# Patient Record
Sex: Female | Born: 1960 | Race: White | Hispanic: No | Marital: Married | Smoking: Never smoker
Health system: Southern US, Community
[De-identification: ages and names within clinical notes are randomized; demographics above are authoritative.]

## PROBLEM LIST (undated history)

## (undated) DIAGNOSIS — R002 Palpitations: Secondary | ICD-10-CM

## (undated) DIAGNOSIS — M810 Age-related osteoporosis without current pathological fracture: Secondary | ICD-10-CM

## (undated) DIAGNOSIS — Z1231 Encounter for screening mammogram for malignant neoplasm of breast: Secondary | ICD-10-CM

## (undated) DIAGNOSIS — Z0000 Encounter for general adult medical examination without abnormal findings: Secondary | ICD-10-CM

## (undated) DIAGNOSIS — E785 Hyperlipidemia, unspecified: Secondary | ICD-10-CM

## (undated) DIAGNOSIS — R7303 Prediabetes: Secondary | ICD-10-CM

## (undated) DIAGNOSIS — E039 Hypothyroidism, unspecified: Secondary | ICD-10-CM

## (undated) DIAGNOSIS — K219 Gastro-esophageal reflux disease without esophagitis: Secondary | ICD-10-CM

## (undated) DIAGNOSIS — G2581 Restless legs syndrome: Secondary | ICD-10-CM

## (undated) DIAGNOSIS — G4733 Obstructive sleep apnea (adult) (pediatric): Secondary | ICD-10-CM

## (undated) DIAGNOSIS — F419 Anxiety disorder, unspecified: Secondary | ICD-10-CM

## (undated) DIAGNOSIS — M1990 Unspecified osteoarthritis, unspecified site: Secondary | ICD-10-CM

## (undated) DIAGNOSIS — I10 Essential (primary) hypertension: Secondary | ICD-10-CM

## (undated) DIAGNOSIS — G4730 Sleep apnea, unspecified: Secondary | ICD-10-CM

## (undated) DIAGNOSIS — Z5189 Encounter for other specified aftercare: Secondary | ICD-10-CM

## (undated) HISTORY — DX: Anxiety disorder, unspecified: F41.9

## (undated) HISTORY — PX: BIOPSY RECTAL: PRO29

## (undated) HISTORY — DX: Unspecified osteoarthritis, unspecified site: M19.90

## (undated) HISTORY — DX: Hyperlipidemia, unspecified: E78.5

## (undated) HISTORY — DX: Encounter for other specified aftercare: Z51.89

## (undated) HISTORY — DX: Sleep apnea, unspecified: G47.30

## (undated) HISTORY — DX: Essential (primary) hypertension: I10

## (undated) HISTORY — DX: Gastro-esophageal reflux disease without esophagitis: K21.9

---

## 1991-07-15 HISTORY — PX: OVARIAN CYST SURGERY: SHX726

## 2004-08-06 ENCOUNTER — Ambulatory Visit: Payer: Self-pay | Admitting: Unknown Physician Specialty

## 2005-04-24 ENCOUNTER — Ambulatory Visit: Payer: Self-pay | Admitting: Otolaryngology

## 2005-09-15 ENCOUNTER — Ambulatory Visit: Payer: Self-pay | Admitting: Unknown Physician Specialty

## 2006-11-11 ENCOUNTER — Ambulatory Visit: Payer: Self-pay

## 2007-11-05 DIAGNOSIS — D239 Other benign neoplasm of skin, unspecified: Secondary | ICD-10-CM

## 2007-11-05 HISTORY — DX: Other benign neoplasm of skin, unspecified: D23.9

## 2007-12-02 ENCOUNTER — Ambulatory Visit: Payer: Self-pay

## 2008-12-14 ENCOUNTER — Ambulatory Visit: Payer: Self-pay | Admitting: Family Medicine

## 2009-10-26 DIAGNOSIS — L570 Actinic keratosis: Secondary | ICD-10-CM

## 2009-10-26 HISTORY — DX: Actinic keratosis: L57.0

## 2009-12-25 ENCOUNTER — Ambulatory Visit: Payer: Self-pay | Admitting: Family Medicine

## 2010-02-11 ENCOUNTER — Ambulatory Visit: Payer: Self-pay | Admitting: General Practice

## 2010-09-16 ENCOUNTER — Ambulatory Visit: Payer: Self-pay

## 2010-09-18 ENCOUNTER — Ambulatory Visit: Payer: Self-pay | Admitting: Family Medicine

## 2010-12-30 ENCOUNTER — Ambulatory Visit: Payer: Self-pay | Admitting: Family Medicine

## 2011-03-13 ENCOUNTER — Ambulatory Visit: Payer: Self-pay | Admitting: Otolaryngology

## 2011-04-01 ENCOUNTER — Ambulatory Visit: Payer: Self-pay | Admitting: Otolaryngology

## 2012-01-07 ENCOUNTER — Ambulatory Visit: Payer: Self-pay | Admitting: Family Medicine

## 2012-04-21 LAB — HM PAP SMEAR: HM PAP: NEGATIVE

## 2012-08-10 DIAGNOSIS — N949 Unspecified condition associated with female genital organs and menstrual cycle: Secondary | ICD-10-CM | POA: Insufficient documentation

## 2012-08-10 DIAGNOSIS — R350 Frequency of micturition: Secondary | ICD-10-CM | POA: Insufficient documentation

## 2012-08-10 DIAGNOSIS — N3020 Other chronic cystitis without hematuria: Secondary | ICD-10-CM | POA: Insufficient documentation

## 2012-08-23 ENCOUNTER — Other Ambulatory Visit: Payer: Self-pay | Admitting: Physician Assistant

## 2012-08-23 LAB — TSH: Thyroid Stimulating Horm: 1.13 u[IU]/mL

## 2012-08-25 ENCOUNTER — Ambulatory Visit: Payer: Self-pay | Admitting: General Practice

## 2012-12-31 ENCOUNTER — Ambulatory Visit: Payer: Self-pay | Admitting: Unknown Physician Specialty

## 2013-01-05 LAB — PATHOLOGY REPORT

## 2013-02-01 ENCOUNTER — Ambulatory Visit: Payer: Self-pay | Admitting: Family Medicine

## 2013-02-01 LAB — HM MAMMOGRAPHY

## 2013-02-02 DIAGNOSIS — Z8589 Personal history of malignant neoplasm of other organs and systems: Secondary | ICD-10-CM | POA: Insufficient documentation

## 2013-02-22 DIAGNOSIS — D3A8 Other benign neuroendocrine tumors: Secondary | ICD-10-CM | POA: Insufficient documentation

## 2013-03-17 ENCOUNTER — Ambulatory Visit: Payer: Self-pay | Admitting: Family Medicine

## 2013-05-06 ENCOUNTER — Ambulatory Visit: Payer: Self-pay | Admitting: Family Medicine

## 2013-07-12 DIAGNOSIS — C4491 Basal cell carcinoma of skin, unspecified: Secondary | ICD-10-CM

## 2013-07-12 HISTORY — DX: Basal cell carcinoma of skin, unspecified: C44.91

## 2013-07-14 DIAGNOSIS — B0221 Postherpetic geniculate ganglionitis: Secondary | ICD-10-CM

## 2013-07-14 HISTORY — DX: Postherpetic geniculate ganglionitis: B02.21

## 2013-11-14 LAB — BASIC METABOLIC PANEL
BUN: 14 mg/dL (ref 4–21)
CREATININE: 0.8 mg/dL (ref 0.5–1.1)
Glucose: 84 mg/dL
POTASSIUM: 4.2 mmol/L (ref 3.4–5.3)
SODIUM: 140 mmol/L (ref 137–147)

## 2013-11-14 LAB — LIPID PANEL
CHOLESTEROL: 196 mg/dL (ref 0–200)
HDL: 57 mg/dL (ref 35–70)
LDL Cholesterol: 120 mg/dL
TRIGLYCERIDES: 97 mg/dL (ref 40–160)

## 2013-11-14 LAB — TSH: TSH: 2.57 u[IU]/mL (ref 0.41–5.90)

## 2013-11-14 LAB — HEPATIC FUNCTION PANEL
ALT: 6 U/L — AB (ref 7–35)
AST: 12 U/L — AB (ref 13–35)

## 2014-03-24 ENCOUNTER — Ambulatory Visit: Payer: Self-pay | Admitting: Family Medicine

## 2014-10-11 ENCOUNTER — Encounter: Payer: Self-pay | Admitting: *Deleted

## 2015-01-19 ENCOUNTER — Other Ambulatory Visit: Payer: Self-pay | Admitting: Family Medicine

## 2015-01-19 DIAGNOSIS — E039 Hypothyroidism, unspecified: Secondary | ICD-10-CM

## 2015-01-19 MED ORDER — LEVOTHYROXINE SODIUM 112 MCG PO TABS: 112.0000 ug | ORAL_TABLET | Freq: Every day | ORAL | Status: DC

## 2015-01-22 ENCOUNTER — Other Ambulatory Visit: Payer: Self-pay | Admitting: Family Medicine

## 2015-01-22 DIAGNOSIS — E039 Hypothyroidism, unspecified: Secondary | ICD-10-CM

## 2015-01-22 MED ORDER — LEVOTHYROXINE SODIUM 112 MCG PO TABS: 112.0000 ug | ORAL_TABLET | Freq: Every day | ORAL | Status: DC

## 2015-03-23 ENCOUNTER — Other Ambulatory Visit: Payer: Self-pay | Admitting: Family Medicine

## 2015-03-29 ENCOUNTER — Other Ambulatory Visit: Payer: Self-pay | Admitting: Family Medicine

## 2015-03-29 DIAGNOSIS — Z1231 Encounter for screening mammogram for malignant neoplasm of breast: Secondary | ICD-10-CM

## 2015-04-03 ENCOUNTER — Ambulatory Visit
Admission: RE | Admit: 2015-04-03 | Discharge: 2015-04-03 | Disposition: A | Discharge status: Home / Self Care | Payer: Managed Care, Other (non HMO) | Source: Ambulatory Visit | Attending: Family Medicine | Admitting: Family Medicine

## 2015-04-03 DIAGNOSIS — R922 Inconclusive mammogram: Secondary | ICD-10-CM | POA: Diagnosis not present

## 2015-04-03 DIAGNOSIS — Z1231 Encounter for screening mammogram for malignant neoplasm of breast: Secondary | ICD-10-CM | POA: Diagnosis not present

## 2015-04-04 ENCOUNTER — Other Ambulatory Visit: Payer: Self-pay | Admitting: Family Medicine

## 2015-04-04 DIAGNOSIS — R928 Other abnormal and inconclusive findings on diagnostic imaging of breast: Secondary | ICD-10-CM

## 2015-04-09 ENCOUNTER — Ambulatory Visit
Admission: RE | Admit: 2015-04-09 | Discharge: 2015-04-09 | Disposition: A | Discharge status: Home / Self Care | Payer: Managed Care, Other (non HMO) | Source: Ambulatory Visit | Attending: Family Medicine | Admitting: Family Medicine

## 2015-04-09 ENCOUNTER — Other Ambulatory Visit: Payer: Self-pay

## 2015-04-09 DIAGNOSIS — R928 Other abnormal and inconclusive findings on diagnostic imaging of breast: Secondary | ICD-10-CM

## 2015-04-09 DIAGNOSIS — N6489 Other specified disorders of breast: Secondary | ICD-10-CM | POA: Diagnosis present

## 2015-04-09 DIAGNOSIS — N63 Unspecified lump in breast: Secondary | ICD-10-CM | POA: Diagnosis not present

## 2015-04-10 ENCOUNTER — Other Ambulatory Visit: Payer: Self-pay | Admitting: Family Medicine

## 2015-05-15 DIAGNOSIS — D3A8 Other benign neuroendocrine tumors: Secondary | ICD-10-CM

## 2015-05-15 DIAGNOSIS — E039 Hypothyroidism, unspecified: Secondary | ICD-10-CM | POA: Insufficient documentation

## 2015-05-15 DIAGNOSIS — M1990 Unspecified osteoarthritis, unspecified site: Secondary | ICD-10-CM

## 2015-05-15 DIAGNOSIS — G4730 Sleep apnea, unspecified: Secondary | ICD-10-CM

## 2015-05-15 DIAGNOSIS — G4700 Insomnia, unspecified: Secondary | ICD-10-CM

## 2015-05-15 DIAGNOSIS — K219 Gastro-esophageal reflux disease without esophagitis: Secondary | ICD-10-CM | POA: Insufficient documentation

## 2015-05-15 DIAGNOSIS — F419 Anxiety disorder, unspecified: Secondary | ICD-10-CM | POA: Insufficient documentation

## 2015-05-17 ENCOUNTER — Encounter: Payer: Self-pay | Admitting: *Deleted

## 2015-05-17 ENCOUNTER — Ambulatory Visit
Admission: RE | Admit: 2015-05-17 | Discharge: 2015-05-17 | Disposition: A | Discharge status: Home / Self Care | Payer: Managed Care, Other (non HMO) | Source: Ambulatory Visit | Attending: Internal Medicine | Admitting: Internal Medicine

## 2015-05-17 ENCOUNTER — Encounter: Payer: Self-pay | Admitting: Certified Registered Nurse Anesthetist

## 2015-05-17 ENCOUNTER — Encounter
Admission: RE | Disposition: A | Discharge status: Home / Self Care | Payer: Self-pay | Source: Ambulatory Visit | Attending: Internal Medicine

## 2015-05-17 DIAGNOSIS — Z8719 Personal history of other diseases of the digestive system: Secondary | ICD-10-CM | POA: Insufficient documentation

## 2015-05-17 DIAGNOSIS — Z79899 Other long term (current) drug therapy: Secondary | ICD-10-CM | POA: Diagnosis not present

## 2015-05-17 DIAGNOSIS — K219 Gastro-esophageal reflux disease without esophagitis: Secondary | ICD-10-CM | POA: Insufficient documentation

## 2015-05-17 DIAGNOSIS — Z09 Encounter for follow-up examination after completed treatment for conditions other than malignant neoplasm: Secondary | ICD-10-CM | POA: Diagnosis present

## 2015-05-17 DIAGNOSIS — E785 Hyperlipidemia, unspecified: Secondary | ICD-10-CM | POA: Insufficient documentation

## 2015-05-17 DIAGNOSIS — F419 Anxiety disorder, unspecified: Secondary | ICD-10-CM | POA: Diagnosis not present

## 2015-05-17 HISTORY — PX: EUS: SHX5427

## 2015-05-17 SURGERY — ULTRASOUND, LOWER GI TRACT, ENDOSCOPIC
Anesthesia: General

## 2015-05-17 NOTE — Op Note (Signed)
[facility] Patient Name: Jordan Baker Procedure Date: 05/17/2015 8:07 AM MRN: [id number] Account #: [id number] Date of Birth: 12/13/1960 Admit Type: Outpatient Age: 54 Room: [facility] ENDO ROOM 3 Gender: Female Note Status: Finalized Procedure:         Lower EUS Indications:       Rectal mucosal mass found on flex sig/colonoscopy: history                     of rectal neuroendocrine tumor s/p EMR 2013, For planned                     annual surveillance examination Patient Profile:   Refer to note in patient chart for documentation of                     history and physical. Providers:         Murray Hodgkins. Elvi Leventhal Referring MD:      Jerrell Belfast, MD (Referring MD), Manya Silvas, MD                     (Referring MD) Medicines:         None Complications:     No immediate complications. Procedure:         Pre-Anesthesia Assessment:                    Prior to the procedure, a History and Physical was                     performed, and patient medications and allergies were                     reviewed. The patient is competent. The risks and benefits                     of the procedure and the sedation options and risks were                     discussed with the patient. All questions were answered                     and informed consent was obtained. Patient identification                     and proposed procedure were verified by the physician and                     the nurse in the pre-procedure area. Mental Status                     Examination: alert and oriented. Airway Examination:                     normal oropharyngeal airway and neck mobility. Respiratory                     Examination: clear to auscultation. CV Examination:                     normal. Prophylactic Antibiotics: The patient does not                     require prophylactic antibiotics. Prior Anticoagulants:  The patient has taken no  previous anticoagulant or                     antiplatelet agents. ASA Grade Assessment: II - A patient                     with mild systemic disease. After reviewing the risks and                     benefits, the patient was deemed in satisfactory condition                     to undergo the procedure. The anesthesia plan was to use                     no sedation or anesthesia. Immediately prior to                     administration of medications, the patient was re-assessed                     for adequacy to receive sedatives. The heart rate,                     respiratory rate, oxygen saturations, blood pressure,                     adequacy of pulmonary ventilation, and response to care                     were monitored throughout the procedure. The physical                     status of the patient was re-assessed after the procedure.                    After obtaining informed consent, the endoscope was passed                     under direct vision. Throughout the procedure, the                     patient's blood pressure, pulse, and oxygen saturations                     were monitored continuously. The Endoscope was introduced                     through the anus and advanced to the the sigmoid colon.                     The EUS GI Radial Array [id number] was introduced through the                     anus and advanced to the the sigmoid colon for ultrasound.                     The lower EUS was accomplished without difficulty. The                     patient tolerated the procedure well. The quality of the                     bowel preparation was good. Findings:  The perianal and digital rectal examinations were normal.      Endoscopic Finding :      The rectum, recto-sigmoid colon, sigmoid colon and rectum (on       retroflexion) appeared normal.      Endosonographic Finding :      The anal canal was normal.      The rectum was normal.      The perirectal space was  normal.      No lymph nodes were seen in the perirectal region. Impression:        Flexible Sigmoidoscopy Impressions:                    - The rectum, recto-sigmoid colon and sigmoid colon are                     normal.                    Rectal EUS Impressions:                    - Endosonographic images of the anal canal were                     unremarkable.                    - Endosonographic images of the rectum were unremarkable.                     No evidence of recurrent neuroendocrine tumor.                    - Endosonographic images of the perirectal space were                     unremarkable.                    - No lymph nodes were seen in the perirectal region during                     endosonographic examination.                    - No specimens collected. Recommendation:    - Discharge patient to home (ambulatory).                    - Repeat lower endoscopic ultrasound in 1 year for                     surveillance.                    - The findings and recommendations were discussed with the                     patient.                    - Return to referring physician as previously scheduled. Procedure Code(s): --- Professional ---                    [phone], Sigmoidoscopy, flexible; with endoscopic ultrasound                     examination Diagnosis Code(s): --- Professional ---  K62.89, Other specified diseases of anus and rectum CPT copyright 2014 American Medical Association. All rights reserved. The codes documented in this report are preliminary and upon coder review may  be revised to meet current compliance requirements. Attending Participation:      I personally performed the entire procedure without the assistance of a       fellow, resident or surgical assistant. [place],  05/17/2015 8:27:47 AM This report has been signed electronically. Number of Addenda: 0 Note Initiated On: 05/17/2015 8:07 AM      [facility]

## 2015-05-17 NOTE — OR Nursing (Signed)
Pt. Requested no anesthesia. Tolerated well.

## 2015-05-17 NOTE — H&P (Signed)
Jordan Baker is an 54 y.o. female.    Pre-procedure H&P for:  Surveillance of rectal neuroendocrine tumor   Past Medical History  Diagnosis Date  . Anxiety   . GERD (gastroesophageal reflux disease)   . Hyperlipidemia     Past Surgical History  Procedure Laterality Date  . Ovarian cyst surgery  1993  . Cesarean section  1990/1993    x2  . Biopsy rectal      Family History  Problem Relation Age of Onset  . Breast cancer Paternal Grandmother   . Cancer Paternal Grandmother     Breast Cancer  . Hyperlipidemia Mother   . Hypertension Mother   . Hypertension Sister   . Arthritis Maternal Grandfather    Social History:  reports that she has never smoked. She has never used smokeless tobacco. She reports that she drinks alcohol. She reports that she does not use illicit drugs.  Allergies:  Allergies  Allergen Reactions  . Nitrofurantoin Monohyd Macro Rash    Medications Prior to Admission  Medication Sig Dispense Refill  . ALPRAZolam (XANAX) 0.5 MG tablet Take 0.5 mg by mouth.    Marland Kitchen amitriptyline (ELAVIL) 10 MG tablet Take by mouth.    . cetirizine (ZYRTEC) 10 MG tablet Take by mouth.    . levonorgestrel-ethinyl estradiol (AVIANE) 0.1-20 MG-MCG tablet     . levonorgestrel-ethinyl estradiol (ENPRESSE,TRIVORA) tablet Take by mouth.    . levothyroxine (SYNTHROID, LEVOTHROID) 112 MCG tablet Take 1 tablet (112 mcg total) by mouth daily. 90 tablet 0  . Multiple Vitamin (MULTI-VITAMINS) TABS Take by mouth.    . Omega-3 1000 MG CAPS Take by mouth.    Marland Kitchen omeprazole (PRILOSEC) 20 MG capsule Take by mouth.    . pravastatin (PRAVACHOL) 40 MG tablet TAKE ONE TABLET BY MOUTH AT BEDTIME *TIME FOR OFFICE VISIT* 90 tablet 0  . tiZANidine (ZANAFLEX) 4 MG tablet Take 4 mg by mouth.    . Calcium Carbonate-Vitamin D (CALCIUM 500 + D) 500-125 MG-UNIT TABS Take by mouth.    . celecoxib (CELEBREX) 200 MG capsule Take by mouth.    . doxycycline (MONODOX) 100 MG capsule Take by mouth.    .  doxycycline (VIBRA-TABS) 100 MG tablet Take 100 mg by mouth.    . pregabalin (LYRICA) 50 MG capsule Take 50 mg by mouth.      No results found for this or any previous visit (from the past 48 hour(s)). No results found.  ROS  Blood pressure 150/77, pulse 86, temperature 98.7 F (37.1 C), temperature source Oral, resp. rate 20, height 5\' 2"  (1.575 m), weight 69.4 kg (153 lb), last menstrual period 05/17/2015. Physical Exam  Heart: RRR, no murmurs/rubs/gallops Resp: Clear to auscultation bilaterally Abd: soft, NT, ND, normoactive bowel sounds  Assessment/Plan 1. Plan for surveillance rectal endoscopic ultrasound  Tillie Rung 05/17/2015, 7:55 AM

## 2015-05-31 ENCOUNTER — Ambulatory Visit (INDEPENDENT_AMBULATORY_CARE_PROVIDER_SITE_OTHER): Payer: Managed Care, Other (non HMO) | Admitting: Physician Assistant

## 2015-05-31 ENCOUNTER — Encounter: Payer: Self-pay | Admitting: Physician Assistant

## 2015-05-31 VITALS — BP 150/80 | HR 81 | Temp 98.5°F | Resp 16 | Ht 62.0 in | Wt 152.6 lb

## 2015-05-31 DIAGNOSIS — E7800 Pure hypercholesterolemia, unspecified: Secondary | ICD-10-CM | POA: Diagnosis not present

## 2015-05-31 DIAGNOSIS — Z3041 Encounter for surveillance of contraceptive pills: Secondary | ICD-10-CM

## 2015-05-31 DIAGNOSIS — E039 Hypothyroidism, unspecified: Secondary | ICD-10-CM

## 2015-05-31 DIAGNOSIS — M62830 Muscle spasm of back: Secondary | ICD-10-CM | POA: Diagnosis not present

## 2015-05-31 DIAGNOSIS — F419 Anxiety disorder, unspecified: Secondary | ICD-10-CM

## 2015-05-31 DIAGNOSIS — M792 Neuralgia and neuritis, unspecified: Secondary | ICD-10-CM

## 2015-05-31 DIAGNOSIS — D259 Leiomyoma of uterus, unspecified: Secondary | ICD-10-CM | POA: Diagnosis not present

## 2015-05-31 DIAGNOSIS — K219 Gastro-esophageal reflux disease without esophagitis: Secondary | ICD-10-CM

## 2015-05-31 DIAGNOSIS — J302 Other seasonal allergic rhinitis: Secondary | ICD-10-CM

## 2015-05-31 DIAGNOSIS — D219 Benign neoplasm of connective and other soft tissue, unspecified: Secondary | ICD-10-CM | POA: Insufficient documentation

## 2015-05-31 DIAGNOSIS — M1990 Unspecified osteoarthritis, unspecified site: Secondary | ICD-10-CM | POA: Diagnosis not present

## 2015-05-31 DIAGNOSIS — N951 Menopausal and female climacteric states: Secondary | ICD-10-CM | POA: Insufficient documentation

## 2015-05-31 DIAGNOSIS — Z0000 Encounter for general adult medical examination without abnormal findings: Secondary | ICD-10-CM | POA: Diagnosis not present

## 2015-05-31 MED ORDER — LEVOTHYROXINE SODIUM 112 MCG PO TABS: 112.0000 ug | ORAL_TABLET | Freq: Every day | ORAL | Status: DC

## 2015-05-31 MED ORDER — CELECOXIB 200 MG PO CAPS: 200.0000 mg | ORAL_CAPSULE | Freq: Every day | ORAL | Status: AC

## 2015-05-31 MED ORDER — ALPRAZOLAM 0.5 MG PO TABS: 0.5000 mg | ORAL_TABLET | Freq: Every evening | ORAL | Status: DC | PRN

## 2015-05-31 MED ORDER — OMEPRAZOLE 20 MG PO CPDR: 20.0000 mg | DELAYED_RELEASE_CAPSULE | Freq: Every day | ORAL | Status: DC

## 2015-05-31 MED ORDER — AMITRIPTYLINE HCL 25 MG PO TABS: 25.0000 mg | ORAL_TABLET | Freq: Every day | ORAL | Status: DC

## 2015-05-31 MED ORDER — PRAVASTATIN SODIUM 40 MG PO TABS: 40.0000 mg | ORAL_TABLET | Freq: Every day | ORAL | Status: DC

## 2015-05-31 MED ORDER — LEVONORGESTREL-ETHINYL ESTRAD 0.1-20 MG-MCG PO TABS: 1.0000 | ORAL_TABLET | Freq: Every day | ORAL | Status: DC

## 2015-05-31 MED ORDER — FLUTICASONE PROPIONATE 50 MCG/ACT NA SUSP: 2.0000 | Freq: Every day | NASAL | Status: DC

## 2015-05-31 MED ORDER — CYCLOBENZAPRINE HCL 10 MG PO TABS: 10.0000 mg | ORAL_TABLET | ORAL | Status: DC | PRN

## 2015-05-31 NOTE — Progress Notes (Signed)
Patient: Jordan Baker, Female    DOB: 16-Jul-1960, 54 y.o.   MRN: [id number] Visit Date: 05/31/2015  Today's Provider: Mar Daring, PA-C   Chief Complaint  Patient presents with  . Annual Exam   Subjective:    Annual physical exam Jordan Baker is a 54 y.o. female who presents today for health maintenance and complete physical. She feels well. She reports exercising, a lot of walking 3 times a week. She reports she is sleeping well since patient got CPAP device. She is requesting refills of her medications.   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 She has had annual screening since this. She is followed by Dr. Tillie Rung. She recently underwent a rectal ultrasound November 2016.    Review of Systems  Constitutional: Negative.   HENT: Negative.   Eyes: Negative.   Respiratory: Negative.   Cardiovascular: Negative.   Gastrointestinal: Negative.   Endocrine: Negative.   Genitourinary: Negative.   Musculoskeletal: Positive for joint swelling.  Skin: Negative.   Allergic/Immunologic: Negative.   Neurological: Negative.   Hematological: Negative.   Psychiatric/Behavioral: The patient is nervous/anxious.     Social History She  reports that she has never smoked. She has never used smokeless tobacco. She reports that she drinks alcohol. She reports that she does not use illicit drugs. Social History   Social History  . Marital Status: Married    Spouse Name: N/A  . Number of Children: N/A  . Years of Education: N/A   Social History Main Topics  . Smoking status: Never Smoker   . Smokeless tobacco: Never Used  . Alcohol Use: Yes     Comment: Occasional  . Drug Use: No  . Sexual Activity: Not Asked   Other Topics Concern  . None   Social History Narrative    Patient Active Problem List   Diagnosis Date Noted  . Fibroid 05/31/2015  . Nerve pain 05/31/2015  . Hypercholesterolemia 05/31/2015  . Hot flash, menopausal 05/31/2015  . GERD (gastroesophageal reflux disease) 05/15/2015  . Arthritis 05/15/2015  . Hypothyroidism 05/15/2015  . Anxiety disorder 05/15/2015  . Sleep apnea 05/15/2015  . Insomnia 05/15/2015  . Neuroendocrine tumor 02/22/2013  . 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 Bladder infection, chronic 08/10/2012  . Female genital symptoms 08/10/2012    Past Surgical History  Procedure Laterality Date  . Ovarian cyst surgery  1993  . Cesarean section  1990/1993    x2  . Biopsy rectal    . Eus N/A 05/17/2015    Procedure: LOWER ENDOSCOPIC ULTRASOUND (EUS);  Surgeon: Holly Bodily, MD;  Location: [facility] ENDOSCOPY;  Service: Gastroenterology;  Laterality: N/A;    Family History  Family Status  Relation Status Death Age  . Paternal Grandmother Deceased 57    died from old age  . Mother Alive   . Father Deceased 7    Lung cancer  . Sister Alive   . Maternal Grandmother Deceased 77    died from old age  . Maternal Grandfather Deceased 27's    died from [place] heart failure.  . Paternal Grandfather Deceased 48    died from old age  . Sister Alive    Her family history includes Arthritis in her maternal grandfather; Breast cancer in her paternal grandmother; Cancer in her paternal grandmother; Hyperlipidemia in her mother; Hypertension in her mother  and sister.    Allergies  Allergen Reactions  . Nitrofurantoin Monohyd Macro Rash    Previous Medications   ALPRAZOLAM (XANAX) 0.5 MG TABLET    Take 0.5 mg by mouth.   AMITRIPTYLINE (ELAVIL) 25 MG TABLET       CALCIUM CARBONATE-VITAMIN D (CALCIUM 500 + D) 500-125 MG-UNIT TABS    Take by mouth.   CELECOXIB (CELEBREX) 200 MG CAPSULE    Take by mouth.   CETIRIZINE (ZYRTEC) 10 MG TABLET    Take by mouth.   CYCLOBENZAPRINE (FLEXERIL) 10 MG TABLET    Take by mouth.   DOXYCYCLINE (VIBRA-TABS)  100 MG TABLET    Take 100 mg by mouth.   LEVONORGESTREL-ETHINYL ESTRADIOL (AVIANE) 0.1-20 MG-MCG TABLET       LEVONORGESTREL-ETHINYL ESTRADIOL (ENPRESSE,TRIVORA) TABLET    Take by mouth.   LEVOTHYROXINE (SYNTHROID, LEVOTHROID) 112 MCG TABLET    Take 1 tablet (112 mcg total) by mouth daily.   MULTIPLE VITAMIN (MULTI-VITAMINS) TABS    Take by mouth.   NON FORMULARY    CPAP (Device) nightly   OMEGA-3 1000 MG CAPS    Take by mouth.   OMEPRAZOLE (PRILOSEC) 20 MG CAPSULE    Take by mouth.   PRAVASTATIN (PRAVACHOL) 40 MG TABLET    TAKE ONE TABLET BY MOUTH AT BEDTIME *TIME FOR OFFICE VISIT*   TIZANIDINE (ZANAFLEX) 4 MG TABLET    Take 4 mg by mouth.    Patient Care Team: Margarita Rana, MD as PCP - General (Family Medicine)     Objective:   Vitals: BP 150/80 mmHg  Pulse 81  Temp(Src) 98.5 F (36.9 C) (Oral)  Resp 16  Ht 5\' 2"  (1.575 m)  Wt 152 lb 9.6 oz (69.219 kg)  BMI 27.90 kg/m2  LMP 05/17/2015   Physical Exam  Constitutional: She is oriented to person, place, and time. She appears well-developed and well-nourished. No distress.  HENT:  Head: Normocephalic and atraumatic.  Right Ear: Hearing, tympanic membrane, external ear and ear canal normal.  Left Ear: Hearing, tympanic membrane, external ear and ear canal normal.  Nose: Mucosal edema present.  Mouth/Throat: Uvula is midline, oropharynx is clear and moist and mucous membranes are normal. No oropharyngeal exudate.  Eyes: Conjunctivae and EOM are normal. Pupils are equal, round, and reactive to light. Right eye exhibits no discharge. Left eye exhibits no discharge. No scleral icterus.  Neck: Normal range of motion. Neck supple. No JVD present. No tracheal deviation present. No thyromegaly present.  Cardiovascular: Normal rate, regular rhythm, normal heart sounds and intact distal pulses.  Exam reveals no gallop and no friction rub.   No murmur heard. Pulmonary/Chest: Effort normal and breath sounds normal. No respiratory distress.  She has no wheezes. She has no rales. She exhibits no tenderness.  Abdominal: Soft. Bowel sounds are normal. She exhibits no distension and no mass. There is no tenderness. There is no rebound and no guarding.  Musculoskeletal: Normal range of motion. She exhibits no edema or tenderness.  Lymphadenopathy:    She has no cervical adenopathy.  Neurological: She is alert and oriented to person, place, and time.  Skin: Skin is warm and dry. No rash noted. She is not diaphoretic.  Psychiatric: She has a normal mood and affect. Her behavior is normal. Judgment and thought content normal.  Vitals reviewed.    Depression Screen No flowsheet data found.    Assessment & Plan:     Routine Health Maintenance and Physical Exam  1. Annual physical  exam Physical exam today was normal. We will check labs as below. I will follow-up with her pending lab results. If all labs are within normal limits able I will see her back in one year for her repeat physical exam. - CBC with Differential - Comprehensive metabolic panel - Hemoglobin A1c - Lipid panel - TSH  2. Gastroesophageal reflux disease, esophagitis presence not specified Currently stable on current medical treatment plan. Continue Prilosec as directed. Medication was refilled as below today. - omeprazole (PRILOSEC) 20 MG capsule; Take 1 capsule (20 mg total) by mouth daily.  Dispense: 90 capsule; Refill: 3  3. Hypothyroidism, unspecified hypothyroidism type Currently stable on levothyroxine 112 g. We will check TSH levels again today. I will follow-up pending the results of the labs. Levothyroxine was refilled at current dose as below. If needed we will adjust dosage as necessary. - levothyroxine (SYNTHROID, LEVOTHROID) 112 MCG tablet; Take 1 tablet (112 mcg total) by mouth daily.  Dispense: 90 tablet; Refill: 3  4. Nerve pain This was secondary to nerve damage done during removal of a tooth. She takes amitriptyline for the nerve pain. She  states that she will take half a tab to one tablet at night to help with the nerve pain. This medication was refilled as below. - amitriptyline (ELAVIL) 25 MG tablet; Take 1 tablet (25 mg total) by mouth at bedtime.  Dispense: 90 tablet; Refill: 3  5. Uterine leiomyoma, unspecified location History of uterine fibroids. She currently takes oral contraceptives to help with heavy menses secondary to the fibroid. She has been stable with this current regimen dose. We will continue to monitor her for menopausal symptoms. At that time we will discontinue the oral contraceptives.  6. Hypercholesterolemia Currently stable on pravastatin 40 mg. I will check her cholesterol today. I will follow-up with her pending results. If results are stable I will see her back in one year for her repeat physical exam. - pravastatin (PRAVACHOL) 40 MG tablet; Take 1 tablet (40 mg total) by mouth daily.  Dispense: 90 tablet; Refill: 3 - Comprehensive metabolic panel - Lipid panel  7. Anxiety disorder, unspecified anxiety disorder type Currently stable with Xanax as needed. She mostly uses the Xanax to help her sleep at night. 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 This is refilled as below. I will follow-up with her in one year for repeat physical exam. She may call the office if symptoms worsen in the meantime. - ALPRAZolam (XANAX) 0.5 MG tablet; Take 1 tablet (0.5 mg total) by mouth at bedtime as needed for anxiety.  Dispense: 90 tablet; Refill: 3  8. Arthritis Currently stable with Celebrex. Continue current treatment. Medication was refilled as below. I will see her back in one year for repeat physical exam. She is to call the office if symptoms worsen in the meantime. - celecoxib (CELEBREX) 200 MG capsule; Take 1 capsule (200 mg total) by mouth daily.  Dispense: 90 capsule; Refill: 3  9. 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 This causes occasional back spasms that affect her sleep. She occasionally will use Flexeril as needed to help her sleep when this occurs. Flexeril was refilled as below. She is to call the office if this worsens in the meantime. - cyclobenzaprine (FLEXERIL) 10 MG tablet; Take 1 tablet (10 mg total) by mouth as needed for  muscle spasms.  Dispense: 30 tablet; Refill: 3  10. Encounter for surveillance of contraceptive pills Oral contraceptive uses for uterine fibroids and heavy menstrual cycles. This been stable and well controlled with the current oral contraceptive as below. This medication was refilled as below. I will follow-up with her in one year for her repeat physical exam. She is to call the office if she starts to develop any menopausal symptoms. - levonorgestrel-ethinyl estradiol (AVIANE) 0.1-20 MG-MCG tablet; Take 1 tablet by mouth daily.  Dispense: 3 Package; Refill: 3  11. Seasonal allergies Currently taking Zyrtec. She does have some nasal mucosa edema noted on exam. This is most likely secondary to seasonal allergies as well as use of her CPAP at night. I did advise her to start using Flonase to see if this helps the edema. She is to call the office if symptoms fail to improve or worsen. - fluticasone (FLONASE) 50 MCG/ACT nasal spray; Place 2 sprays into both nostrils daily.  Dispense: 16 g; Refill: 6  Exercise Activities and Dietary recommendations Goals    None       There is no immunization history on file for this patient.  Health Maintenance  Topic Date Due  . Hepatitis C Screening  [birth year]  . HIV Screening  02/08/1977  . TETANUS/TDAP  02/08/1981  . COLONOSCOPY  02/09/2012  . INFLUENZA VACCINE  02/12/2015  . PAP SMEAR  04/22/2015  . MAMMOGRAM  04/02/2017      Discussed health benefits of physical activity, and encouraged her to engage in regular exercise appropriate for her age and condition.     --------------------------------------------------------------------

## 2015-05-31 NOTE — Patient Instructions (Signed)
Health Maintenance, Female Adopting a healthy lifestyle and getting preventive care can go a long way to promote health and wellness. Talk with your health care provider about what schedule of regular examinations is right for you. This is a good chance for you to check in with your provider about disease prevention and staying healthy. In between checkups, there are plenty of things you can do on your own. Experts have done a lot of research about which lifestyle changes and preventive measures are most likely to keep you healthy. Ask your health care provider for more information. WEIGHT AND DIET  Eat a healthy diet  Be sure to include plenty of vegetables, fruits, low-fat dairy products, and lean protein.  Do not eat a lot of foods high in solid fats, added sugars, or salt.  Get regular exercise. This is one of the most important things you can do for your health.  Most adults should exercise for at least 150 minutes each week. The exercise should increase your heart rate and make you sweat (moderate-intensity exercise).  Most adults should also do strengthening exercises at least twice a week. This is in addition to the moderate-intensity exercise.  Maintain a healthy weight  Body mass index (BMI) is a measurement that can be used to identify possible weight problems. It estimates body fat based on height and weight. Your health care provider can help determine your BMI and help you achieve or maintain a healthy weight.  For females 20 years of age and older:   A BMI below 18.5 is considered underweight.  A BMI of 18.5 to 24.9 is normal.  A BMI of 25 to 29.9 is considered overweight.  A BMI of 30 and above is considered obese.  Watch levels of cholesterol and blood lipids  You should start having your blood tested for lipids and cholesterol at 53 years of age, then have this test every 5 years.  You may need to have your cholesterol levels checked more often if:  Your lipid  or cholesterol levels are high.  You are older than 53 years of age.  You are at high risk for heart disease.  CANCER SCREENING   Lung Cancer  Lung cancer screening is recommended for adults 55-80 years old who are at high risk for lung cancer because of a history of smoking.  A yearly low-dose CT scan of the lungs is recommended for people who:  Currently smoke.  Have quit within the past 15 years.  Have at least a 30-pack-year history of smoking. A pack year is smoking an average of one pack of cigarettes a day for 1 year.  Yearly screening should continue until it has been 15 years since you quit.  Yearly screening should stop if you develop a health problem that would prevent you from having lung cancer treatment.  Breast Cancer  Practice breast self-awareness. This means understanding how your breasts normally appear and feel.  It also means doing regular breast self-exams. Let your health care provider know about any changes, no matter how small.  If you are in your 20s or 30s, you should have a clinical breast exam (CBE) by a health care provider every 1-3 years as part of a regular health exam.  If you are 40 or older, have a CBE every year. Also consider having a breast X-ray (mammogram) every year.  If you have a family history of breast cancer, talk to your health care provider about genetic screening.  If you   are at high risk for breast cancer, talk to your health care provider about having an MRI and a mammogram every year.  Breast cancer gene (BRCA) assessment is recommended for women who have family members with BRCA-related cancers. BRCA-related cancers include:  Breast.  Ovarian.  Tubal.  Peritoneal cancers.  Results of the assessment will determine the need for genetic counseling and BRCA1 and BRCA2 testing. Cervical Cancer Your health care provider may recommend that you be screened regularly for cancer of the pelvic organs (ovaries, uterus, and  vagina). This screening involves a pelvic examination, including checking for microscopic changes to the surface of your cervix (Pap test). You may be encouraged to have this screening done every 3 years, beginning at age 21.  For women ages 30-65, health care providers may recommend pelvic exams and Pap testing every 3 years, or they may recommend the Pap and pelvic exam, combined with testing for human papilloma virus (HPV), every 5 years. Some types of HPV increase your risk of cervical cancer. Testing for HPV may also be done on women of any age with unclear Pap test results.  Other health care providers may not recommend any screening for nonpregnant women who are considered low risk for pelvic cancer and who do not have symptoms. Ask your health care provider if a screening pelvic exam is right for you.  If you have had past treatment for cervical cancer or a condition that could lead to cancer, you need Pap tests and screening for cancer for at least 20 years after your treatment. If Pap tests have been discontinued, your risk factors (such as having a new sexual partner) need to be reassessed to determine if screening should resume. Some women have medical problems that increase the chance of getting cervical cancer. In these cases, your health care provider may recommend more frequent screening and Pap tests. Colorectal Cancer  This type of cancer can be detected and often prevented.  Routine colorectal cancer screening usually begins at 53 years of age and continues through 53 years of age.  Your health care provider may recommend screening at an earlier age if you have risk factors for colon cancer.  Your health care provider may also recommend using home test kits to check for hidden blood in the stool.  A small camera at the end of a tube can be used to examine your colon directly (sigmoidoscopy or colonoscopy). This is done to check for the earliest forms of colorectal  cancer.  Routine screening usually begins at age 50.  Direct examination of the colon should be repeated every 5-10 years through 53 years of age. However, you may need to be screened more often if early forms of precancerous polyps or small growths are found. Skin Cancer  Check your skin from head to toe regularly.  Tell your health care provider about any new moles or changes in moles, especially if there is a change in a mole's shape or color.  Also tell your health care provider if you have a mole that is larger than the size of a pencil eraser.  Always use sunscreen. Apply sunscreen liberally and repeatedly throughout the day.  Protect yourself by wearing long sleeves, pants, a wide-brimmed hat, and sunglasses whenever you are outside. HEART DISEASE, DIABETES, AND HIGH BLOOD PRESSURE   High blood pressure causes heart disease and increases the risk of stroke. High blood pressure is more likely to develop in:  People who have blood pressure in the high end   of the normal range (130-139/85-89 mm Hg).  People who are overweight or obese.  People who are African American.  If you are 38-23 years of age, have your blood pressure checked every 3-5 years. If you are 61 years of age or older, have your blood pressure checked every year. You should have your blood pressure measured twice--once when you are at a hospital or clinic, and once when you are not at a hospital or clinic. Record the average of the two measurements. To check your blood pressure when you are not at a hospital or clinic, you can use:  An automated blood pressure machine at a pharmacy.  A home blood pressure monitor.  If you are between 45 years and 39 years old, ask your health care provider if you should take aspirin to prevent strokes.  Have regular diabetes screenings. This involves taking a blood sample to check your fasting blood sugar level.  If you are at a normal weight and have a low risk for diabetes,  have this test once every three years after 53 years of age.  If you are overweight and have a high risk for diabetes, consider being tested at a younger age or more often. PREVENTING INFECTION  Hepatitis B  If you have a higher risk for hepatitis B, you should be screened for this virus. You are considered at high risk for hepatitis B if:  You were born in a country where hepatitis B is common. Ask your health care provider which countries are considered high risk.  Your parents were born in a high-risk country, and you have not been immunized against hepatitis B (hepatitis B vaccine).  You have HIV or AIDS.  You use needles to inject street drugs.  You live with someone who has hepatitis B.  You have had sex with someone who has hepatitis B.  You get hemodialysis treatment.  You take certain medicines for conditions, including cancer, organ transplantation, and autoimmune conditions. Hepatitis C  Blood testing is recommended for:  Everyone born from [birth year] through 1964.  Anyone with known risk factors for hepatitis C. Sexually transmitted infections (STIs)  You should be screened for sexually transmitted infections (STIs) including gonorrhea and chlamydia if:  You are sexually active and are younger than 53 years of age.  You are older than 53 years of age and your health care provider tells you that you are at risk for this type of infection.  Your sexual activity has changed since you were last screened and you are at an increased risk for chlamydia or gonorrhea. Ask your health care provider if you are at risk.  If you do not have HIV, but are at risk, it may be recommended that you take a prescription medicine daily to prevent HIV infection. This is called pre-exposure prophylaxis (PrEP). You are considered at risk if:  You are sexually active and do not regularly use condoms or know the HIV status of your partner(s).  You take drugs by injection.  You are sexually  active with a partner who has HIV. Talk with your health care provider about whether you are at high risk of being infected with HIV. If you choose to begin PrEP, you should first be tested for HIV. You should then be tested every 3 months for as long as you are taking PrEP.  PREGNANCY   If you are premenopausal and you may become pregnant, ask your health care provider about preconception counseling.  If you may  become pregnant, take 400 to 800 micrograms (mcg) of folic acid every day.  If you want to prevent pregnancy, talk to your health care provider about birth control (contraception). OSTEOPOROSIS AND MENOPAUSE   Osteoporosis is a disease in which the bones lose minerals and strength with aging. This can result in serious bone fractures. Your risk for osteoporosis can be identified using a bone density scan.  If you are 61 years of age or older, or if you are at risk for osteoporosis and fractures, ask your health care provider if you should be screened.  Ask your health care provider whether you should take a calcium or vitamin D supplement to lower your risk for osteoporosis.  Menopause may have certain physical symptoms and risks.  Hormone replacement therapy may reduce some of these symptoms and risks. Talk to your health care provider about whether hormone replacement therapy is right for you.  HOME CARE INSTRUCTIONS   Schedule regular health, dental, and eye exams.  Stay current with your immunizations.   Do not use any tobacco products including cigarettes, chewing tobacco, or electronic cigarettes.  If you are pregnant, do not drink alcohol.  If you are breastfeeding, limit how much and how often you drink alcohol.  Limit alcohol intake to no more than 1 drink per day for nonpregnant women. One drink equals 12 ounces of beer, 5 ounces of wine, or 1 ounces of hard liquor.  Do not use street drugs.  Do not share needles.  Ask your health care provider for help if  you need support or information about quitting drugs.  Tell your health care provider if you often feel depressed.  Tell your health care provider if you have ever been abused or do not feel safe at home.   This information is not intended to replace advice given to you by your health care provider. Make sure you discuss any questions you have with your health care provider.   Document Released: 01/13/2011 Document Revised: 07/21/2014 Document Reviewed: 06/01/2013 Elsevier Interactive Patient Education [employer].

## 2015-06-01 MED ORDER — CYCLOBENZAPRINE HCL 10 MG PO TABS
10.0000 mg | ORAL_TABLET | Freq: Three times a day (TID) | ORAL | Status: DC | PRN
Start: 2015-06-01 — End: 2017-07-23

## 2015-06-01 NOTE — Addendum Note (Signed)
Addended by: Mar Daring on: 06/01/2015 04:30 PM   Modules accepted: Orders

## 2015-06-06 LAB — CBC WITH DIFFERENTIAL/PLATELET
BASOS: 1 %
Basophils Absolute: 0 10*3/uL (ref 0.0–0.2)
EOS (ABSOLUTE): 0.1 10*3/uL (ref 0.0–0.4)
Eos: 2 %
Hematocrit: 36.8 % (ref 34.0–46.6)
Hemoglobin: 12.3 g/dL (ref 11.1–15.9)
Immature Grans (Abs): 0 10*3/uL (ref 0.0–0.1)
Immature Granulocytes: 0 %
Lymphocytes Absolute: 1.6 10*3/uL (ref 0.7–3.1)
Lymphs: 29 %
MCH: 29.1 pg (ref 26.6–33.0)
MCHC: 33.4 g/dL (ref 31.5–35.7)
MCV: 87 fL (ref 79–97)
MONOS ABS: 0.3 10*3/uL (ref 0.1–0.9)
Monocytes: 5 %
NEUTROS ABS: 3.4 10*3/uL (ref 1.4–7.0)
NEUTROS PCT: 63 %
PLATELETS: 328 10*3/uL (ref 150–379)
RBC: 4.22 x10E6/uL (ref 3.77–5.28)
RDW: 13.5 % (ref 12.3–15.4)
WBC: 5.4 10*3/uL (ref 3.4–10.8)

## 2015-06-06 LAB — COMPREHENSIVE METABOLIC PANEL
A/G RATIO: 1.8 (ref 1.1–2.5)
ALT: 10 IU/L (ref 0–32)
AST: 14 IU/L (ref 0–40)
Albumin: 4.3 g/dL (ref 3.5–5.5)
Alkaline Phosphatase: 66 IU/L (ref 39–117)
BILIRUBIN TOTAL: 0.3 mg/dL (ref 0.0–1.2)
BUN/Creatinine Ratio: 15 (ref 9–23)
BUN: 12 mg/dL (ref 6–24)
CO2: 23 mmol/L (ref 18–29)
Calcium: 9.1 mg/dL (ref 8.7–10.2)
Chloride: 100 mmol/L (ref 97–106)
Creatinine, Ser: 0.82 mg/dL (ref 0.57–1.00)
GFR calc Af Amer: 94 mL/min/{1.73_m2} (ref >59)
GFR calc non Af Amer: 82 mL/min/{1.73_m2} (ref >59)
Globulin, Total: 2.4 g/dL (ref 1.5–4.5)
Glucose: 84 mg/dL (ref 65–99)
POTASSIUM: 4.1 mmol/L (ref 3.5–5.2)
Sodium: 138 mmol/L (ref 136–144)
Total Protein: 6.7 g/dL (ref 6.0–8.5)

## 2015-06-06 LAB — LIPID PANEL
CHOL/HDL RATIO: 4.2 ratio (ref 0.0–4.4)
Cholesterol, Total: 232 mg/dL — ABNORMAL HIGH (ref 100–199)
HDL: 55 mg/dL (ref >39)
LDL CALC: 160 mg/dL — AB (ref 0–99)
TRIGLYCERIDES: 84 mg/dL (ref 0–149)
VLDL Cholesterol Cal: 17 mg/dL (ref 5–40)

## 2015-06-06 LAB — HEMOGLOBIN A1C
Est. average glucose Bld gHb Est-mCnc: 120 mg/dL
HEMOGLOBIN A1C: 5.8 % — AB (ref 4.8–5.6)

## 2015-06-06 LAB — TSH: TSH: 5.71 u[IU]/mL — AB (ref 0.450–4.500)

## 2015-06-08 ENCOUNTER — Telehealth: Payer: Self-pay

## 2015-06-08 DIAGNOSIS — E039 Hypothyroidism, unspecified: Secondary | ICD-10-CM

## 2015-06-08 MED ORDER — LEVOTHYROXINE SODIUM 125 MCG PO TABS: 125.0000 ug | ORAL_TABLET | Freq: Every day | ORAL | Status: DC

## 2015-06-08 NOTE — Telephone Encounter (Signed)
-----   Message from Mar Daring, [place] sent at 06/06/2015  4:45 PM EST ----- Blood count, kidney and liver function were all WNL and stable.  Cholesterol is borderline elevated at 232, LDL is 160.  Try to limit high fat, high cholesterol foods in diet.  Continue fish oil.  Add daily exercise for 30-40 minutes daily x 3-4 days per week.  Make sure to be taking pravastatin daily as well.  Hemoglobin A1c is also borderline elevated at 5.8.  Limit carbohydrates and sugars in diet as well. We will recheck labs in one year.  Form for work has been filled out as well and placed up front for pick up.

## 2015-06-08 NOTE — Telephone Encounter (Signed)
It was only borderline high but since she is symptomatic we can increase the dose to 118mcg.  I have sent this new Rx to [facility] [address].  We can recheck her tsh in 3 months to see how the level is at that time.  Order for future TSh has been placed as well.

## 2015-06-08 NOTE — Telephone Encounter (Signed)
Patient advised as directed below. Per patient wants to know if she needs to increase her thyroid medicines because her Thyroid level is a little high.Patient went to [facility] and saw her results. Per patient feeling tired, hair is falling out, feels constipated, and feels like is gaining weight. Per patient this usually happens when her thyroid levels goes high.  Please advise.  Thanks,  -Joseline

## 2015-06-11 NOTE — Telephone Encounter (Signed)
Patient advised as directed below. Patient advised to called before going to the lab.  Thanks,  -Kazuo Durnil

## 2015-07-18 ENCOUNTER — Ambulatory Visit: Payer: Self-pay | Admitting: Physician Assistant

## 2015-07-18 ENCOUNTER — Encounter: Payer: Self-pay | Admitting: Physician Assistant

## 2015-07-18 VITALS — BP 140/78 | Temp 98.3°F

## 2015-07-18 DIAGNOSIS — R300 Dysuria: Secondary | ICD-10-CM

## 2015-07-18 DIAGNOSIS — N390 Urinary tract infection, site not specified: Secondary | ICD-10-CM

## 2015-07-18 DIAGNOSIS — Z299 Encounter for prophylactic measures, unspecified: Secondary | ICD-10-CM

## 2015-07-18 LAB — POCT URINALYSIS DIPSTICK
BILIRUBIN UA: NEGATIVE
GLUCOSE UA: NEGATIVE
KETONES UA: NEGATIVE
Nitrite, UA: POSITIVE
Protein, UA: NEGATIVE
SPEC GRAV UA: 1.015
UROBILINOGEN UA: 0.2
pH, UA: 7

## 2015-07-18 MED ORDER — FLUCONAZOLE 150 MG PO TABS: 150.0000 mg | ORAL_TABLET | Freq: Once | ORAL | Status: DC

## 2015-07-18 MED ORDER — LEVOFLOXACIN 500 MG PO TABS: 500.0000 mg | ORAL_TABLET | Freq: Every day | ORAL | Status: DC

## 2015-07-18 MED ORDER — PHENAZOPYRIDINE HCL 200 MG PO TABS: 200.0000 mg | ORAL_TABLET | Freq: Three times a day (TID) | ORAL | Status: DC | PRN

## 2015-07-18 NOTE — Progress Notes (Signed)
S/ acute onset dysuria , left flank pain , myalgias , fatigue   Denies nausea or vomiting , hx of recurrant uti's followed by urology   O/ VSS pleasant and uncomfortable appearing ,not toxic   Abd left CVA  U/a dip with + nitrites and leuks  A/ UTI  P hydration. rx levaquin 500 mg  QD x 7 ,diflucan 150 mg for prn ,pyridium 200 mg for prn Signs and sxs reviewed to seek urgent care.

## 2015-07-23 ENCOUNTER — Ambulatory Visit: Payer: Self-pay | Admitting: Physician Assistant

## 2015-07-23 ENCOUNTER — Encounter: Payer: Self-pay | Admitting: Physician Assistant

## 2015-07-23 VITALS — BP 145/80 | HR 116 | Temp 98.6°F

## 2015-07-23 DIAGNOSIS — B3749 Other urogenital candidiasis: Secondary | ICD-10-CM

## 2015-07-23 DIAGNOSIS — M549 Dorsalgia, unspecified: Secondary | ICD-10-CM

## 2015-07-23 LAB — POCT URINALYSIS DIPSTICK
Bilirubin, UA: NEGATIVE
Blood, UA: NEGATIVE
GLUCOSE UA: NEGATIVE
Ketones, UA: NEGATIVE
Leukocytes, UA: NEGATIVE
Nitrite, UA: NEGATIVE
Protein, UA: NEGATIVE
SPEC GRAV UA: 1.01
UROBILINOGEN UA: 0.2
pH, UA: 6

## 2015-07-23 NOTE — Progress Notes (Signed)
S: here for recheck of urine, states was feeling a lot better but now starting to feel bad again , some low back pain, no fever/chills  O: vitals wnl, nad, lungs c t a, cv rrr, ua wnl  A: resolved uti  P: f/u with gyn

## 2016-02-04 ENCOUNTER — Ambulatory Visit: Payer: Self-pay | Admitting: Registered Nurse

## 2016-02-04 ENCOUNTER — Encounter: Payer: Self-pay | Admitting: Registered Nurse

## 2016-02-04 VITALS — BP 140/90 | HR 84 | Temp 98.5°F

## 2016-02-04 DIAGNOSIS — B373 Candidiasis of vulva and vagina: Secondary | ICD-10-CM

## 2016-02-04 DIAGNOSIS — B3731 Acute candidiasis of vulva and vagina: Secondary | ICD-10-CM

## 2016-02-04 DIAGNOSIS — R300 Dysuria: Secondary | ICD-10-CM

## 2016-02-04 LAB — POCT URINALYSIS DIPSTICK
Bilirubin, UA: NEGATIVE
Blood, UA: NEGATIVE
Glucose, UA: NEGATIVE
KETONES UA: NEGATIVE
LEUKOCYTES UA: NEGATIVE
NITRITE UA: NEGATIVE
PH UA: 5.5
PROTEIN UA: NEGATIVE
UROBILINOGEN UA: 0.2

## 2016-02-04 MED ORDER — METRONIDAZOLE 0.75 % VA GEL: 1.0000 | Freq: Every day | VAGINAL | Status: AC

## 2016-02-04 MED ORDER — SULFAMETHOXAZOLE-TRIMETHOPRIM 800-160 MG PO TABS: 1.0000 | ORAL_TABLET | Freq: Two times a day (BID) | ORAL | 0 refills | Status: DC

## 2016-02-04 NOTE — Progress Notes (Signed)
Subjective:    Patient ID: Jordan Baker, female    DOB: 04/14/1961, 55 y.o.   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  Thinks another yeast infection has been brewing sex this weekend took doxycycline as prescribed for prn sexual intercourse but frequency, abdomen discomfort and burning with urination worsening today.  AC out in her workcenter has been sweating a lot today and hydrating as temperatures in 80s in workcenter.  PMHx uterine fibroids, UTIs, hypothyroidism, anxiety   Urinary Tract Infection   This is a recurrent problem. The current episode started in the past 7 days. The problem occurs every urination. The problem has been gradually worsening. The quality of the pain is described as burning. The pain is mild. There has been no fever. She is sexually active. There is no history of pyelonephritis. Associated symptoms include frequency, sweats and urgency. Pertinent negatives include no chills, discharge, flank pain, hematuria, hesitancy, nausea, possible pregnancy or vomiting. She has tried increased fluids for the symptoms. The treatment provided no relief. Her past medical history is significant for recurrent UTIs. There is no history of catheterization, kidney stones, a single kidney, urinary stasis or a urological procedure.      Review of Systems  Constitutional: Positive for diaphoresis. Negative for activity change, appetite change, chills and fever.  HENT: Negative for congestion, mouth sores, nosebleeds and rhinorrhea.   Eyes: Negative for pain, discharge, redness and itching.  Respiratory: Negative for cough and wheezing.   Cardiovascular: Negative for chest pain.  Gastrointestinal: Positive for abdominal pain. Negative for constipation, diarrhea, nausea and vomiting.  Endocrine: Negative for polydipsia, polyphagia and polyuria.   Genitourinary: Positive for dysuria, frequency, urgency and vaginal discharge. Negative for decreased urine volume, difficulty urinating, flank pain, hematuria, hesitancy, menstrual problem, vaginal bleeding and vaginal pain.  Musculoskeletal: Negative for gait problem.  Allergic/Immunologic: Positive for environmental allergies. Negative for food allergies.  Neurological: Negative for dizziness, tremors, weakness, light-headedness and headaches.  Hematological: Negative for adenopathy. Does not bruise/bleed easily.  Psychiatric/Behavioral: Negative for sleep disturbance.       Objective:   Physical Exam  Constitutional: She is oriented to person, place, and time. She appears well-developed and well-nourished. She is active and cooperative.  Non-toxic appearance. She does not have a sickly appearance. She does not appear ill. No distress.  HENT:  Head: Normocephalic and atraumatic.  Right Ear: Hearing, external ear and ear canal normal.  Left Ear: Hearing, external ear and ear canal normal.  Nose: Nose normal. No mucosal edema, rhinorrhea, nose lacerations, sinus tenderness, nasal deformity, septal deviation or nasal septal hematoma. No epistaxis.  No foreign bodies.  Mouth/Throat: Uvula is midline and mucous membranes are normal. Mucous membranes are not pale, not dry and not cyanotic. She does not have dentures. No oral lesions. No trismus in the jaw. Normal dentition. No dental abscesses, uvula swelling, lacerations or dental caries. Posterior oropharyngeal edema and posterior oropharyngeal erythema present. No oropharyngeal exudate or tonsillar abscesses.  Eyes: Conjunctivae, EOM and lids are normal. Pupils are equal, round, and reactive to light. Right eye exhibits no chemosis, no discharge, no exudate and no hordeolum. No foreign body present in the right eye. Left eye exhibits no chemosis, no discharge, no exudate and no hordeolum. No foreign body present in the left eye. Right conjunctiva  is not injected. Right conjunctiva has no hemorrhage. Left  conjunctiva is not injected. Left conjunctiva has no hemorrhage. No scleral icterus. Right eye exhibits normal extraocular motion and no nystagmus. Left eye exhibits normal extraocular motion and no nystagmus. Right pupil is round and reactive. Left pupil is round and reactive. Pupils are equal.  Neck: Trachea normal and normal range of motion. Neck supple. No tracheal tenderness, no spinous process tenderness and no muscular tenderness present. No neck rigidity. No tracheal deviation, no edema, no erythema and normal range of motion present. No thyroid mass and no thyromegaly present.  Cardiovascular: Normal rate, regular rhythm, S1 normal, S2 normal, normal heart sounds and intact distal pulses.  PMI is not displaced.  Exam reveals no gallop and no friction rub.   No murmur heard. Pulmonary/Chest: Effort normal and breath sounds normal. No accessory muscle usage or stridor. No respiratory distress. She has no decreased breath sounds. She has no wheezes. She has no rhonchi. She has no rales. She exhibits no tenderness.  Abdominal: Soft. Normal appearance and bowel sounds are normal. She exhibits no shifting dullness, no distension, no pulsatile liver, no fluid wave, no abdominal bruit, no ascites, no pulsatile midline mass and no mass. There is no hepatosplenomegaly. There is tenderness in the left lower quadrant. There is no rigidity, no rebound, no guarding, no CVA tenderness, no tenderness at McBurney's point and negative Murphy's sign. Hernia confirmed negative in the ventral area.  Musculoskeletal: Normal range of motion. She exhibits no edema or tenderness.       Right shoulder: Normal.       Left shoulder: Normal.       Right hip: Normal.       Left hip: Normal.       Right knee: Normal.       Left knee: Normal.       Cervical back: Normal.       Right hand: Normal.       Left hand: Normal.  Lymphadenopathy:    She has no cervical  adenopathy.       Right cervical: No superficial cervical, no deep cervical and no posterior cervical adenopathy present.      Left cervical: No superficial cervical, no deep cervical and no posterior cervical adenopathy present.  Neurological: She is alert and oriented to person, place, and time. She has normal strength. She is not disoriented. She displays no atrophy and no tremor. No cranial nerve deficit or sensory deficit. She exhibits normal muscle tone. She displays no seizure activity. Coordination and gait normal. GCS eye subscore is 4. GCS verbal subscore is 5. GCS motor subscore is 6.  Skin: Skin is warm, dry and intact. No abrasion, no bruising, no burn, no ecchymosis, no laceration, no lesion, no petechiae and no rash noted. She is not diaphoretic. No cyanosis or erythema. No pallor. Nails show no clubbing.  Psychiatric: She has a normal mood and affect. Her speech is normal and behavior is normal. Judgment and thought content normal. Cognition and memory are normal.  Nursing note and vitals reviewed.         Assessment & Plan:  A-cystitis and vaginal yeast  P-Medications as directed. Start metronidazole vaginal 1 applicatorful per vagina bedtime x 5 days, cotton underwear, yogurt/active cultures intake daily, avoid douching, change sweat soaked clothes as soon as possible.  Continue doxycycline po prn sexual intercourse, shower after sex.  May use OTC Azo po prn manufacturer's instructions.  Rx bactrim DS po BID x 7 days given to patient if worsening dysuria/hematuria/cloudy/smelly/back pain  to start.   Patient is also to push fluids.  Hydrate, avoid dehydration.  Avoid holding urine void on frequent basis every 4 to 6 hours.  If unable to void every 8 hours follow up for re-evaluation with PCM, urgent care or ER.   Call or return to clinic as needed if these symptoms worsen or fail to improve as anticipated. Consider follow up with urology as frequent recurrent UTIs.  Discussed black  box warning ciprofloxacin and levaquin with patient and possible interaction celebrex and bactrim could raise celebrex levels patient taking 200mg  celebrex max per day.  Patient verbalized agreement and understanding of treatment plan and had no further questions at this time. P2:  Hydrate and cranberry juice

## 2016-02-27 ENCOUNTER — Other Ambulatory Visit: Payer: Self-pay | Admitting: Physician Assistant

## 2016-02-27 DIAGNOSIS — Z1231 Encounter for screening mammogram for malignant neoplasm of breast: Secondary | ICD-10-CM

## 2016-04-07 ENCOUNTER — Ambulatory Visit
Admission: RE | Admit: 2016-04-07 | Discharge: 2016-04-07 | Disposition: A | Discharge status: Home / Self Care | Payer: Managed Care, Other (non HMO) | Source: Ambulatory Visit | Attending: Physician Assistant | Admitting: Physician Assistant

## 2016-04-07 ENCOUNTER — Other Ambulatory Visit: Payer: Self-pay | Admitting: Physician Assistant

## 2016-04-07 DIAGNOSIS — Z1231 Encounter for screening mammogram for malignant neoplasm of breast: Secondary | ICD-10-CM | POA: Diagnosis present

## 2016-05-17 ENCOUNTER — Other Ambulatory Visit: Payer: Self-pay | Admitting: Physician Assistant

## 2016-05-17 DIAGNOSIS — K219 Gastro-esophageal reflux disease without esophagitis: Secondary | ICD-10-CM

## 2016-06-02 ENCOUNTER — Ambulatory Visit (INDEPENDENT_AMBULATORY_CARE_PROVIDER_SITE_OTHER): Payer: Managed Care, Other (non HMO) | Admitting: Physician Assistant

## 2016-06-02 ENCOUNTER — Other Ambulatory Visit: Payer: Self-pay | Admitting: Physician Assistant

## 2016-06-02 ENCOUNTER — Encounter: Payer: Self-pay | Admitting: Physician Assistant

## 2016-06-02 VITALS — BP 130/76 | HR 84 | Temp 98.6°F | Resp 16 | Ht 62.0 in | Wt 156.0 lb

## 2016-06-02 DIAGNOSIS — E7800 Pure hypercholesterolemia, unspecified: Secondary | ICD-10-CM

## 2016-06-02 DIAGNOSIS — R7309 Other abnormal glucose: Secondary | ICD-10-CM

## 2016-06-02 DIAGNOSIS — F411 Generalized anxiety disorder: Secondary | ICD-10-CM

## 2016-06-02 DIAGNOSIS — E039 Hypothyroidism, unspecified: Secondary | ICD-10-CM

## 2016-06-02 DIAGNOSIS — Z0000 Encounter for general adult medical examination without abnormal findings: Secondary | ICD-10-CM

## 2016-06-02 DIAGNOSIS — Z1211 Encounter for screening for malignant neoplasm of colon: Secondary | ICD-10-CM | POA: Diagnosis not present

## 2016-06-02 DIAGNOSIS — R011 Cardiac murmur, unspecified: Secondary | ICD-10-CM

## 2016-06-02 DIAGNOSIS — Z124 Encounter for screening for malignant neoplasm of cervix: Secondary | ICD-10-CM

## 2016-06-02 DIAGNOSIS — Z23 Encounter for immunization: Secondary | ICD-10-CM

## 2016-06-02 DIAGNOSIS — J301 Allergic rhinitis due to pollen: Secondary | ICD-10-CM

## 2016-06-02 MED ORDER — FLUTICASONE PROPIONATE 50 MCG/ACT NA SUSP: 2.0000 | Freq: Every day | NASAL | 6 refills | Status: DC

## 2016-06-02 MED ORDER — ALPRAZOLAM 0.5 MG PO TABS: 0.5000 mg | ORAL_TABLET | Freq: Every evening | ORAL | 3 refills | Status: DC | PRN

## 2016-06-02 NOTE — Patient Instructions (Signed)

## 2016-06-02 NOTE — Progress Notes (Signed)
Patient: Jordan Baker, Female    DOB: 1960/08/21, 55 y.o.   MRN: [id number] Visit Date: 06/02/2016  Today's Provider: Mar Daring, PA-C   Chief Complaint  Patient presents with  . Annual Exam   Subjective:    Annual physical exam Jordan Baker is a 55 y.o. female who presents today for health maintenance and complete physical. She feels well. She reports exercising none. She reports she is sleeping fairly well.  05/31/15 CPE 04/29/13 Pap-neg 04/07/16 Mammogram-BI-RADS 1 12/31/12 Colonoscopy-polyps -----------------------------------------------------------------   Review of Systems  Constitutional: Negative.   HENT: Positive for congestion and sinus pressure.   Eyes: Negative.   Respiratory: Positive for chest tightness (occasional; seems anxiety, stress related).   Cardiovascular: Negative.   Gastrointestinal: Positive for constipation.  Endocrine: Negative.   Genitourinary: Negative.   Musculoskeletal: Positive for arthralgias.  Skin: Negative.   Allergic/Immunologic: Negative.   Neurological: Negative.   Hematological: Negative.   Psychiatric/Behavioral: Negative.     Social History      She  reports that she has never smoked. She has never used smokeless tobacco. She reports that she drinks alcohol. She reports that she does not use drugs.       Social History   Social History  . Marital status: Married    Spouse name: N/A  . Number of children: 2  . Years of education: N/A   Social History Main Topics  . Smoking status: Never Smoker  . Smokeless tobacco: Never Used  . Alcohol use 0.0 oz/week     Comment: Occasional  . Drug use: No  . Sexual activity: Not Asked   Other Topics Concern  . None   Social History Narrative  . None    Past Medical History:  Diagnosis Date  . Anxiety   . GERD (gastroesophageal reflux disease)   . Hyperlipidemia      Patient Active Problem List   Diagnosis Date Noted  . Abnormal blood  sugar 06/02/2016  . Fibroid 05/31/2015  . Nerve pain 05/31/2015  . Hypercholesterolemia 05/31/2015  . Hot flash, menopausal 05/31/2015  . GERD (gastroesophageal reflux disease) 05/15/2015  . Arthritis 05/15/2015  . Hypothyroidism 05/15/2015  . Anxiety disorder 05/15/2015  . Sleep apnea 05/15/2015  . Insomnia 05/15/2015  . Neuroendocrine tumor 02/22/2013  . 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 Bladder infection, chronic 08/10/2012  . Female genital symptoms 08/10/2012    Past Surgical History:  Procedure Laterality Date  . BIOPSY RECTAL    . CESAREAN SECTION  1990/1993   x2  . EUS N/A 05/17/2015   Procedure: LOWER ENDOSCOPIC ULTRASOUND (EUS);  Surgeon: Holly Bodily, MD;  Location: [facility] ENDOSCOPY;  Service: Gastroenterology;  Laterality: N/A;  . OVARIAN CYST SURGERY  1993    Family History        Family Status  Relation Status  . Paternal Grandmother Deceased at age 63   died from old age  . Mother Alive  . Father Deceased at age 87   Lung cancer  . Sister Alive  . Maternal Grandmother Deceased at age 84   died from old age  . Maternal Grandfather Deceased at age 84's   died from [place] heart failure.  . Paternal Grandfather Deceased at age 25   died from old age  . Sister Alive  . Son Alive  . Son Alive        Her family history includes Arthritis in her maternal grandfather; Breast cancer in her  paternal grandmother; Cancer in her paternal grandmother; Hyperlipidemia in her mother; Hypertension in her mother and sister.     Allergies  Allergen Reactions  . Nitrofurantoin Monohyd Macro Rash     Current Outpatient Prescriptions:  .  ALPRAZolam (XANAX) 0.5 MG tablet, Take 1 tablet (0.5 mg total) by mouth at bedtime as needed for anxiety., Disp: 90 tablet, Rfl: 3 .  amitriptyline (ELAVIL) 25 MG tablet, Take 1 tablet (25 mg total) by mouth at bedtime., Disp: 90 tablet, Rfl: 3 .  Calcium Carbonate-Vitamin D (CALCIUM 500 + D) 500-125 MG-UNIT TABS,  Take by mouth., Disp: , Rfl:  .  cetirizine (ZYRTEC) 10 MG tablet, Take by mouth., Disp: , Rfl:  .  cyclobenzaprine (FLEXERIL) 10 MG tablet, Take 1 tablet (10 mg total) by mouth 3 (three) times daily as needed for muscle spasms., Disp: 30 tablet, Rfl: 3 .  doxycycline (VIBRA-TABS) 100 MG tablet, Take 100 mg by mouth., Disp: , Rfl:  .  fluticasone (FLONASE) 50 MCG/ACT nasal spray, Place 2 sprays into both nostrils daily., Disp: 16 g, Rfl: 6 .  levonorgestrel-ethinyl estradiol (AVIANE) 0.1-20 MG-MCG tablet, Take 1 tablet by mouth daily., Disp: 3 Package, Rfl: 3 .  levothyroxine (SYNTHROID, LEVOTHROID) 112 MCG tablet, 112 mcg daily before breakfast. , Disp: , Rfl: 3 .  Multiple Vitamin (MULTI-VITAMINS) TABS, Take by mouth., Disp: , Rfl:  .  NON FORMULARY, CPAP (Device) nightly, Disp: , Rfl:  .  omeprazole (PRILOSEC) 20 MG capsule, TAKE ONE CAPSULE BY MOUTH ONCE DAILY, Disp: 90 capsule, Rfl: 3 .  pravastatin (PRAVACHOL) 40 MG tablet, Take 1 tablet (40 mg total) by mouth daily., Disp: 90 tablet, Rfl: 3   Patient Care Team: Mar Daring, PA-C as PCP - General (Family Medicine)      Objective:   Vitals: BP 130/76 (BP Location: Right Arm, Patient Position: Sitting, Cuff Size: Large)   Pulse 84   Temp 98.6 F (37 C) (Oral)   Resp 16   Ht 5\' 2"  (1.575 m)   Wt 156 lb (70.8 kg)   LMP 05/14/2016 (Exact Date)   BMI 28.53 kg/m    Physical Exam  Constitutional: She is oriented to person, place, and time. She appears well-developed and well-nourished. No distress.  HENT:  Head: Normocephalic and atraumatic.  Right Ear: Hearing, tympanic membrane, external ear and ear canal normal.  Left Ear: Hearing, tympanic membrane, external ear and ear canal normal.  Nose: Nose normal.  Mouth/Throat: Uvula is midline, oropharynx is clear and moist and mucous membranes are normal. No oropharyngeal exudate.  Eyes: Conjunctivae and EOM are normal. Pupils are equal, round, and reactive to light. Right eye  exhibits no discharge. Left eye exhibits no discharge. No scleral icterus.  Neck: Normal range of motion. Neck supple. No JVD present. Carotid bruit is not present. No tracheal deviation present. No thyromegaly present.  Cardiovascular: Normal rate, regular rhythm and intact distal pulses.  Exam reveals no gallop and no friction rub.   Murmur heard.  Systolic murmur is present with a grade of 2/6  Heard best over aortic area over right second intercostal space  Pulmonary/Chest: Effort normal and breath sounds normal. No respiratory distress. She has no wheezes. She has no rales. She exhibits no tenderness. Right breast exhibits no inverted nipple, no mass, no nipple discharge, no skin change and no tenderness. Left breast exhibits no inverted nipple, no mass, no nipple discharge, no skin change and no tenderness. Breasts are symmetrical.  Abdominal: Soft. Bowel sounds  are normal. She exhibits no distension and no mass. There is no tenderness. There is no rebound and no guarding. Hernia confirmed negative in the right inguinal area and confirmed negative in the left inguinal area.  Genitourinary: Rectum normal, vagina normal and uterus normal. Rectal exam shows no external hemorrhoid, no internal hemorrhoid, no mass, no tenderness and guaiac negative stool. No breast swelling, tenderness, discharge or bleeding. Pelvic exam was performed with patient supine. There is no rash, tenderness, lesion or injury on the right labia. There is no rash, tenderness, lesion or injury on the left labia. Cervix exhibits no motion tenderness, no discharge and no friability. Right adnexum displays no mass, no tenderness and no fullness. Left adnexum displays no mass, no tenderness and no fullness. No erythema, tenderness or bleeding in the vagina. No signs of injury around the vagina. No vaginal discharge found.  Musculoskeletal: Normal range of motion. She exhibits no edema or tenderness.  Lymphadenopathy:    She has no  cervical adenopathy.       Right: No inguinal adenopathy present.       Left: No inguinal adenopathy present.  Neurological: She is alert and oriented to person, place, and time. She has normal reflexes. No cranial nerve deficit. Coordination normal.  Skin: Skin is warm and dry. No rash noted. She is not diaphoretic.  Psychiatric: She has a normal mood and affect. Her behavior is normal. Judgment and thought content normal.  Vitals reviewed.    Depression Screen PHQ 2/9 Scores 06/02/2016  PHQ - 2 Score 0      Assessment & Plan:     Routine Health Maintenance and Physical Exam  Exercise Activities and Dietary recommendations Goals    None       There is no immunization history on file for this patient.  Health Maintenance  Topic Date Due  . TETANUS/TDAP  02/08/1981  . PAP SMEAR  04/22/2015  . INFLUENZA VACCINE  04/09/2017 (Originally 02/12/2016)  . MAMMOGRAM  04/07/2018  . COLONOSCOPY  12/16/2022  . Hepatitis C Screening  Completed  . HIV Screening  Completed     Discussed health benefits of physical activity, and encouraged her to engage in regular exercise appropriate for her age and condition.    1. Annual physical exam Normal physical exam with exception of cardiac murmur that was heard on exam. No known previous murmur noted or documented. Will check labs as below and f/u pending lab results. If labs are stable and WNL she will not need to have these rechecked for one year at her next annual physical exam. She is to call the office in the meantime if she has any acute issue, questions or concerns.   2. Cervical cancer screening Pap collected today. Will send as below and f/u pending results. - Pap IG and HPV (high risk) DNA detection  3. Colon cancer screening Fecal occult testing in office was negative today. -iFOBT  4. Cardiac murmur New undiagnosed cardiac murmur heard best over right second intercostal space grade 1-2/6. Referral made for cardiology 2  Dr. Serafina Royals per patient request for further evaluation of this murmur.  5. Hypercholesterolemia Will check labs as below and f/u pending results. - CBC with Differential/Platelet - Lipid Panel With LDL/HDL Ratio  6. Hypothyroidism, unspecified type Will check labs as below and f/u pending results. - CBC with Differential/Platelet - Comprehensive metabolic panel - TSH  7. Abnormal blood sugar Will check labs as below and f/u pending results. - Comprehensive metabolic  panel - Hemoglobin A1c  8. Chronic seasonal allergic rhinitis due to pollen Stable. Diagnosis pulled for medication refill. Continue current medical treatment plan. - fluticasone (FLONASE) 50 MCG/ACT nasal spray; Place 2 sprays into both nostrils daily.  Dispense: 16 g; Refill: 6  9. Generalized anxiety disorder Stable. Diagnosis pulled for medication refill. Continue current medical treatment plan. - ALPRAZolam (XANAX) 0.5 MG tablet; Take 1 tablet (0.5 mg total) by mouth at bedtime as needed for anxiety.  Dispense: 90 tablet; Refill: 3  10. Need for TD vaccine Tetanus booster vaccine given to patient without complications. Patient sat for 15 minutes after administration and was tolerated well without adverse effects. - Td vaccine greater than or equal to 7yo preservative free IM  --------------------------------------------------------------------    Mar Daring, PA-C  [facility]

## 2016-06-03 ENCOUNTER — Other Ambulatory Visit: Payer: Self-pay | Admitting: Physician Assistant

## 2016-06-03 DIAGNOSIS — M792 Neuralgia and neuritis, unspecified: Secondary | ICD-10-CM

## 2016-06-03 DIAGNOSIS — E7800 Pure hypercholesterolemia, unspecified: Secondary | ICD-10-CM

## 2016-06-04 ENCOUNTER — Telehealth: Payer: Self-pay

## 2016-06-04 LAB — PAP IG AND HPV HIGH-RISK
HPV, high-risk: NEGATIVE
PAP Smear Comment: 0

## 2016-06-04 LAB — IFOBT (OCCULT BLOOD): IMMUNOLOGICAL FECAL OCCULT BLOOD TEST: NEGATIVE

## 2016-06-04 NOTE — Telephone Encounter (Signed)
Patient advised as directed below.  Thanks,  -Malani Lees 

## 2016-06-04 NOTE — Telephone Encounter (Signed)
-----   Message from Mar Daring, [place] sent at 06/04/2016  1:45 PM EST ----- Pap is normal, HPV negative.  Will repeat in 3-5 years.

## 2016-06-10 DIAGNOSIS — I10 Essential (primary) hypertension: Secondary | ICD-10-CM | POA: Insufficient documentation

## 2016-06-10 DIAGNOSIS — I208 Other forms of angina pectoris: Secondary | ICD-10-CM | POA: Insufficient documentation

## 2016-06-10 DIAGNOSIS — E782 Mixed hyperlipidemia: Secondary | ICD-10-CM | POA: Insufficient documentation

## 2016-06-11 ENCOUNTER — Telehealth: Payer: Self-pay

## 2016-06-11 DIAGNOSIS — E039 Hypothyroidism, unspecified: Secondary | ICD-10-CM

## 2016-06-11 LAB — COMPREHENSIVE METABOLIC PANEL
A/G RATIO: 1.7 (ref 1.2–2.2)
ALBUMIN: 4 g/dL (ref 3.5–5.5)
ALT: 11 IU/L (ref 0–32)
AST: 16 IU/L (ref 0–40)
Alkaline Phosphatase: 67 IU/L (ref 39–117)
BUN / CREAT RATIO: 13 (ref 9–23)
BUN: 11 mg/dL (ref 6–24)
Bilirubin Total: 0.2 mg/dL (ref 0.0–1.2)
CALCIUM: 9.1 mg/dL (ref 8.7–10.2)
CO2: 22 mmol/L (ref 18–29)
Chloride: 102 mmol/L (ref 96–106)
Creatinine, Ser: 0.83 mg/dL (ref 0.57–1.00)
GFR, EST AFRICAN AMERICAN: 92 mL/min/{1.73_m2} (ref >59)
GFR, EST NON AFRICAN AMERICAN: 80 mL/min/{1.73_m2} (ref >59)
GLOBULIN, TOTAL: 2.4 g/dL (ref 1.5–4.5)
Glucose: 85 mg/dL (ref 65–99)
POTASSIUM: 4.3 mmol/L (ref 3.5–5.2)
SODIUM: 142 mmol/L (ref 134–144)
Total Protein: 6.4 g/dL (ref 6.0–8.5)

## 2016-06-11 LAB — CBC WITH DIFFERENTIAL/PLATELET
BASOS: 1 %
Basophils Absolute: 0 10*3/uL (ref 0.0–0.2)
EOS (ABSOLUTE): 0.2 10*3/uL (ref 0.0–0.4)
EOS: 4 %
HEMATOCRIT: 38.2 % (ref 34.0–46.6)
Hemoglobin: 12.8 g/dL (ref 11.1–15.9)
IMMATURE GRANULOCYTES: 0 %
Immature Grans (Abs): 0 10*3/uL (ref 0.0–0.1)
LYMPHS ABS: 2.1 10*3/uL (ref 0.7–3.1)
Lymphs: 32 %
MCH: 29 pg (ref 26.6–33.0)
MCHC: 33.5 g/dL (ref 31.5–35.7)
MCV: 87 fL (ref 79–97)
MONOS ABS: 0.4 10*3/uL (ref 0.1–0.9)
Monocytes: 6 %
Neutrophils Absolute: 3.8 10*3/uL (ref 1.4–7.0)
Neutrophils: 57 %
Platelets: 351 10*3/uL (ref 150–379)
RBC: 4.41 x10E6/uL (ref 3.77–5.28)
RDW: 13.9 % (ref 12.3–15.4)
WBC: 6.6 10*3/uL (ref 3.4–10.8)

## 2016-06-11 LAB — LIPID PANEL WITH LDL/HDL RATIO
Cholesterol, Total: 198 mg/dL (ref 100–199)
HDL: 54 mg/dL (ref >39)
LDL Calculated: 129 mg/dL — ABNORMAL HIGH (ref 0–99)
LDL/HDL RATIO: 2.4 ratio (ref 0.0–3.2)
Triglycerides: 73 mg/dL (ref 0–149)
VLDL CHOLESTEROL CAL: 15 mg/dL (ref 5–40)

## 2016-06-11 LAB — TSH: TSH: 7.69 u[IU]/mL — ABNORMAL HIGH (ref 0.450–4.500)

## 2016-06-11 LAB — HEMOGLOBIN A1C
ESTIMATED AVERAGE GLUCOSE: 105 mg/dL
Hgb A1c MFr Bld: 5.3 % (ref 4.8–5.6)

## 2016-06-11 MED ORDER — LEVOTHYROXINE SODIUM 125 MCG PO TABS: 125.0000 ug | ORAL_TABLET | Freq: Every day | ORAL | 0 refills | Status: DC

## 2016-06-11 NOTE — Telephone Encounter (Signed)
Levothyroxine 166mcg sent in

## 2016-06-11 NOTE — Telephone Encounter (Signed)
-----   Message from Mar Daring, [place] sent at 06/11/2016  8:29 AM EST ----- Labs are stable and WNL with exception of TSH which has increased from previous. Make sure taking levothyroxine in morning 30 minutes prior to food or other meds. If taking appropriately let me know and I will increase dose to 111mcg from 183mcg. I do think we should recheck in 6-8 weeks whether dose is changed or not.

## 2016-06-11 NOTE — Telephone Encounter (Signed)
Patient was advised of lab report she states that she was taking Levothyroxine 117mcg 56min prior to foods or meds in the AM. Patient is in agreeance  with you about increasing Levothyroxine to 112mcg. Patient request that new prescription be sent to [facility] on [address]

## 2016-06-22 DIAGNOSIS — R339 Retention of urine, unspecified: Secondary | ICD-10-CM | POA: Insufficient documentation

## 2016-07-18 ENCOUNTER — Telehealth: Payer: Self-pay | Admitting: Physician Assistant

## 2016-07-18 DIAGNOSIS — E039 Hypothyroidism, unspecified: Secondary | ICD-10-CM

## 2016-07-18 NOTE — Telephone Encounter (Signed)
Pt is requesting a lab slip to have thyroid rechecked.  [id number]

## 2016-07-18 NOTE — Telephone Encounter (Signed)
Lab slip printed. She does not need to pick up slip she can just go straight to [facility] if she wishes.

## 2016-07-18 NOTE — Telephone Encounter (Signed)
Patient advised as directed below.  Thanks,  -Joseline 

## 2016-07-19 LAB — T4: T4, Total: 10.7 ug/dL (ref 4.5–12.0)

## 2016-07-19 LAB — TSH: TSH: 3.3 u[IU]/mL (ref 0.450–4.500)

## 2016-07-21 ENCOUNTER — Telehealth: Payer: Self-pay

## 2016-07-21 ENCOUNTER — Other Ambulatory Visit: Payer: Self-pay

## 2016-07-21 DIAGNOSIS — E039 Hypothyroidism, unspecified: Secondary | ICD-10-CM

## 2016-07-21 MED ORDER — LEVOTHYROXINE SODIUM 125 MCG PO TABS: 125.0000 ug | ORAL_TABLET | Freq: Every day | ORAL | 1 refills | Status: DC

## 2016-07-21 NOTE — Telephone Encounter (Signed)
Yes

## 2016-07-21 NOTE — Telephone Encounter (Signed)
Patient advised as directed below. Patient is asking if she is staying on the 125 MCG of Levothyroxine since her levels are WNL. Please advise.  Thanks,  -Jordan Baker

## 2016-07-21 NOTE — Telephone Encounter (Signed)
Patient reports she doesn't have any more refill on the medication.  Levothyroxine 125 MCG  Qty:90 R:0  Does she need additional refills.  Thanks,  -Joseline

## 2016-07-21 NOTE — Telephone Encounter (Signed)
-----   Message from Mar Daring, PA-C sent at 07/21/2016  9:22 AM EST ----- TSH and T4 are now WNL

## 2016-08-12 ENCOUNTER — Other Ambulatory Visit: Payer: Self-pay | Admitting: Physician Assistant

## 2016-08-12 DIAGNOSIS — Z3041 Encounter for surveillance of contraceptive pills: Secondary | ICD-10-CM

## 2016-08-20 ENCOUNTER — Encounter: Payer: Self-pay | Admitting: Physician Assistant

## 2016-08-20 ENCOUNTER — Ambulatory Visit: Payer: Self-pay | Admitting: Physician Assistant

## 2016-08-20 VITALS — BP 130/92 | HR 114 | Temp 98.8°F

## 2016-08-20 DIAGNOSIS — R52 Pain, unspecified: Secondary | ICD-10-CM

## 2016-08-20 DIAGNOSIS — H9203 Otalgia, bilateral: Secondary | ICD-10-CM

## 2016-08-20 MED ORDER — AMOXICILLIN 875 MG PO TABS: 875.0000 mg | ORAL_TABLET | Freq: Two times a day (BID) | ORAL | 0 refills | Status: DC

## 2016-08-20 MED ORDER — METHYLPREDNISOLONE 4 MG PO TBPK: ORAL_TABLET | ORAL | 0 refills | Status: DC

## 2016-08-20 MED ORDER — FLUCONAZOLE 150 MG PO TABS: ORAL_TABLET | ORAL | 0 refills | Status: DC

## 2016-08-20 NOTE — Progress Notes (Signed)
S: C/o runny nose and congestion for 7 days, no fever, chills, cp/sob, v/d; ears are hurting, nasal congestion passes from left to right, some blood in mucus, also somewhat achy, no known fever but just doesn't feel well, had a pt who tested + flu B  Using otc meds:   O: PE: vitals wnl, nad, perrl eomi, normocephalic, tms dull, nasal mucosa red and swollen, throat injected, neck supple no lymph, lungs c t a, cv rrr, neuro intact, flu swab neg  A:  Acute sinusitis   P: drink fluids, continue regular meds , use otc meds of choice, return if not improving in 5 days, return earlier if worsening  Amoxil, medrol dose pack, diflucan if needed

## 2016-12-31 ENCOUNTER — Ambulatory Visit
Admission: RE | Admit: 2016-12-31 | Discharge: 2016-12-31 | Disposition: A | Discharge status: Home / Self Care | Payer: Managed Care, Other (non HMO) | Source: Ambulatory Visit | Attending: Pain Medicine | Admitting: Pain Medicine

## 2016-12-31 ENCOUNTER — Other Ambulatory Visit: Payer: Self-pay | Admitting: Pain Medicine

## 2016-12-31 DIAGNOSIS — M25561 Pain in right knee: Principal | ICD-10-CM

## 2016-12-31 DIAGNOSIS — G8929 Other chronic pain: Secondary | ICD-10-CM | POA: Insufficient documentation

## 2016-12-31 DIAGNOSIS — M170 Bilateral primary osteoarthritis of knee: Secondary | ICD-10-CM | POA: Diagnosis not present

## 2016-12-31 DIAGNOSIS — M25562 Pain in left knee: Principal | ICD-10-CM

## 2017-01-05 ENCOUNTER — Ambulatory Visit (HOSPITAL_BASED_OUTPATIENT_CLINIC_OR_DEPARTMENT_OTHER): Payer: Managed Care, Other (non HMO) | Admitting: Pain Medicine

## 2017-01-05 ENCOUNTER — Ambulatory Visit
Admission: RE | Admit: 2017-01-05 | Discharge: 2017-01-05 | Disposition: A | Discharge status: Home / Self Care | Payer: Managed Care, Other (non HMO) | Source: Ambulatory Visit | Attending: Pain Medicine | Admitting: Pain Medicine

## 2017-01-05 ENCOUNTER — Encounter: Payer: Self-pay | Admitting: Pain Medicine

## 2017-01-05 VITALS — BP 154/75 | HR 100 | Temp 98.6°F | Resp 26 | Ht 62.0 in | Wt 152.0 lb

## 2017-01-05 DIAGNOSIS — M25561 Pain in right knee: Secondary | ICD-10-CM

## 2017-01-05 DIAGNOSIS — Z79899 Other long term (current) drug therapy: Secondary | ICD-10-CM | POA: Diagnosis not present

## 2017-01-05 DIAGNOSIS — M25562 Pain in left knee: Secondary | ICD-10-CM

## 2017-01-05 DIAGNOSIS — E039 Hypothyroidism, unspecified: Secondary | ICD-10-CM | POA: Diagnosis not present

## 2017-01-05 DIAGNOSIS — F419 Anxiety disorder, unspecified: Secondary | ICD-10-CM | POA: Insufficient documentation

## 2017-01-05 DIAGNOSIS — E782 Mixed hyperlipidemia: Secondary | ICD-10-CM | POA: Diagnosis not present

## 2017-01-05 DIAGNOSIS — I10 Essential (primary) hypertension: Secondary | ICD-10-CM | POA: Diagnosis not present

## 2017-01-05 DIAGNOSIS — K219 Gastro-esophageal reflux disease without esophagitis: Secondary | ICD-10-CM | POA: Insufficient documentation

## 2017-01-05 DIAGNOSIS — G8929 Other chronic pain: Secondary | ICD-10-CM | POA: Insufficient documentation

## 2017-01-05 DIAGNOSIS — M170 Bilateral primary osteoarthritis of knee: Secondary | ICD-10-CM | POA: Diagnosis not present

## 2017-01-05 MED ORDER — ROPIVACAINE HCL 2 MG/ML IJ SOLN
INTRAMUSCULAR | Status: AC
  Filled 2017-01-05: qty 10

## 2017-01-05 MED ORDER — ROPIVACAINE HCL 2 MG/ML IJ SOLN
5.0000 mL | Freq: Once | INTRAMUSCULAR | Status: AC
  Administered 2017-01-05: 10 mL via INTRA_ARTICULAR

## 2017-01-05 MED ORDER — METHYLPREDNISOLONE ACETATE 80 MG/ML IJ SUSP
80.0000 mg | Freq: Once | INTRAMUSCULAR | Status: AC
  Administered 2017-01-05: 80 mg via INTRA_ARTICULAR

## 2017-01-05 MED ORDER — LIDOCAINE HCL (PF) 1 % IJ SOLN
INTRAMUSCULAR | Status: AC
  Filled 2017-01-05: qty 5

## 2017-01-05 MED ORDER — LIDOCAINE HCL (PF) 1 % IJ SOLN
5.0000 mL | Freq: Once | INTRAMUSCULAR | Status: AC
  Administered 2017-01-05: 5 mL

## 2017-01-05 MED ORDER — METHYLPREDNISOLONE ACETATE 80 MG/ML IJ SUSP
INTRAMUSCULAR | Status: AC
  Filled 2017-01-05: qty 2

## 2017-01-05 NOTE — Progress Notes (Signed)
Patient's Name: Jordan Baker  MRN: [id number]  Referring Provider: Mar Daring, Mamie Nick*  DOB: 1961/03/05  PCP: Mar Daring, PA-C  DOS: 01/05/2017  Note by: Kathlen Brunswick. Dossie Arbour, MD  Service setting: Ambulatory outpatient  Specialty: Interventional Pain Management  Location: [facility] (AMB) Pain Management Facility    Patient type: New patient ("FAST-TRACK" Evaluation)   Warning: This referral option does not include the extensive pharmacological evaluation required for [place] to take over the patient's medication management. The "Fast-Track" system is designed to bypass the new patient referral waiting list, as well as the normal patient evaluation process, in order to provide a patient in distress with a timely pain management intervention. Because the system was not designed to unfairly get a patient into our pain practice ahead of those already waiting, certain restrictions apply. By requesting a "Fast-Track" consult, the referring physician has opted to continue managing the patient's medications in order to get interventional urgent care.  Primary Reason for Visit: Interventional Pain Management Treatment. CC: Knee Pain (bilateral, right is worse currently)  Procedure:  Anesthesia, Analgesia, Anxiolysis:  Type: Diagnostic Intra-Articular Local anesthetic and steroid Knee Injection Region: Lateral infrapatellar Knee Region Level: Knee Joint Laterality: Bilateral  Type: Local Anesthesia Local Anesthetic: Lidocaine 1% Route: Infiltration ([place]/IM) IV Access: Declined Sedation: Declined  Indication(s): Analgesia          Indications: 1. Chronic knee pain (Bilateral) (R>L)   2. Osteoarthritis of knee (Bilateral) (L>R)    Pain Score: Pre-procedure: 3 /10 Post-procedure:  (0 in the left and 2 on the right)/10  HPI  Jordan Baker is a 56 y.o. year old, female patient, who comes today for a  "Fast-Track" new patient evaluation, as requested by Mar Daring, P*. The patient has  been made aware that this type of referral option is reserved for the Interventional Pain Management portion of our practice and completely excludes the option of medication management. Her primarily concern today is the Knee Pain (bilateral, right is worse currently)  Pain Assessment: Location: Left, Right Knee Radiating: na Onset: More than a month ago Duration: Chronic pain Quality: Aching, Burning, Sharp Severity: 3 /10 (self-reported pain score)  Note:             Reported level is compatible with observation.       Effect on ADL: sitting for long periods make it worse Timing: Constant (activity or sitting (same position) can make it worse) Modifying factors: hot bath,   Onset and Duration: Gradual Cause of pain: Unknown Severity: Getting worse and NAS-11 at its worse: 5/10 Timing: Night, During activity or exercise and After activity or exercise Aggravating Factors: Walking, Walking uphill, Walking downhill and Working Alleviating Factors: Sitting and Warm showers or baths Associated Problems: Pain that wakes patient up and Pain that does not allow patient to sleep Quality of Pain: Aching, Nagging, Sharp and Uncomfortable Previous Examinations or Tests: X-rays and Orthoperdic evaluation Previous Treatments: Trigger point injections  Meds   Current Outpatient Prescriptions (Endocrine & Metabolic):  Marland Kitchen  AVIANE 0.1-20 MG-MCG tablet, TAKE ONE TABLET BY MOUTH ONCE DAILY .  levothyroxine (SYNTHROID, LEVOTHROID) 125 MCG tablet, Take 1 tablet (125 mcg total) by mouth daily.  Current Outpatient Prescriptions (Cardiovascular):  .  pravastatin (PRAVACHOL) 40 MG tablet, TAKE ONE TABLET BY MOUTH ONCE DAILY  Current Outpatient Prescriptions (Respiratory):  .  cetirizine (ZYRTEC) 10 MG tablet, Take by mouth. .  fluticasone (FLONASE) 50 MCG/ACT nasal spray, Place 2 sprays into both nostrils daily.  Current Outpatient Prescriptions (Analgesics):  .  celecoxib (CELEBREX) 200 MG capsule,  Take 200 mg by mouth at bedtime.   Current Outpatient Prescriptions (Other):  Marland Kitchen  ALPRAZolam (XANAX) 0.5 MG tablet, Take 1 tablet (0.5 mg total) by mouth at bedtime as needed for anxiety. Marland Kitchen  amitriptyline (ELAVIL) 25 MG tablet, TAKE ONE TABLET BY MOUTH AT BEDTIME .  cyclobenzaprine (FLEXERIL) 10 MG tablet, Take 1 tablet (10 mg total) by mouth 3 (three) times daily as needed for muscle spasms. Marland Kitchen  doxycycline (VIBRA-TABS) 100 MG tablet, Take 100 mg by mouth as needed.  .  NON FORMULARY, CPAP (Device) nightly .  omeprazole (PRILOSEC) 20 MG capsule, TAKE ONE CAPSULE BY MOUTH ONCE DAILY  Imaging Review  Knee Imaging: Knee-R DG 1-2 views:  Results for orders placed during the hospital encounter of 12/31/16  DG Knee 1-2 Views Right   Narrative CLINICAL DATA:  Chronic bilateral knee pain  EXAM: LEFT KNEE - 1-2 VIEW; RIGHT KNEE - 1-2 VIEW  COMPARISON:  None in PACs  FINDINGS: Right knee: The bones are subjectively adequately mineralized. There is moderate narrowing of the medial joint compartment. There is beaking of the tibial spines. A small spur arises from the periphery of the articular margin of the lateral tibial plateau. Spurs arise from the articular margins of the patella. There is a small suprapatellar effusion.  Left knee: The bones are subjectively adequately mineralized. There is moderate severe joint space loss of the medial compartment. There is beaking of the tibial spines. Small spurs arise from the periphery of the articular margins of the lateral femoral condyle and lateral tibial plateau. Spurs arise from the articular margins of the patella.  IMPRESSION: Degenerative change centered on the medial compartments of both knees greatest on the left. Milder changes elsewhere in both knees.   Electronically Signed   By: David  Martinique M.D.   On: 12/31/2016 16:07    Knee-L DG 1-2 views:  Results for orders placed during the hospital encounter of 12/31/16  DG Knee  1-2 Views Left   Narrative CLINICAL DATA:  Chronic bilateral knee pain  EXAM: LEFT KNEE - 1-2 VIEW; RIGHT KNEE - 1-2 VIEW  COMPARISON:  None in PACs  FINDINGS: Right knee: The bones are subjectively adequately mineralized. There is moderate narrowing of the medial joint compartment. There is beaking of the tibial spines. A small spur arises from the periphery of the articular margin of the lateral tibial plateau. Spurs arise from the articular margins of the patella. There is a small suprapatellar effusion.  Left knee: The bones are subjectively adequately mineralized. There is moderate severe joint space loss of the medial compartment. There is beaking of the tibial spines. Small spurs arise from the periphery of the articular margins of the lateral femoral condyle and lateral tibial plateau. Spurs arise from the articular margins of the patella.  IMPRESSION: Degenerative change centered on the medial compartments of both knees greatest on the left. Milder changes elsewhere in both knees.   Electronically Signed   By: David  Martinique M.D.   On: 12/31/2016 16:07    Note: Available results from prior imaging studies were reviewed.        ROS  Cardiovascular History: No reported cardiovascular signs or symptoms such as High blood pressure, coronary artery disease, abnormal heart rate or rhythm, heart attack, blood thinner therapy or heart weakness and/or failure Pulmonary or Respiratory History: Temporary stoppage of breathing during sleep Neurological History: No reported neurological signs or symptoms  such as seizures, abnormal skin sensations, urinary and/or fecal incontinence, being born with an abnormal open spine and/or a tethered spinal cord Review of Past Neurological Studies: No results found for this or any previous visit. Psychological-Psychiatric History: Anxiousness Gastrointestinal History: No reported gastrointestinal signs or symptoms such as vomiting or  evacuating blood, reflux, heartburn, alternating episodes of diarrhea and constipation, inflamed or scarred liver, or pancreas or irrregular and/or infrequent bowel movements Genitourinary History: Recurrent Urinary Tract infections Hematological History: No reported hematological signs or symptoms such as prolonged bleeding, low or poor functioning platelets, bruising or bleeding easily, hereditary bleeding problems, low energy levels due to low hemoglobin or being anemic Endocrine History: Slow thyroid Rheumatologic History: Joint aches and or swelling due to excess weight (Osteoarthritis) Musculoskeletal History: Negative for myasthenia gravis, muscular dystrophy, multiple sclerosis or malignant hyperthermia Work History: Working part time  Allergies  Ms. Poncedeleon is allergic to nitrofurantoin monohyd macro.  Laboratory Chemistry  Inflammation Markers No results found for: CRP, ESRSEDRATE (CRP: Acute Phase) (ESR: Chronic Phase) Renal Function Markers Lab Results  Component Value Date   BUN 11 06/10/2016   CREATININE 0.83 06/10/2016   GFRAA 92 06/10/2016   GFRNONAA 80 06/10/2016   Hepatic Function Markers Lab Results  Component Value Date   AST 16 06/10/2016   ALT 11 06/10/2016   ALBUMIN 4.0 06/10/2016   ALKPHOS 67 06/10/2016   Electrolytes Lab Results  Component Value Date   NA 142 06/10/2016   K 4.3 06/10/2016   CL 102 06/10/2016   CALCIUM 9.1 06/10/2016   Neuropathy Markers No results found for: [id number] Bone Pathology Markers Lab Results  Component Value Date   ALKPHOS 67 06/10/2016   CALCIUM 9.1 06/10/2016   Coagulation Parameters Lab Results  Component Value Date   PLT 351 06/10/2016   Cardiovascular Markers Lab Results  Component Value Date   HGB 12.8 06/10/2016   HCT 38.2 06/10/2016   Note: Lab results reviewed.  [place]  Drug: Ms. Laprise  reports that she does not use drugs. Alcohol:  reports that she drinks alcohol. Tobacco:  reports that she  has never smoked. She has never used smokeless tobacco. Medical:  has a past medical history of Anxiety; GERD (gastroesophageal reflux disease); and Hyperlipidemia. Family: family history includes Arthritis in her maternal grandfather; Breast cancer in her paternal grandmother; Cancer in her paternal grandmother; Hyperlipidemia in her mother; Hypertension in her mother and sister.  Past Surgical History:  Procedure Laterality Date  . BIOPSY RECTAL    . CESAREAN SECTION  1990/1993   x2  . EUS N/A 05/17/2015   Procedure: LOWER ENDOSCOPIC ULTRASOUND (EUS);  Surgeon: Holly Bodily, MD;  Location: [facility] ENDOSCOPY;  Service: Gastroenterology;  Laterality: N/A;  . OVARIAN CYST SURGERY  1993   Active Ambulatory Problems    Diagnosis Date Noted  . Bladder infection, chronic 08/10/2012  . 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 Female genital symptoms 08/10/2012  . GERD (gastroesophageal reflux disease) 05/15/2015  . Arthritis 05/15/2015  . Hypothyroidism 05/15/2015  . Anxiety disorder 05/15/2015  . Sleep apnea 05/15/2015  . Insomnia 05/15/2015  . Neuroendocrine tumor 02/22/2013  . Fibroid 05/31/2015  . Nerve pain 05/31/2015  . Hypercholesterolemia 05/31/2015  . Hot flash, menopausal 05/31/2015  . Abnormal blood sugar 06/02/2016  . Benign essential HTN 06/10/2016  . Hyperlipidemia, mixed 06/10/2016  . Incomplete emptying of bladder 06/22/2016  . Stable angina pectoris ([place]) 06/10/2016  . Increased frequency of urination 08/10/2012  . Chronic knee pain (Bilateral) (  R>L) 12/31/2016  . Osteoarthritis of knee (Bilateral) (L>R) 01/05/2017   Resolved Ambulatory Problems    Diagnosis Date Noted  . No Resolved Ambulatory Problems   Past Medical History:  Diagnosis Date  . Anxiety   . GERD (gastroesophageal reflux disease)   . Hyperlipidemia    Constitutional Exam  General appearance: Well nourished, well developed, and well hydrated. In no apparent acute distress Vitals:    01/05/17 1356 01/05/17 1400 01/05/17 1451  BP: (!) 159/74 (!) 148/75 (!) 154/75  Pulse: 100    Resp: 16 (!) 24 (!) 26  Temp: 98.6 F (37 C)    TempSrc: Oral    SpO2: 100% 97% 96%  Weight: 152 lb (68.9 kg)    Height: _0  (1.575 m)     BMI Assessment: Estimated body mass index is 27.8 kg/m as calculated from the following:   Height as of this encounter: _1  (1.575 m).   Weight as of this encounter: 152 lb (68.9 kg).  BMI interpretation table: BMI level Category Range association with higher incidence of chronic pain  <18 kg/m2 Underweight   18.5-24.9 kg/m2 Ideal body weight   25-29.9 kg/m2 Overweight Increased incidence by 20%  30-34.9 kg/m2 Obese (Class I) Increased incidence by 68%  35-39.9 kg/m2 Severe obesity (Class II) Increased incidence by 136%  >40 kg/m2 Extreme obesity (Class III) Increased incidence by 254%   BMI Readings from Last 4 Encounters:  01/05/17 27.80 kg/m  06/02/16 28.53 kg/m  05/31/15 27.91 kg/m  05/17/15 27.98 kg/m   Wt Readings from Last 4 Encounters:  01/05/17 152 lb (68.9 kg)  06/02/16 156 lb (70.8 kg)  05/31/15 152 lb 9.6 oz (69.2 kg)  05/17/15 153 lb (69.4 kg)  Psych/Mental status: Alert, oriented x 3 (person, place, & time)       Eyes: PERLA Respiratory: No evidence of acute respiratory distress  Lumbar Spine Exam  Inspection: No masses, redness, or swelling Alignment: Symmetrical Functional ROM: Unrestricted ROM      Stability: No instability detected Muscle strength & Tone: Functionally intact Sensory: Unimpaired Palpation: No palpable anomalies       Provocative Tests: Lumbar Hyperextension and rotation test: evaluation deferred today       Patrick's Maneuver: evaluation deferred today                    Gait & Posture Assessment  Ambulation: Unassisted Gait: Antalgic Posture: WNL   Lower Extremity Exam    Side: Right lower extremity  Side: Left lower extremity  Inspection: No masses, redness, swelling, or asymmetry. No  contractures  Inspection: No masses, redness, swelling, or asymmetry. No contractures  Functional ROM: Unrestricted ROM          Functional ROM: Unrestricted ROM          Muscle strength & Tone: Functionally intact  Muscle strength & Tone: Functionally intact  Sensory: Movement-associated discomfort  Sensory: Movement-associated discomfort  Palpation: No palpable anomalies  Palpation: No palpable anomalies   Pre-op Assessment:  Ms. Rathel is a 56 y.o. (year old), female patient, seen today for interventional treatment. She  has a past surgical history that includes Ovarian cyst surgery (1993); Cesarean section (1990/1993); Biopsy rectal; and EUS (N/A, 05/17/2015). Her primarily concern today is the Knee Pain (bilateral, right is worse currently)  Initial Vital Signs: Blood pressure (!) 159/74, pulse 100, temperature 98.6 F (37 C), temperature source Oral, resp. rate 16, height _2  (1.575 m), weight 152 lb (68.9 kg), SpO2  100 %. BMI: 27.80 kg/m  Risk Assessment: Allergies: Reviewed. She is allergic to nitrofurantoin monohyd macro.  Allergy Precautions: None required Coagulopathies: Reviewed. None identified.  Blood-thinner therapy: None at this time Active Infection(s): Reviewed. None identified. Ms. Mula is afebrile  Site Confirmation: Ms. Pant was asked to confirm the procedure and laterality before marking the site Procedure checklist: Completed Consent: Before the procedure and under the influence of no sedative(s), amnesic(s), or anxiolytics, the patient was informed of the treatment options, risks and possible complications. To fulfill our ethical and legal obligations, as recommended by the American Medical Association's Code of Ethics, I have informed the patient of my clinical impression; the nature and purpose of the treatment or procedure; the risks, benefits, and possible complications of the intervention; the alternatives, including doing nothing; the risk(s) and benefit(s)  of the alternative treatment(s) or procedure(s); and the risk(s) and benefit(s) of doing nothing. The patient was provided information about the general risks and possible complications associated with the procedure. These may include, but are not limited to: failure to achieve desired goals, infection, bleeding, organ or nerve damage, allergic reactions, paralysis, and death. In addition, the patient was informed of those risks and complications associated to Spine-related procedures, such as failure to decrease pain; infection (i.e.: Meningitis, epidural or intraspinal abscess); bleeding (i.e.: epidural hematoma, subarachnoid hemorrhage, or any other type of intraspinal or peri-dural bleeding); organ or nerve damage (i.e.: Any type of peripheral nerve, nerve root, or spinal cord injury) with subsequent damage to sensory, motor, and/or autonomic systems, resulting in permanent pain, numbness, and/or weakness of one or several areas of the body; allergic reactions; (i.e.: anaphylactic reaction); and/or death. Furthermore, the patient was informed of those risks and complications associated with the medications. These include, but are not limited to: allergic reactions (i.e.: anaphylactic or anaphylactoid reaction(s)); adrenal axis suppression; blood sugar elevation that in diabetics may result in ketoacidosis or comma; water retention that in patients with history of congestive heart failure may result in shortness of breath, pulmonary edema, and decompensation with resultant heart failure; weight gain; swelling or edema; medication-induced neural toxicity; particulate matter embolism and blood vessel occlusion with resultant organ, and/or nervous system infarction; and/or aseptic necrosis of one or more joints. Finally, the patient was informed that Medicine is not an exact science; therefore, there is also the possibility of unforeseen or unpredictable risks and/or possible complications that may result in a  catastrophic outcome. The patient indicated having understood very clearly. We have given the patient no guarantees and we have made no promises. Enough time was given to the patient to ask questions, all of which were answered to the patient's satisfaction. Ms. Obando has indicated that she wanted to continue with the procedure. Attestation: I, the ordering provider, attest that I have discussed with the patient the benefits, risks, side-effects, alternatives, likelihood of achieving goals, and potential problems during recovery for the procedure that I have provided informed consent. Date: 01/05/2017; Time: 2:26 PM  Pre-Procedure Preparation:  Monitoring: As per clinic protocol. Respiration, ETCO2, SpO2, BP, heart rate and rhythm monitor placed and checked for adequate function Safety Precautions: Patient was assessed for positional comfort and pressure points before starting the procedure. Time-out: I initiated and conducted the "Time-out" before starting the procedure, as per protocol. The patient was asked to participate by confirming the accuracy of the "Time Out" information. Verification of the correct person, site, and procedure were performed and confirmed by me, the nursing staff, and the patient. "Time-out" conducted  as per Joint Commission's Universal Protocol (UP.01.01.01). "Time-out" Date & Time: 01/05/2017; 1441 hrs.  Description of Procedure Process:   Position: Supine Target Area: Knee Joint Approach: Just above the Medial tibial plateau, lateral to the infrapatellar tendon. Area Prepped: Entire knee area, from the mid-thigh to the mid-shin. Prepping solution: ChloraPrep (2% chlorhexidine gluconate and 70% isopropyl alcohol) Safety Precautions: Aspiration looking for blood return was conducted prior to all injections. At no point did we inject any substances, as a needle was being advanced. No attempts were made at seeking any paresthesias. Safe injection practices and needle disposal  techniques used. Medications properly checked for expiration dates. SDV (single dose vial) medications used. Description of the Procedure: Protocol guidelines were followed. The patient was placed in position over the fluoroscopy table. The target area was identified and the area prepped in the usual manner. Skin desensitized using vapocoolant spray. Skin & deeper tissues infiltrated with local anesthetic. Appropriate amount of time allowed to pass for local anesthetics to take effect. The procedure needles were then advanced to the target area. Proper needle placement secured. Negative aspiration confirmed. Solution injected in intermittent fashion, asking for systemic symptoms every 0.5cc of injectate. The needles were then removed and the area cleansed, making sure to leave some of the prepping solution back to take advantage of its long term bactericidal properties. Vitals:   01/05/17 1356 01/05/17 1400 01/05/17 1451  BP: (!) 159/74 (!) 148/75 (!) 154/75  Pulse: 100    Resp: 16 (!) 24 (!) 26  Temp: 98.6 F (37 C)    TempSrc: Oral    SpO2: 100% 97% 96%  Weight: 152 lb (68.9 kg)    Height: _0  (1.575 m)      Start Time: 1443 hrs. End Time: 1452 hrs. Materials:  Needle(s) Type: Regular needle Gauge: 25G Length: 1.5-in Medication(s): We administered methylPREDNISolone acetate, lidocaine (PF), and ropivacaine (PF) 2 mg/mL (0.2%). Please see chart orders for dosing details.  Imaging Guidance (Non-Spinal):  Type of Imaging Technique: Fluoroscopy Guidance (Non-Spinal) Indication(s): Assistance in needle guidance and placement for procedures requiring needle placement in or near specific anatomical locations not easily accessible without such assistance. Exposure Time: Please see nurses notes. Contrast: None used. Fluoroscopic Guidance: I was personally present during the use of fluoroscopy. "Tunnel Vision Technique" used to obtain the best possible view of the target area. Parallax error  corrected before commencing the procedure. "Direction-depth-direction" technique used to introduce the needle under continuous pulsed fluoroscopy. Once target was reached, antero-posterior, oblique, and lateral fluoroscopic projection used confirm needle placement in all planes. Images permanently stored in EMR. Interpretation: No contrast injected. I personally interpreted the imaging intraoperatively. Adequate needle placement confirmed in multiple planes. Permanent images saved into the patient's record.  Antibiotic Prophylaxis:  Indication(s): None identified Antibiotic given: None  Post-operative Assessment:  EBL: None Complications: No immediate post-treatment complications observed by team, or reported by patient. Note: The patient tolerated the entire procedure well. A repeat set of vitals were taken after the procedure and the patient was kept under observation following institutional policy, for this type of procedure. Post-procedural neurological assessment was performed, showing return to baseline, prior to discharge. The patient was provided with post-procedure discharge instructions, including a section on how to identify potential problems. Should any problems arise concerning this procedure, the patient was given instructions to immediately contact us, at any time, without hesitation. In any case, we plan to contact the patient by telephone for a follow-up status report regarding this interventional  procedure. Comments:  No additional relevant information.  Plan of Care  Disposition: Discharge home  Discharge Date & Time: 01/05/2017; 1457 hrs.  Physician-requested Follow-up:  Return for post-procedure eval (in 2 wks).  Future Appointments Date Time Provider [place]  02/12/2017 2:30 PM Milinda Pointer, MD [facility] None   Medications ordered for procedure: Meds ordered this encounter  Medications  . methylPREDNISolone acetate (DEPO-MEDROL) injection 80 mg  .  lidocaine (PF) (XYLOCAINE) 1 % injection 5 mL  . ropivacaine (PF) 2 mg/mL (0.2%) (NAROPIN) injection 5 mL   Medications administered: We administered methylPREDNISolone acetate, lidocaine (PF), and ropivacaine (PF) 2 mg/mL (0.2%).  See the medical record for exact dosing, route, and time of administration.  Lab-work, Procedure(s), & Referral(s) Ordered: Orders Placed This Encounter  Procedures  . KNEE INJECTION  . DG C-Arm 1-60 Min-No Report  . MR KNEE LEFT WO CONTRAST  . MR KNEE RIGHT WO CONTRAST   Imaging Ordered: New Prescriptions   No medications on file   Primary Care Physician: Mar Daring, PA-C Location: [facility] Outpatient [facility] Management Facility Note by: Kathlen Brunswick. Dossie Arbour, M.D, DABA, DABAPM, DABPM, DABIPP, FIPP Date: 01/05/2017; Time: 5:34 PM  Disclaimer:  Medicine is not an exact science. The only guarantee in medicine is that nothing is guaranteed. It is important to note that the decision to proceed with this intervention was based on the information collected from the patient. The Data and conclusions were drawn from the patient's questionnaire, the interview, and the physical examination. Because the information was provided in large part by the patient, it cannot be guaranteed that it has not been purposely or unconsciously manipulated. Every effort has been made to obtain as much relevant data as possible for this evaluation. It is important to note that the conclusions that lead to this procedure are derived in large part from the available data. Always take into account that the treatment will also be dependent on availability of resources and existing treatment guidelines, considered by other Pain Management Practitioners as being common knowledge and practice, at the time of the intervention. For Medico-Legal purposes, it is also important to point out that variation in procedural techniques and pharmacological choices are the acceptable norm. The indications,  contraindications, technique, and results of the above procedure should only be interpreted and judged by a Board-Certified Interventional Pain Specialist with extensive familiarity and expertise in the same exact procedure and technique.  Instructions provided at this appointment: Patient Instructions  ____________________________________________________________________________________________  Post-Procedure instructions Instructions:  Apply ice: Fill a plastic sandwich bag with crushed ice. Cover it with a small towel and apply to injection site. Apply for 15 minutes then remove x 15 minutes. Repeat sequence on day of procedure, until you go to bed. The purpose is to minimize swelling and discomfort after procedure.  Apply heat: Apply heat to procedure site starting the day following the procedure. The purpose is to treat any soreness and discomfort from the procedure.  Food intake: Start with clear liquids (like water) and advance to regular food, as tolerated.   Physical activities: Keep activities to a minimum for the first 8 hours after the procedure.   Driving: If you have received any sedation, you are not allowed to drive for 24 hours after your procedure.  Blood thinner: Restart your blood thinner 6 hours after your procedure. (Only for those taking blood thinners)  Insulin: As soon as you can eat, you may resume your normal dosing schedule. (Only for those taking insulin)  Infection prevention: Keep procedure site clean and dry.  Post-procedure Pain Diary: Extremely important that this be done correctly and accurately. Recorded information will be used to determine the next step in treatment.  Pain evaluated is that of treated area only. Do not include pain from an untreated area.  Complete every hour, on the hour, for the initial 8 hours. Set an alarm to help you do this part accurately.  Do not go to sleep and have it completed later. It will not be accurate.  Follow-up  appointment: Keep your follow-up appointment after the procedure. Usually 2 weeks for most procedures. (6 weeks in the case of radiofrequency.) Bring you pain diary.  Expect:  From numbing medicine (AKA: Local Anesthetics): Numbness or decrease in pain.  Onset: Full effect within 15 minutes of injected.  Duration: It will depend on the type of local anesthetic used. On the average, 1 to 8 hours.   From steroids: Decrease in swelling or inflammation. Once inflammation is improved, relief of the pain will follow.  Onset of benefits: Depends on the amount of swelling present. The more swelling, the longer it will take for the benefits to be seen. In some cases, up to 10 days.  Duration: Steroids will stay in the system x 2 weeks. Duration of benefits will depend on multiple posibilities including persistent irritating factors.  From procedure: Some discomfort is to be expected once the numbing medicine wears off. This should be minimal if ice and heat are applied as instructed. Call if:  You experience numbness and weakness that gets worse with time, as opposed to wearing off.  New onset bowel or bladder incontinence. (Spinal procedures only)  Emergency Numbers:  Durning business hours (Monday - Thursday, 8:00 AM - 4:00 PM) (Friday, 9:00 AM - 12:00 Noon): [phone]  After hours: [phone] ____________________________________________________________________________________________  Pain Management Discharge Instructions  General Discharge Instructions :  If you need to reach your doctor call: Monday-Friday 8:00 am - 4:00 pm at [phone] or toll free [phone].  After clinic hours [phone] to have operator reach doctor.  Bring all of your medication bottles to all your appointments in the pain clinic.  To cancel or reschedule your appointment with Pain Management please remember to call 24 hours in advance to avoid a fee.  Refer to the educational materials  which you have been given on: General Risks, I had my Procedure. Discharge Instructions, Post Sedation.  Post Procedure Instructions:  The drugs you were given will stay in your system until tomorrow, so for the next 24 hours you should not drive, make any legal decisions or drink any alcoholic beverages.  You may eat anything you prefer, but it is better to start with liquids then soups and crackers, and gradually work up to solid foods.  Please notify your doctor immediately if you have any unusual bleeding, trouble breathing or pain that is not related to your normal pain.  Depending on the type of procedure that was done, some parts of your body may feel week and/or numb.  This usually clears up by tonight or the next day.  Walk with the use of an assistive device or accompanied by an adult for the 24 hours.  You may use ice on the affected area for the first 24 hours.  Put ice in a Ziploc bag and cover with a towel and place against area 15 minutes on 15 minutes off.  You may switch to heat after 24 hours.

## 2017-01-05 NOTE — Progress Notes (Signed)
Safety precautions to be maintained throughout the outpatient stay will include: orient to surroundings, keep bed in low position, maintain call bell within reach at all times, provide assistance with transfer out of bed and ambulation.  

## 2017-01-05 NOTE — Patient Instructions (Addendum)
____________________________________________________________________________________________  Post-Procedure instructions Instructions:  Apply ice: Fill a plastic sandwich bag with crushed ice. Cover it with a small towel and apply to injection site. Apply for 15 minutes then remove x 15 minutes. Repeat sequence on day of procedure, until you go to bed. The purpose is to minimize swelling and discomfort after procedure.  Apply heat: Apply heat to procedure site starting the day following the procedure. The purpose is to treat any soreness and discomfort from the procedure.  Food intake: Start with clear liquids (like water) and advance to regular food, as tolerated.   Physical activities: Keep activities to a minimum for the first 8 hours after the procedure.   Driving: If you have received any sedation, you are not allowed to drive for 24 hours after your procedure.  Blood thinner: Restart your blood thinner 6 hours after your procedure. (Only for those taking blood thinners)  Insulin: As soon as you can eat, you may resume your normal dosing schedule. (Only for those taking insulin)  Infection prevention: Keep procedure site clean and dry.  Post-procedure Pain Diary: Extremely important that this be done correctly and accurately. Recorded information will be used to determine the next step in treatment.  Pain evaluated is that of treated area only. Do not include pain from an untreated area.  Complete every hour, on the hour, for the initial 8 hours. Set an alarm to help you do this part accurately.  Do not go to sleep and have it completed later. It will not be accurate.  Follow-up appointment: Keep your follow-up appointment after the procedure. Usually 2 weeks for most procedures. (6 weeks in the case of radiofrequency.) Bring you pain diary.  Expect:  From numbing medicine (AKA: Local Anesthetics): Numbness or decrease in pain.  Onset: Full effect within 15 minutes of  injected.  Duration: It will depend on the type of local anesthetic used. On the average, 1 to 8 hours.   From steroids: Decrease in swelling or inflammation. Once inflammation is improved, relief of the pain will follow.  Onset of benefits: Depends on the amount of swelling present. The more swelling, the longer it will take for the benefits to be seen. In some cases, up to 10 days.  Duration: Steroids will stay in the system x 2 weeks. Duration of benefits will depend on multiple posibilities including persistent irritating factors.  From procedure: Some discomfort is to be expected once the numbing medicine wears off. This should be minimal if ice and heat are applied as instructed. Call if:  You experience numbness and weakness that gets worse with time, as opposed to wearing off.  New onset bowel or bladder incontinence. (Spinal procedures only)  Emergency Numbers:  Durning business hours (Monday - Thursday, 8:00 AM - 4:00 PM) (Friday, 9:00 AM - 12:00 Noon): [phone]  After hours: [phone] ____________________________________________________________________________________________  Pain Management Discharge Instructions  General Discharge Instructions :  If you need to reach your doctor call: Monday-Friday 8:00 am - 4:00 pm at [phone] or toll free [phone].  After clinic hours [phone] to have operator reach doctor.  Bring all of your medication bottles to all your appointments in the pain clinic.  To cancel or reschedule your appointment with Pain Management please remember to call 24 hours in advance to avoid a fee.  Refer to the educational materials which you have been given on: General Risks, I had my Procedure. Discharge Instructions, Post Sedation.  Post Procedure Instructions:  The drugs you   were given will stay in your system until tomorrow, so for the next 24 hours you should not drive, make any legal decisions or drink any alcoholic  beverages.  You may eat anything you prefer, but it is better to start with liquids then soups and crackers, and gradually work up to solid foods.  Please notify your doctor immediately if you have any unusual bleeding, trouble breathing or pain that is not related to your normal pain.  Depending on the type of procedure that was done, some parts of your body may feel week and/or numb.  This usually clears up by tonight or the next day.  Walk with the use of an assistive device or accompanied by an adult for the 24 hours.  You may use ice on the affected area for the first 24 hours.  Put ice in a Ziploc bag and cover with a towel and place against area 15 minutes on 15 minutes off.  You may switch to heat after 24 hours. 

## 2017-01-06 ENCOUNTER — Telehealth: Payer: Self-pay

## 2017-01-06 NOTE — Telephone Encounter (Signed)
Post procedure phone call.  Patient is doing ok.  Having some pain, but took Tylenol and it has subsided.

## 2017-01-26 NOTE — Progress Notes (Signed)
Results were reviewed and found to be: mildly abnormal  No acute injury or pathology identified  Review would suggest interventional pain management techniques may be of benefit 

## 2017-02-12 ENCOUNTER — Encounter: Payer: Self-pay | Admitting: Pain Medicine

## 2017-02-12 ENCOUNTER — Ambulatory Visit: Payer: Managed Care, Other (non HMO) | Attending: Pain Medicine | Admitting: Pain Medicine

## 2017-02-12 VITALS — BP 159/92 | HR 104 | Temp 98.7°F | Resp 16 | Ht 62.0 in | Wt 152.0 lb

## 2017-02-12 DIAGNOSIS — R350 Frequency of micturition: Secondary | ICD-10-CM | POA: Insufficient documentation

## 2017-02-12 DIAGNOSIS — Z85020 Personal history of malignant carcinoid tumor of stomach: Secondary | ICD-10-CM | POA: Insufficient documentation

## 2017-02-12 DIAGNOSIS — M170 Bilateral primary osteoarthritis of knee: Secondary | ICD-10-CM | POA: Diagnosis not present

## 2017-02-12 DIAGNOSIS — K219 Gastro-esophageal reflux disease without esophagitis: Secondary | ICD-10-CM | POA: Diagnosis not present

## 2017-02-12 DIAGNOSIS — R339 Retention of urine, unspecified: Secondary | ICD-10-CM | POA: Diagnosis not present

## 2017-02-12 DIAGNOSIS — Z803 Family history of malignant neoplasm of breast: Secondary | ICD-10-CM | POA: Diagnosis not present

## 2017-02-12 DIAGNOSIS — G4700 Insomnia, unspecified: Secondary | ICD-10-CM | POA: Insufficient documentation

## 2017-02-12 DIAGNOSIS — E782 Mixed hyperlipidemia: Secondary | ICD-10-CM | POA: Insufficient documentation

## 2017-02-12 DIAGNOSIS — Z881 Allergy status to other antibiotic agents status: Secondary | ICD-10-CM | POA: Diagnosis not present

## 2017-02-12 DIAGNOSIS — E039 Hypothyroidism, unspecified: Secondary | ICD-10-CM | POA: Diagnosis not present

## 2017-02-12 DIAGNOSIS — Z8249 Family history of ischemic heart disease and other diseases of the circulatory system: Secondary | ICD-10-CM | POA: Diagnosis not present

## 2017-02-12 DIAGNOSIS — M25561 Pain in right knee: Secondary | ICD-10-CM | POA: Insufficient documentation

## 2017-02-12 DIAGNOSIS — G8929 Other chronic pain: Secondary | ICD-10-CM

## 2017-02-12 DIAGNOSIS — M25562 Pain in left knee: Secondary | ICD-10-CM | POA: Insufficient documentation

## 2017-02-12 DIAGNOSIS — Z809 Family history of malignant neoplasm, unspecified: Secondary | ICD-10-CM | POA: Insufficient documentation

## 2017-02-12 DIAGNOSIS — R7309 Other abnormal glucose: Secondary | ICD-10-CM | POA: Insufficient documentation

## 2017-02-12 DIAGNOSIS — G4730 Sleep apnea, unspecified: Secondary | ICD-10-CM | POA: Diagnosis not present

## 2017-02-12 DIAGNOSIS — F419 Anxiety disorder, unspecified: Secondary | ICD-10-CM | POA: Insufficient documentation

## 2017-02-12 DIAGNOSIS — I10 Essential (primary) hypertension: Secondary | ICD-10-CM | POA: Diagnosis not present

## 2017-02-12 DIAGNOSIS — Z8261 Family history of arthritis: Secondary | ICD-10-CM | POA: Diagnosis not present

## 2017-02-12 NOTE — Progress Notes (Signed)
Safety precautions to be maintained throughout the outpatient stay will include: orient to surroundings, keep bed in low position, maintain call bell within reach at all times, provide assistance with transfer out of bed and ambulation.  

## 2017-02-12 NOTE — Progress Notes (Signed)
Patient's Name: Jordan Baker  MRN: [id number]  Referring Provider: Mar Daring, [place]*  DOB: 1960-09-09  PCP: Mar Daring, PA-C  DOS: 02/12/2017  Note by: Gaspar Cola, MD  Service setting: Ambulatory outpatient  Specialty: Interventional Pain Management  Location: [facility] (AMB) Pain Management Facility    Patient type: Established   Primary Reason(s) for Visit: Encounter for post-procedure evaluation of chronic illness with mild to moderate exacerbation CC: Knee Pain (bilateral)  HPI  Jordan Baker is a 56 y.o. year old, female patient, who comes today for a post-procedure evaluation. 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 Her primarily concern today is the Knee Pain (bilateral)  Pain Assessment: Location: Right, Left Knee Radiating: n/a Onset: More than a month ago Duration: Chronic pain Quality: Aching, Burning, Sharp Severity: 4 /10 (self-reported pain score)  Note: Reported level is compatible with observation.                   Effect on ADL:   Timing: Intermittent Modifying factors: rest, Tylenol Arthritis  Jordan Baker comes in today for post-procedure evaluation after the treatment done on 01/05/2017. Niko returns today indicating 80% improvement on the right knee but 0 on the left. Because of the problems on the left knee and the fact that she has already had 3 series of Synvisc injections on  the knees, I believe it is medically necessary for her to undergo an MRI. However insurance will not approve an order from anybody else other than an orthopedic surgeon and therefore we will be sending her for a consult with orthopedic surgeon so that he can make the determination as to whether or not it is appropriate for her to have this MRI. In terms of that left knee, she indicates having had those 3 Synvisc injections. The first one provided her with 12-18 months of pain relief. The second and third one provided her with 4-6 months of pain relief. She has had problems with both of her knees for years, but the left side seems to be a lot worse than the right. On 01/05/2017 I did a diagnostic bilateral intra-articular knee injection using lidocaine for the skin, ropivacaine intra-articularly, and 80 mg of Depo-Medrol. Although she attained close to 100% relief of the pain for the duration of the lidocaine on the left knee, she did not get any long-term benefit from the steroids. Today she will be referred to see Dr. Ronnie Derby in [place].   Further details on both, my assessment(s), as well as the proposed treatment plan, please see below.  Post-Procedure Assessment  01/05/2017 Procedure: Diagnostic bilateral intra-articular knee injection with local anesthetic and steroid Pre-procedure pain score:  3/10 Post-procedure pain score: 0/10 (100% relief) Influential Factors: BMI: 27.80 kg/m Intra-procedural challenges: None observed.         Assessment challenges: None detected.              Reported side-effects: None.        Post-procedural adverse reactions or complications: None reported         Sedation: No sedation used.  When no sedatives are used, the analgesic levels obtained are directly associated to the effectiveness of the local anesthetics. However, when sedation is provided, the level of analgesia obtained during the initial 1 hour following the intervention, is believed to be the result of  a combination of factors. These factors may include, but are not limited to: 1. The effectiveness of the local anesthetics used. 2. The effects of the analgesic(s) and/or anxiolytic(s) used. 3. The degree of discomfort experienced by the patient at the time of the procedure. 4. The patients ability and reliability in recalling and recording the events. 5. The presence and influence of possible secondary gains and/or psychosocial factors. Reported result: Relief experienced during the 1st hour after the procedure: 90 % (Ultra-Short Term Relief) Jordan Baker has indicated area to have been numb during this time. Interpretative annotation: Clinically appropriate result. Analgesia during this period is likely to be Local Anesthetic and/or IV Sedative (Analgesic/Anxiolytic) related.          Effects of local anesthetic: The analgesic effects attained during this period are directly associated to the localized infiltration of local anesthetics and therefore cary significant diagnostic value as to the etiological location, or anatomical origin, of the pain. Expected duration of relief is directly dependent on the pharmacodynamics of the local anesthetic used. Long-acting (4-6 hours) anesthetics used.  Reported result: Relief during the next 4 to 6 hour after the procedure: 0 % (0% on left, 80% on right) (Short-Term Relief)            Interpretative annotation: Clinically appropriate result. Analgesia during this period is likely to be Local Anesthetic-related.          Long-term benefit: Defined as the period of time past the expected duration of local anesthetics (1 hour for short-acting and 4-6 hours for long-acting). With the possible exception of prolonged sympathetic blockade from the local anesthetics, benefits during this period are typically attributed to, or associated with, other factors such as analgesic sensory neuropraxia, antiinflammatory effects, or beneficial biochemical changes provided by  agents other than the local anesthetics.  Reported result: Extended relief following procedure: 0 % (0% on left, 80% on right) (Long-Term Relief)            Interpretative annotation: Clinically appropriate result. Good relief. No permanent benefit expected. Inflammation plays a part in the etiology to the pain.          Current benefits: Defined as persistent relief that continues at this point in time.   Reported results: Treated area: 80 % on the right side and 0% on the left.       Interpretative annotation: Recurrence of symptoms. No permanent benefit expected. Effective diagnostic intervention.          Interpretation: Results would suggest a successful diagnostic intervention.                  Plan:  She has a long-standing history of knee problems. Because the left side did not improve, I believe it is medically necessary for her to undergo an MRI of that knee. Unfortunately, her insurance company will not approve an order from anybody else other than an Doctor, general practice. Therefore, we will be sending the patient to see an orthopedic surgeon in order to evaluate the knees and make his determination as to whether or not she needs an MRI.  Laboratory Chemistry  Inflammation Markers (CRP: Acute Phase) (ESR: Chronic Phase) No results found for: CRP, ESRSEDRATE  Renal Function Markers Lab Results  Component Value Date   BUN 11 06/10/2016   CREATININE 0.83 06/10/2016   GFRAA 92 06/10/2016   GFRNONAA 80 06/10/2016                 Hepatic Function Markers Lab Results  Component Value Date   AST 16 06/10/2016   ALT 11 06/10/2016   ALBUMIN 4.0 06/10/2016   ALKPHOS 67 06/10/2016                 Electrolytes Lab Results  Component Value Date   NA 142 06/10/2016   K 4.3 06/10/2016   CL 102 06/10/2016   CALCIUM 9.1 06/10/2016                 Neuropathy Markers No results found for: [id number]               Bone Pathology Markers Lab Results  Component Value  Date   ALKPHOS 67 06/10/2016   CALCIUM 9.1 06/10/2016                 Coagulation Parameters Lab Results  Component Value Date   PLT 351 06/10/2016                 Cardiovascular Markers Lab Results  Component Value Date   HGB 12.8 06/10/2016   HCT 38.2 06/10/2016                 Note: Lab results reviewed.  Recent Diagnostic Imaging Review  Dg C-arm 1-60 Min-no Report  Result Date: 01/05/2017 Fluoroscopy was utilized by the requesting physician.  No radiographic interpretation.   Note: Imaging results reviewed.          Meds   Current Meds  Medication Sig  . ALPRAZolam (XANAX) 0.5 MG tablet Take 1 tablet (0.5 mg total) by mouth at bedtime as needed for anxiety.  Marland Kitchen amitriptyline (ELAVIL) 25 MG tablet TAKE ONE TABLET BY MOUTH AT BEDTIME  . AVIANE 0.1-20 MG-MCG tablet TAKE ONE TABLET BY MOUTH ONCE DAILY  . celecoxib (CELEBREX) 200 MG capsule Take 200 mg by mouth at bedtime.  . cetirizine (ZYRTEC) 10 MG tablet Take by mouth.  . cyclobenzaprine (FLEXERIL) 10 MG tablet Take 1 tablet (10 mg total) by mouth 3 (three) times daily as needed for muscle spasms.  Marland Kitchen doxycycline (VIBRA-TABS) 100 MG tablet Take 100 mg by mouth as needed.   . fluticasone (FLONASE) 50 MCG/ACT nasal spray Place 2 sprays into both nostrils daily.  Marland Kitchen levothyroxine (SYNTHROID, LEVOTHROID) 125 MCG tablet Take 1 tablet (125 mcg total) by mouth daily.  . NON FORMULARY CPAP (Device) nightly  . omeprazole (PRILOSEC) 20 MG capsule TAKE ONE CAPSULE BY MOUTH ONCE DAILY  . pravastatin (PRAVACHOL) 40 MG tablet TAKE ONE TABLET BY MOUTH ONCE DAILY    ROS  Constitutional: Denies any fever or chills Gastrointestinal: No reported hemesis, hematochezia, vomiting, or acute GI distress Musculoskeletal: Denies any acute onset joint swelling, redness, loss of ROM, or weakness Neurological: No reported episodes of acute onset apraxia, aphasia, dysarthria, agnosia, amnesia, paralysis, loss of coordination, or loss of  consciousness  Allergies  Jordan Baker is allergic to nitrofurantoin monohyd macro.  [place]  Drug: Jordan Baker  reports that she does not use drugs. Alcohol:  reports that she drinks alcohol. Tobacco:  reports that she has never smoked. She has never used smokeless tobacco. Medical:  has a past medical history of Anxiety; GERD (gastroesophageal reflux disease); and Hyperlipidemia.  Surgical: Jordan Baker  has a past surgical history that includes Ovarian cyst surgery (1993); Cesarean section (1990/1993); Biopsy rectal; and EUS (N/A, 05/17/2015). Family: family history includes Arthritis in her maternal grandfather; Breast cancer in her paternal grandmother; Cancer in her paternal grandmother; Hyperlipidemia in her mother; Hypertension in her mother and sister.  Constitutional Exam  General appearance: Well nourished, well developed, and well hydrated. In no apparent acute distress Vitals:   02/12/17 1432  BP: (!) 159/92  Pulse: (!) 104  Resp: 16  Temp: 98.7 F (37.1 C)  TempSrc: Oral  SpO2: 98%  Weight: 152 lb (68.9 kg)  Height: '5\' 2"'  (1.575 m)   BMI Assessment: Estimated body mass index is 27.8 kg/m as calculated from the following:   Height as of this encounter: '5\' 2"'  (1.575 m).   Weight as of this encounter: 152 lb (68.9 kg).  BMI interpretation table: BMI level Category Range association with higher incidence of chronic pain  <18 kg/m2 Underweight   18.5-24.9 kg/m2 Ideal body weight   25-29.9 kg/m2 Overweight Increased incidence by 20%  30-34.9 kg/m2 Obese (Class I) Increased incidence by 68%  35-39.9 kg/m2 Severe obesity (Class II) Increased incidence by 136%  >40 kg/m2 Extreme obesity (Class III) Increased incidence by 254%   BMI Readings from Last 4 Encounters:  02/12/17 27.80 kg/m  01/05/17 27.80 kg/m  06/02/16 28.53 kg/m  05/31/15 27.91 kg/m   Wt Readings from Last 4 Encounters:  02/12/17 152 lb (68.9 kg)  01/05/17 152 lb (68.9 kg)  06/02/16 156 lb (70.8 kg)   05/31/15 152 lb 9.6 oz (69.2 kg)  Psych/Mental status: Alert, oriented x 3 (person, place, & time)       Eyes: PERLA Respiratory: No evidence of acute respiratory distress  Cervical Spine Exam  Inspection: No masses, redness, or swelling Alignment: Symmetrical Functional ROM: Unrestricted ROM      Stability: No instability detected Muscle strength & Tone: Functionally intact Sensory: Unimpaired Palpation: No palpable anomalies              Upper Extremity (UE) Exam    Side: Right upper extremity  Side: Left upper extremity  Inspection: No masses, redness, swelling, or asymmetry. No contractures  Inspection: No masses, redness, swelling, or asymmetry. No contractures  Functional ROM: Unrestricted ROM          Functional ROM: Unrestricted ROM          Muscle strength & Tone: Functionally intact  Muscle strength & Tone: Functionally intact  Sensory: Unimpaired  Sensory: Unimpaired  Palpation: No palpable anomalies              Palpation: No palpable anomalies              Specialized Test(s): Deferred         Specialized Test(s): Deferred          Thoracic Spine Exam  Inspection: No masses, redness, or swelling Alignment: Symmetrical Functional ROM: Unrestricted ROM Stability: No instability detected Sensory: Unimpaired Muscle strength & Tone: No palpable anomalies  Lumbar Spine Exam  Inspection: No masses, redness, or swelling Alignment: Symmetrical Functional ROM: Unrestricted ROM      Stability: No instability detected Muscle strength & Tone: Functionally intact Sensory: Unimpaired Palpation: No palpable anomalies       Provocative Tests: Lumbar Hyperextension and rotation test: evaluation deferred today       Lumbar Lateral bending test: evaluation deferred today       Patrick's Maneuver: evaluation deferred today  Gait & Posture Assessment  Ambulation: Limited Gait: Antalgic Posture: WNL   Lower Extremity Exam    Side: Right lower extremity   Side: Left lower extremity  Inspection: No masses, redness, swelling, or asymmetry. No contractures  Inspection: No masses, redness, swelling, or asymmetry. No contractures  Functional ROM: Unrestricted ROM          Functional ROM: Decreased ROM          Muscle strength & Tone: Functionally intact  Muscle strength & Tone: Guarding  Sensory: Unimpaired  Sensory: Arthropathic arthralgia  Palpation: No palpable anomalies  Palpation: Tender   Assessment  Primary Diagnosis & Pertinent Problem List: The primary encounter diagnosis was Chronic knee pain (Bilateral) (R>L). Diagnoses of Osteoarthritis of knee (Bilateral) (L>R) and Chronic Arthralgia of knees (Primary Area of Pain) (Bilateral) (L>R) were also pertinent to this visit.  Status Diagnosis  Controlled Controlled Controlled 1. Chronic knee pain (Bilateral) (R>L)   2. Osteoarthritis of knee (Bilateral) (L>R)   3. Chronic Arthralgia of knees (Primary Area of Pain) (Bilateral) (L>R)     Problems updated and reviewed during this visit: Problem  Chronic Arthralgia of knees (Primary Area of Pain) (Bilateral) (L>R)  Chronic knee pain (Primary Area of Pain) (Bilateral) (L>R)   Plan of Care  Pharmacotherapy (Medications Ordered): No orders of the defined types were placed in this encounter.  New Prescriptions   No medications on file   Medications administered today: Jordan Baker had no medications administered during this visit.  Orders:  Procedure Orders Placed     KNEE INJECTION     KNEE INJECTION      Referrals Placed     Ambulatory referral to Orthopedic Surgery  Interventional management options: Planned, scheduled, and/or pending:   Referral to orthopedic surgery to evaluate the left knee and determine if an MRI and/or surgery is warranted.    Considering:   Therapeutic bilateral intra-articular Hyalgan knee injection  Diagnostic bilateral Genicular nerve block  Possible bilateral Genicular nerve RFA  Palliative  bilateral intra-articular knee joint injection with local anesthetic and steroid    Palliative PRN treatment(s):   Palliative bilateral intra-articular knee joint injection with local anesthetic and steroid  Therapeutic bilateral intra-articular Hyalgan knee injections    Provider-requested follow-up: Return if symptoms worsen or fail to improve.  No future appointments. Primary Care Physician: Mar Daring, PA-C Location: [facility] Note by: Gaspar Cola, MD Date: 02/12/2017; Time: 4:40 PM  There are no Patient Instructions on file for this visit.

## 2017-02-19 DIAGNOSIS — G8929 Other chronic pain: Secondary | ICD-10-CM | POA: Insufficient documentation

## 2017-02-19 DIAGNOSIS — M25562 Pain in left knee: Secondary | ICD-10-CM

## 2017-02-19 DIAGNOSIS — M25561 Pain in right knee: Secondary | ICD-10-CM

## 2017-02-25 ENCOUNTER — Other Ambulatory Visit: Payer: Self-pay | Admitting: Orthopedic Surgery

## 2017-02-25 ENCOUNTER — Other Ambulatory Visit: Payer: Self-pay | Admitting: Physician Assistant

## 2017-02-25 DIAGNOSIS — M2391 Unspecified internal derangement of right knee: Secondary | ICD-10-CM

## 2017-02-25 DIAGNOSIS — M25561 Pain in right knee: Secondary | ICD-10-CM

## 2017-02-25 DIAGNOSIS — E039 Hypothyroidism, unspecified: Secondary | ICD-10-CM

## 2017-02-27 ENCOUNTER — Other Ambulatory Visit: Payer: Self-pay | Admitting: Orthopedic Surgery

## 2017-02-27 DIAGNOSIS — M25561 Pain in right knee: Secondary | ICD-10-CM

## 2017-03-04 ENCOUNTER — Ambulatory Visit: Payer: Managed Care, Other (non HMO)

## 2017-03-06 ENCOUNTER — Other Ambulatory Visit: Payer: Self-pay | Admitting: Physician Assistant

## 2017-03-06 DIAGNOSIS — Z1231 Encounter for screening mammogram for malignant neoplasm of breast: Secondary | ICD-10-CM

## 2017-03-10 ENCOUNTER — Other Ambulatory Visit: Payer: Self-pay

## 2017-03-19 ENCOUNTER — Other Ambulatory Visit: Payer: Self-pay

## 2017-04-10 ENCOUNTER — Ambulatory Visit
Admission: RE | Admit: 2017-04-10 | Discharge: 2017-04-10 | Disposition: A | Discharge status: Home / Self Care | Payer: Managed Care, Other (non HMO) | Source: Ambulatory Visit | Attending: Physician Assistant | Admitting: Physician Assistant

## 2017-04-10 DIAGNOSIS — Z1231 Encounter for screening mammogram for malignant neoplasm of breast: Secondary | ICD-10-CM

## 2017-04-17 ENCOUNTER — Other Ambulatory Visit: Payer: Self-pay | Admitting: Orthopedic Surgery

## 2017-04-17 DIAGNOSIS — M1712 Unilateral primary osteoarthritis, left knee: Secondary | ICD-10-CM

## 2017-04-21 ENCOUNTER — Ambulatory Visit
Admission: RE | Admit: 2017-04-21 | Discharge: 2017-04-21 | Disposition: A | Discharge status: Home / Self Care | Payer: Managed Care, Other (non HMO) | Source: Ambulatory Visit | Attending: Orthopedic Surgery | Admitting: Orthopedic Surgery

## 2017-04-21 DIAGNOSIS — M1712 Unilateral primary osteoarthritis, left knee: Secondary | ICD-10-CM

## 2017-05-07 ENCOUNTER — Telehealth: Payer: Self-pay | Admitting: Physician Assistant

## 2017-05-08 ENCOUNTER — Other Ambulatory Visit: Payer: Self-pay | Admitting: Physician Assistant

## 2017-05-08 DIAGNOSIS — K219 Gastro-esophageal reflux disease without esophagitis: Secondary | ICD-10-CM

## 2017-05-08 DIAGNOSIS — J301 Allergic rhinitis due to pollen: Secondary | ICD-10-CM

## 2017-05-14 ENCOUNTER — Ambulatory Visit
Admission: RE | Admit: 2017-05-14 | Discharge: 2017-05-14 | Disposition: A | Discharge status: Home / Self Care | Payer: Managed Care, Other (non HMO) | Source: Ambulatory Visit | Attending: Orthopedic Surgery | Admitting: Orthopedic Surgery

## 2017-05-14 DIAGNOSIS — M25461 Effusion, right knee: Secondary | ICD-10-CM | POA: Insufficient documentation

## 2017-05-14 DIAGNOSIS — M2391 Unspecified internal derangement of right knee: Secondary | ICD-10-CM

## 2017-05-14 DIAGNOSIS — M25561 Pain in right knee: Secondary | ICD-10-CM

## 2017-05-25 ENCOUNTER — Ambulatory Visit: Payer: Self-pay | Admitting: Physician Assistant

## 2017-05-25 ENCOUNTER — Encounter: Payer: Self-pay | Admitting: Physician Assistant

## 2017-05-25 VITALS — BP 170/80 | HR 118 | Temp 98.0°F

## 2017-05-25 DIAGNOSIS — R220 Localized swelling, mass and lump, head: Secondary | ICD-10-CM

## 2017-05-25 MED ORDER — PREDNISONE 10 MG PO TABS: 30.0000 mg | ORAL_TABLET | Freq: Every day | ORAL | 0 refills | Status: DC

## 2017-05-25 NOTE — Progress Notes (Signed)
S:  c/o sinus pain and pressure along the ethmoid area. No sinus drainage, cough, congestion, ear pain, fever, or chills. States she just feels really full through the nose. Makes it hard to wear her CPAP at night  O: vitals wnl, nad, tms clear, nasal mucosa pink and swollen, throat wnl, neck supple no lymph, lungs c t a, cv rrr  A: nasal mucosa swelling  P: pred 30mg  qd x 3d, f/u prn

## 2017-06-09 ENCOUNTER — Ambulatory Visit (INDEPENDENT_AMBULATORY_CARE_PROVIDER_SITE_OTHER): Payer: Managed Care, Other (non HMO) | Admitting: Physician Assistant

## 2017-06-09 ENCOUNTER — Other Ambulatory Visit: Payer: Self-pay

## 2017-06-09 ENCOUNTER — Encounter: Payer: Self-pay | Admitting: Physician Assistant

## 2017-06-09 VITALS — BP 150/90 | HR 84 | Temp 98.2°F | Resp 16 | Ht 62.0 in | Wt 156.0 lb

## 2017-06-09 DIAGNOSIS — F411 Generalized anxiety disorder: Secondary | ICD-10-CM

## 2017-06-09 DIAGNOSIS — I10 Essential (primary) hypertension: Secondary | ICD-10-CM | POA: Diagnosis not present

## 2017-06-09 DIAGNOSIS — Z0000 Encounter for general adult medical examination without abnormal findings: Secondary | ICD-10-CM | POA: Diagnosis not present

## 2017-06-09 DIAGNOSIS — R7309 Other abnormal glucose: Secondary | ICD-10-CM

## 2017-06-09 DIAGNOSIS — M792 Neuralgia and neuritis, unspecified: Secondary | ICD-10-CM | POA: Diagnosis not present

## 2017-06-09 DIAGNOSIS — Z6828 Body mass index (BMI) 28.0-28.9, adult: Secondary | ICD-10-CM | POA: Diagnosis not present

## 2017-06-09 DIAGNOSIS — E039 Hypothyroidism, unspecified: Secondary | ICD-10-CM | POA: Diagnosis not present

## 2017-06-09 DIAGNOSIS — E7800 Pure hypercholesterolemia, unspecified: Secondary | ICD-10-CM | POA: Diagnosis not present

## 2017-06-09 MED ORDER — AMITRIPTYLINE HCL 25 MG PO TABS: 25.0000 mg | ORAL_TABLET | Freq: Every day | ORAL | 3 refills | Status: DC

## 2017-06-09 MED ORDER — ALPRAZOLAM 0.5 MG PO TABS: 0.5000 mg | ORAL_TABLET | Freq: Every evening | ORAL | 3 refills | Status: DC | PRN

## 2017-06-09 MED ORDER — PRAVASTATIN SODIUM 80 MG PO TABS: 40.0000 mg | ORAL_TABLET | Freq: Every day | ORAL | 3 refills | Status: DC

## 2017-06-09 NOTE — Progress Notes (Signed)
Patient: Jordan Baker, Female    DOB: [birth year], 56 y.o.   MRN: [id number] Visit Date: 06/09/2017  Today's Provider: Mar Daring, PA-C   Chief Complaint  Patient presents with  . Annual Exam   Subjective:    Annual physical exam Jordan Baker is a 56 y.o. female who presents today for health maintenance and complete physical. She feels well. She reports exercising active with daily activites. She reports she is sleeping fairly well.  06/02/16 CPE 06/02/16 Pap-neg HPV-neg 04/10/17 Mammogram-BI-RADS 1 -----------------------------------------------------------------  Patient reports having irregular periods. Patient reports she is having menstrual cramps and headaches. Patient reports that last night she had a headache and checked her BP and was elevated. Pt reports that after a while she did recheck BP and it had decreased.   Patient reports she will have partial knee surgery in December.   Review of Systems  Constitutional: Negative.   HENT: Positive for sinus pressure.   Eyes: Negative.   Respiratory: Negative.   Cardiovascular: Negative.   Gastrointestinal: Positive for constipation.  Endocrine: Negative.   Genitourinary: Negative.   Musculoskeletal: Negative.   Skin: Negative.   Allergic/Immunologic: Negative.   Neurological: Positive for headaches.  Hematological: Negative.   Psychiatric/Behavioral: The patient is nervous/anxious.     Social History      She  reports that  has never smoked. she has never used smokeless tobacco. She reports that she drinks alcohol. She reports that she does not use drugs.       Social History   Socioeconomic History  . Marital status: Married    Spouse name: None  . Number of children: 2  . Years of education: None  . Highest education level: None  Social Needs  . Financial resource strain: None  . Food insecurity - worry: None  . Food insecurity - inability: None  . Transportation needs -  medical: None  . Transportation needs - non-medical: None  Occupational History    Employer: [employer]  Tobacco Use  . Smoking status: Never Smoker  . Smokeless tobacco: Never Used  Substance and Sexual Activity  . Alcohol use: Yes    Alcohol/week: 0.0 oz    Comment: Occasional  . Drug use: No  . Sexual activity: None  Other Topics Concern  . None  Social History Narrative  . None    Past Medical History:  Diagnosis Date  . Anxiety   . GERD (gastroesophageal reflux disease)   . Hyperlipidemia      Patient Active Problem List   Diagnosis Date Noted  . Chronic pain of both knees 02/19/2017  . Chronic Arthralgia of knees (Primary Area of Pain) (Bilateral) (L>R) 02/12/2017  . Osteoarthritis of knee (Bilateral) (L>R) 01/05/2017  . Chronic knee pain (Primary Area of Pain) (Bilateral) (L>R) 12/31/2016  . Incomplete emptying of bladder 06/22/2016  . Benign essential HTN 06/10/2016  . Hyperlipidemia, mixed 06/10/2016  . Stable angina pectoris ([place]) 06/10/2016  . Abnormal blood sugar 06/02/2016  . Fibroid 05/31/2015  . Hypercholesterolemia 05/31/2015  . Hot flash, menopausal 05/31/2015  . GERD (gastroesophageal reflux disease) 05/15/2015  . Hypothyroidism 05/15/2015  . Anxiety disorder 05/15/2015  . Sleep apnea 05/15/2015  . Insomnia 05/15/2015  . Neuroendocrine tumor 02/22/2013  . 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 Bladder infection, chronic 08/10/2012  . Female genital symptoms 08/10/2012  . Increased frequency of urination 08/10/2012    Past Surgical History:  Procedure Laterality Date  .  BIOPSY RECTAL    . CESAREAN SECTION  1990/1993   x2  . EUS N/A 05/17/2015   Procedure: LOWER ENDOSCOPIC ULTRASOUND (EUS);  Surgeon: Holly Bodily, MD;  Location: [facility] ENDOSCOPY;  Service: Gastroenterology;  Laterality: N/A;  . OVARIAN CYST SURGERY  1993    Family History        Family Status  Relation Name Status  . PGM  Deceased at age  74       died from old age  . Mother  Alive  . Father  Deceased at age 17       Lung cancer  . Sister 1 Alive  . MGM  Deceased at age 14       died from old age  . MGF  Deceased at age 37's       died from congestiv heart failure.  Marland Kitchen PGF  Deceased at age 66       died from old age  . Sister 2 Alive  . Son  Alive  . Son  Alive        Her family history includes Arthritis in her maternal grandfather; Breast cancer in her paternal grandmother; Cancer in her paternal grandmother; Hyperlipidemia in her mother; Hypertension in her mother and sister.     Allergies  Allergen Reactions  . Nitrofurantoin Monohyd Macro Rash     Current Outpatient Medications:  .  ALPRAZolam (XANAX) 0.5 MG tablet, Take 1 tablet (0.5 mg total) by mouth at bedtime as needed for anxiety., Disp: 90 tablet, Rfl: 3 .  amitriptyline (ELAVIL) 25 MG tablet, TAKE ONE TABLET BY MOUTH AT BEDTIME, Disp: 90 tablet, Rfl: 3 .  AVIANE 0.1-20 MG-MCG tablet, TAKE ONE TABLET BY MOUTH ONCE DAILY, Disp: 84 tablet, Rfl: 3 .  cetirizine (ZYRTEC) 10 MG tablet, Take by mouth., Disp: , Rfl:  .  cyclobenzaprine (FLEXERIL) 10 MG tablet, Take 1 tablet (10 mg total) by mouth 3 (three) times daily as needed for muscle spasms., Disp: 30 tablet, Rfl: 3 .  doxycycline (VIBRA-TABS) 100 MG tablet, Take 100 mg by mouth as needed. , Disp: , Rfl:  .  fluticasone (FLONASE) 50 MCG/ACT nasal spray, USE TWO SPRAY(S) IN EACH NOSTRIL ONCE DAILY, Disp: 48 g, Rfl: 1 .  levothyroxine (SYNTHROID, LEVOTHROID) 125 MCG tablet, TAKE ONE TABLET BY MOUTH ONCE DAILY, Disp: 90 tablet, Rfl: 1 .  NON FORMULARY, CPAP (Device) nightly, Disp: , Rfl:  .  omeprazole (PRILOSEC) 20 MG capsule, TAKE ONE CAPSULE BY MOUTH ONCE DAILY, Disp: 90 capsule, Rfl: 3 .  pravastatin (PRAVACHOL) 40 MG tablet, TAKE ONE TABLET BY MOUTH ONCE DAILY, Disp: 90 tablet, Rfl: 3   Patient Care Team: Mar Daring, PA-C as PCP - General (Family Medicine)      Objective:   Vitals: BP  (!) 160/100 (BP Location: Left Arm, Patient Position: Sitting, Cuff Size: Large)   Pulse 84   Temp 98.2 F (36.8 C)   Resp 16   Ht 5\' 2"  (1.575 m)   Wt 156 lb (70.8 kg)   LMP 05/10/2017 (Approximate)   SpO2 98%   BMI 28.53 kg/m    Vitals:   06/09/17 1013  BP: (!) 160/100  Pulse: 84  Resp: 16  Temp: 98.2 F (36.8 C)  SpO2: 98%  Weight: 156 lb (70.8 kg)  Height: 5\' 2"  (1.575 m)     Physical Exam  Constitutional: She is oriented to person, place, and time. She appears well-developed and well-nourished. No distress.  HENT:  Head: Normocephalic and atraumatic.  Right Ear: Hearing, tympanic membrane, external ear and ear canal normal.  Left Ear: Hearing, tympanic membrane, external ear and ear canal normal.  Nose: Nose normal.  Mouth/Throat: Uvula is midline, oropharynx is clear and moist and mucous membranes are normal. No oropharyngeal exudate.  Eyes: Conjunctivae and EOM are normal. Pupils are equal, round, and reactive to light. Right eye exhibits no discharge. Left eye exhibits no discharge. No scleral icterus.  Neck: Normal range of motion. Neck supple. No JVD present. Carotid bruit is not present. No tracheal deviation present. No thyromegaly present.  Cardiovascular: Normal rate, regular rhythm, normal heart sounds and intact distal pulses. Exam reveals no gallop and no friction rub.  No murmur heard. Pulmonary/Chest: Effort normal and breath sounds normal. No respiratory distress. She has no wheezes. She has no rales. She exhibits no tenderness.  Abdominal: Soft. Bowel sounds are normal. She exhibits no distension and no mass. There is no tenderness. There is no rebound and no guarding.  Musculoskeletal: Normal range of motion. She exhibits no edema or tenderness.  Lymphadenopathy:    She has no cervical adenopathy.  Neurological: She is alert and oriented to person, place, and time.  Skin: Skin is warm and dry. No rash noted. She is not diaphoretic.  Psychiatric: She  has a normal mood and affect. Her behavior is normal. Judgment and thought content normal.  Vitals reviewed.    Depression Screen PHQ 2/9 Scores 06/09/2017 02/12/2017 01/05/2017 06/02/2016  PHQ - 2 Score 0 0 0 0  PHQ- 9 Score 1 - - -      Assessment & Plan:     Routine Health Maintenance and Physical Exam  Exercise Activities and Dietary recommendations Goals    None      Immunization History  Administered Date(s) Administered  . Td 06/02/2016    Health Maintenance  Topic Date Due  . MAMMOGRAM  04/11/2019  . PAP SMEAR  06/03/2019  . COLONOSCOPY  12/16/2022  . TETANUS/TDAP  06/02/2026  . INFLUENZA VACCINE  Completed  . Hepatitis C Screening  Completed  . HIV Screening  Completed     Discussed health benefits of physical activity, and encouraged her to engage in regular exercise appropriate for her age and condition.    1. Annual physical exam Normal physical exam today. Will check labs as below and f/u pending lab results. If labs are stable and WNL she will not need to have these rechecked for one year at her next annual physical exam. She is to call the office in the meantime if she has any acute issue, questions or concerns.  2. Generalized anxiety disorder Stable. Diagnosis pulled for medication refill. Continue current medical treatment plan. - ALPRAZolam (XANAX) 0.5 MG tablet; Take 1 tablet (0.5 mg total) by mouth at bedtime as needed for anxiety.  Dispense: 90 tablet; Refill: 3  3. Nerve pain Stable. Diagnosis pulled for medication refill. Continue current medical treatment plan. - amitriptyline (ELAVIL) 25 MG tablet; Take 1 tablet (25 mg total) by mouth at bedtime.  Dispense: 90 tablet; Refill: 3  4. Hypercholesterolemia Dr. Nehemiah Massed increased pravastatin to 80mg . Updated as below. Will check labs as below and f/u pending results. - pravastatin (PRAVACHOL) 80 MG tablet; Take 0.5 tablets (40 mg total) by mouth daily.  Dispense: 90 tablet; Refill: 3 - CBC  w/Diff/Platelet - COMPLETE METABOLIC PANEL WITH GFR - Lipid Profile - HgB A1c  5. Benign essential HTN Elevated today. Patient is having knee pain  and menstrual headaches that may be contributing to elevation. Patient has been able to control with IBU currently. Reports last night after she got headache under control BP was 130s/70s. She is to keep a log of her BP x 1 week and send through mychart. If readings are up will start amlodipine 5mg . She is in agreement.  - CBC w/Diff/Platelet - COMPLETE METABOLIC PANEL WITH GFR - Lipid Profile - HgB A1c  6. Hypothyroidism, unspecified type Stable on levothyroxine 137mcg. Will check labs as below and f/u pending results. - CBC w/Diff/Platelet - COMPLETE METABOLIC PANEL WITH GFR - TSH  7. Abnormal blood sugar H/O this. Will check labs as below and f/u pending results. - CBC w/Diff/Platelet - COMPLETE METABOLIC PANEL WITH GFR - Lipid Profile - HgB A1c  8. BMI 28.0-28.9,adult Counseled patient on healthy lifestyle modifications including dieting and exercise.  Patient has been unable to exercise due to her bilteral knee pain. Hopeful to start exercise following her knee surgery scheduled in the next 2-3 weeks with Dr. Ronnie Derby.   --------------------------------------------------------------------    Mar Daring, PA-C  [place]

## 2017-06-09 NOTE — Patient Instructions (Signed)
Perimenopause Perimenopause is the time when your body begins to move into the menopause (no menstrual period for 12 straight months). It is a natural process. Perimenopause can begin 2-8 years before the menopause and usually lasts for 1 year after the menopause. During this time, your ovaries may or may not produce an egg. The ovaries vary in their production of estrogen and progesterone hormones each month. This can cause irregular menstrual periods, difficulty getting pregnant, vaginal bleeding between periods, and uncomfortable symptoms. What are the causes?  Irregular production of the ovarian hormones, estrogen and progesterone, and not ovulating every month. Other causes include:  Tumor of the pituitary gland in the brain.  Medical disease that affects the ovaries.  Radiation treatment.  Chemotherapy.  Unknown causes.  Heavy smoking and excessive alcohol intake can bring on perimenopause sooner. What are the signs or symptoms?  Hot flashes.  Night sweats.  Irregular menstrual periods.  Decreased sex drive.  Vaginal dryness.  Headaches.  Mood swings.  Depression.  Memory problems.  Irritability.  Tiredness.  Weight gain.  Trouble getting pregnant.  The beginning of losing bone cells (osteoporosis).  The beginning of hardening of the arteries (atherosclerosis). How is this diagnosed? Your health care provider will make a diagnosis by analyzing your age, menstrual history, and symptoms. He or she will do a physical exam and note any changes in your body, especially your female organs. Female hormone tests may or may not be helpful depending on the amount of female hormones you produce and when you produce them. However, other hormone tests may be helpful to rule out other problems. How is this treated? In some cases, no treatment is needed. The decision on whether treatment is necessary during the perimenopause should be made by you and your health care  provider based on how the symptoms are affecting you and your lifestyle. Various treatments are available, such as:  Treating individual symptoms with a specific medicine for that symptom.  Herbal medicines that can help specific symptoms.  Counseling.  Group therapy. Follow these instructions at home:  Keep track of your menstrual periods (when they occur, how heavy they are, how long between periods, and how long they last) as well as your symptoms and when they started.  Only take over-the-counter or prescription medicines as directed by your health care provider.  Sleep and rest.  Exercise.  Eat a diet that contains calcium (good for your bones) and soy (acts like the estrogen hormone).  Do not smoke.  Avoid alcoholic beverages.  Take vitamin supplements as recommended by your health care provider. Taking vitamin E may help in certain cases.  Take calcium and vitamin D supplements to help prevent bone loss.  Group therapy is sometimes helpful.  Acupuncture may help in some cases. Contact a health care provider if:  You have questions about any symptoms you are having.  You need a referral to a specialist (gynecologist, psychiatrist, or psychologist). Get help right away if:  You have vaginal bleeding.  Your period lasts longer than 8 days.  Your periods are recurring sooner than 21 days.  You have bleeding after intercourse.  You have severe depression.  You have pain when you urinate.  You have severe headaches.  You have vision problems. This information is not intended to replace advice given to you by your health care provider. Make sure you discuss any questions you have with your health care provider. Document Released: 08/07/2004 Document Revised: 12/06/2015 Document Reviewed: 01/27/2013 Elsevier Interactive   Patient Education  2017 Elsevier Inc.  

## 2017-06-10 ENCOUNTER — Telehealth: Payer: Self-pay

## 2017-06-10 LAB — COMPLETE METABOLIC PANEL WITH GFR
AG RATIO: 1.7 (calc) (ref 1.0–2.5)
ALT: 9 U/L (ref 6–29)
AST: 11 U/L (ref 10–35)
Albumin: 4 g/dL (ref 3.6–5.1)
Alkaline phosphatase (APISO): 56 U/L (ref 33–130)
BUN: 10 mg/dL (ref 7–25)
CALCIUM: 9.1 mg/dL (ref 8.6–10.4)
CO2: 26 mmol/L (ref 20–32)
CREATININE: 0.71 mg/dL (ref 0.50–1.05)
Chloride: 103 mmol/L (ref 98–110)
GFR, EST AFRICAN AMERICAN: 111 mL/min/{1.73_m2} (ref >=60)
GFR, EST NON AFRICAN AMERICAN: 96 mL/min/{1.73_m2} (ref >=60)
Globulin: 2.4 g/dL (calc) (ref 1.9–3.7)
Glucose, Bld: 80 mg/dL (ref 65–99)
POTASSIUM: 4.1 mmol/L (ref 3.5–5.3)
Sodium: 137 mmol/L (ref 135–146)
TOTAL PROTEIN: 6.4 g/dL (ref 6.1–8.1)
Total Bilirubin: 0.3 mg/dL (ref 0.2–1.2)

## 2017-06-10 LAB — CBC WITH DIFFERENTIAL/PLATELET
BASOS ABS: 28 {cells}/uL (ref 0–200)
Basophils Relative: 0.6 %
EOS ABS: 99 {cells}/uL (ref 15–500)
EOS PCT: 2.1 %
HEMATOCRIT: 38.5 % (ref 35.0–45.0)
HEMOGLOBIN: 12.7 g/dL (ref 11.7–15.5)
Lymphs Abs: 1297 cells/uL (ref 850–3900)
MCH: 29.1 pg (ref 27.0–33.0)
MCHC: 33 g/dL (ref 32.0–36.0)
MCV: 88.1 fL (ref 80.0–100.0)
MONOS PCT: 6.4 %
MPV: 10 fL (ref 7.5–12.5)
NEUTROS ABS: 2975 {cells}/uL (ref 1500–7800)
NEUTROS PCT: 63.3 %
PLATELETS: 341 10*3/uL (ref 140–400)
RBC: 4.37 10*6/uL (ref 3.80–5.10)
RDW: 12.1 % (ref 11.0–15.0)
Total Lymphocyte: 27.6 %
WBC mixed population: 301 cells/uL (ref 200–950)
WBC: 4.7 10*3/uL (ref 3.8–10.8)

## 2017-06-10 LAB — HEMOGLOBIN A1C
Hgb A1c MFr Bld: 5.5 % of total Hgb (ref <5.7)
Mean Plasma Glucose: 111 (calc)
eAG (mmol/L): 6.2 (calc)

## 2017-06-10 LAB — LIPID PANEL
CHOL/HDL RATIO: 3.7 (calc) (ref <5.0)
Cholesterol: 198 mg/dL (ref <200)
HDL: 53 mg/dL (ref >50)
LDL CHOLESTEROL (CALC): 124 mg/dL — AB
NON-HDL CHOLESTEROL (CALC): 145 mg/dL — AB (ref <130)
TRIGLYCERIDES: 106 mg/dL (ref <150)

## 2017-06-10 LAB — TSH: TSH: 1.12 m[IU]/L

## 2017-06-10 NOTE — Telephone Encounter (Signed)
-----   Message from Mar Daring, PA-C sent at 06/10/2017  8:19 AM EST ----- All labs are within normal limits and stable.  Thanks! -JB

## 2017-06-10 NOTE — Telephone Encounter (Signed)
Patient advised as below.  

## 2017-06-11 ENCOUNTER — Telehealth: Payer: Self-pay | Admitting: Physician Assistant

## 2017-06-11 DIAGNOSIS — I10 Essential (primary) hypertension: Secondary | ICD-10-CM

## 2017-06-11 MED ORDER — AMLODIPINE BESYLATE 5 MG PO TABS: 5.0000 mg | ORAL_TABLET | Freq: Every day | ORAL | 3 refills | Status: DC

## 2017-06-11 NOTE — Telephone Encounter (Signed)
Amlodipine sent to [place]. Needs appt in 4-6 weeks for BP recheck

## 2017-06-11 NOTE — Telephone Encounter (Signed)
Pt stated that she is still having the off and headaches & her BP hasn't improved. Pt stated she is ok with trying the BP medication that was discussed at [place]. Pt requested it to be sent to [facility]. Please advise. Thanks TNP

## 2017-06-11 NOTE — Telephone Encounter (Signed)
Pt advised as below. Pt will call later on to schedule appt.

## 2017-06-13 HISTORY — PX: JOINT REPLACEMENT: SHX530

## 2017-06-24 HISTORY — PX: OTHER SURGICAL HISTORY: SHX169

## 2017-07-02 ENCOUNTER — Telehealth: Payer: Self-pay | Admitting: Physician Assistant

## 2017-07-02 DIAGNOSIS — Z96652 Presence of left artificial knee joint: Secondary | ICD-10-CM | POA: Insufficient documentation

## 2017-07-02 NOTE — Telephone Encounter (Signed)
Pt said she was suppose to come back in for a recheck with you from 11/26 visit about this time but she just seen Dr. Lalla Brothers and everything was good.  Does she have to come in.?  She did ask if she could get a new order for a c-pap machine.  Her call back is [phone]  Thanks Con Memos

## 2017-07-02 NOTE — Telephone Encounter (Signed)
Pt advised. She will call back to make another appt because she is trying to get her Cpap in before the end of the year and if the [facility] does not think she will be able to then she will schedule for around the first of the year.

## 2017-07-02 NOTE — Telephone Encounter (Signed)
Please advise. Thanks.  

## 2017-07-02 NOTE — Telephone Encounter (Signed)
She does not need a f/u for that but would need one for face to face for the new CPAP and I can get that order for her then

## 2017-07-23 ENCOUNTER — Other Ambulatory Visit: Payer: Self-pay | Admitting: Physician Assistant

## 2017-07-23 DIAGNOSIS — M62830 Muscle spasm of back: Secondary | ICD-10-CM

## 2017-08-07 ENCOUNTER — Other Ambulatory Visit: Payer: Self-pay | Admitting: Physician Assistant

## 2017-08-07 DIAGNOSIS — E039 Hypothyroidism, unspecified: Secondary | ICD-10-CM

## 2017-08-13 ENCOUNTER — Other Ambulatory Visit: Payer: Self-pay | Admitting: Physician Assistant

## 2017-08-13 DIAGNOSIS — Z3041 Encounter for surveillance of contraceptive pills: Secondary | ICD-10-CM

## 2017-11-17 ENCOUNTER — Encounter: Payer: Self-pay | Admitting: Physician Assistant

## 2017-11-17 ENCOUNTER — Ambulatory Visit (INDEPENDENT_AMBULATORY_CARE_PROVIDER_SITE_OTHER): Payer: Managed Care, Other (non HMO) | Admitting: Physician Assistant

## 2017-11-17 VITALS — BP 130/70 | HR 105 | Temp 98.3°F | Resp 16 | Ht 62.0 in | Wt 158.0 lb

## 2017-11-17 DIAGNOSIS — G4733 Obstructive sleep apnea (adult) (pediatric): Secondary | ICD-10-CM | POA: Diagnosis not present

## 2017-11-17 NOTE — Progress Notes (Signed)
Patient: Jordan Baker Female    DOB: 06-16-1961   57 y.o.   MRN: [id number] Visit Date: 11/17/2017  Today's Provider: Mar Daring, PA-C   Chief Complaint  Patient presents with  . Follow-up   Subjective:    HPI Patient here today for a face to face to replace CPAP machine. Patient reports good tolerance and compliance with machine. Patient has been using CPAP since 2012.  She uses nightly. She brought a picture of her compliance over the last 30 days. She used for 29/30 days. The one night she did not use was due to nasal congestion. She used for average of 7.3 hours nightly and a total of 219.8 hours through the month.     Allergies  Allergen Reactions  . Nitrofurantoin Monohyd Macro Rash     Current Outpatient Medications:  .  ALPRAZolam (XANAX) 0.5 MG tablet, Take 1 tablet (0.5 mg total) by mouth at bedtime as needed for anxiety., Disp: 90 tablet, Rfl: 3 .  amitriptyline (ELAVIL) 25 MG tablet, Take 1 tablet (25 mg total) by mouth at bedtime., Disp: 90 tablet, Rfl: 3 .  amLODipine (NORVASC) 5 MG tablet, Take 1 tablet (5 mg total) by mouth daily., Disp: 90 tablet, Rfl: 3 .  AVIANE 0.1-20 MG-MCG tablet, TAKE ONE TABLET BY MOUTH ONCE DAILY, Disp: 84 tablet, Rfl: 3 .  cetirizine (ZYRTEC) 10 MG tablet, Take by mouth., Disp: , Rfl:  .  cyclobenzaprine (FLEXERIL) 10 MG tablet, TAKE ONE TABLET BY MOUTH THREE TIMES DAILY AS NEEDED FOR MUSCLE SPASM, Disp: 30 tablet, Rfl: 3 .  doxycycline (VIBRA-TABS) 100 MG tablet, Take 100 mg by mouth as needed. , Disp: , Rfl:  .  fluticasone (FLONASE) 50 MCG/ACT nasal spray, USE TWO SPRAY(S) IN EACH NOSTRIL ONCE DAILY, Disp: 48 g, Rfl: 1 .  levothyroxine (SYNTHROID, LEVOTHROID) 125 MCG tablet, TAKE 1 TABLET BY MOUTH ONCE DAILY, Disp: 90 tablet, Rfl: 1 .  NON FORMULARY, CPAP (Device) nightly, Disp: , Rfl:  .  omeprazole (PRILOSEC) 20 MG capsule, TAKE ONE CAPSULE BY MOUTH ONCE DAILY, Disp: 90 capsule, Rfl: 3 .  pravastatin (PRAVACHOL) 80  MG tablet, Take 0.5 tablets (40 mg total) by mouth daily., Disp: 90 tablet, Rfl: 3  Review of Systems  Constitutional: Negative.   Respiratory: Negative.   Cardiovascular: Negative.   Neurological: Negative.   Psychiatric/Behavioral: Negative.     Social History   Tobacco Use  . Smoking status: Never Smoker  . Smokeless tobacco: Never Used  Substance Use Topics  . Alcohol use: Yes    Alcohol/week: 0.0 oz    Comment: Occasional   Objective:   BP 130/70 (BP Location: Left Arm, Patient Position: Sitting, Cuff Size: Normal)   Pulse (!) 105   Temp 98.3 F (36.8 C) (Oral)   Resp 16   Ht 5\' 2"  (1.575 m)   Wt 158 lb (71.7 kg)   LMP 11/01/2017 (Exact Date)   SpO2 96%   BMI 28.90 kg/m  Vitals:   11/17/17 1150  BP: 130/70  Pulse: (!) 105  Resp: 16  Temp: 98.3 F (36.8 C)  TempSrc: Oral  SpO2: 96%  Weight: 158 lb (71.7 kg)  Height: 5\' 2"  (1.575 m)     Physical Exam  Constitutional: She appears well-developed and well-nourished. No distress.  Neck: Normal range of motion. Neck supple.  Cardiovascular: Normal rate, regular rhythm and normal heart sounds. Exam reveals no gallop and no friction rub.  No murmur  heard. Pulmonary/Chest: Effort normal and breath sounds normal. No respiratory distress. She has no wheezes. She has no rales.  Skin: She is not diaphoretic.  Vitals reviewed.      Assessment & Plan:     1. Obstructive sleep apnea syndrome Stable and well controlled with CPAP. New Rx for CPAP will be written and faxed to [facility] for a new CPAP machine. Current machine she has used since 2012.        Mar Daring, PA-C  [facility]

## 2017-11-19 ENCOUNTER — Encounter: Payer: Self-pay | Admitting: Physician Assistant

## 2017-12-01 ENCOUNTER — Other Ambulatory Visit: Payer: Self-pay | Admitting: Physician Assistant

## 2017-12-01 DIAGNOSIS — J301 Allergic rhinitis due to pollen: Secondary | ICD-10-CM

## 2017-12-11 ENCOUNTER — Telehealth: Payer: Self-pay | Admitting: Physician Assistant

## 2017-12-11 NOTE — Telephone Encounter (Signed)
pt called saying [facility] recd an order supplies for her CPAP but pt said she needs a new CPAP.  The order has to specify it needs to be a new CPAP machine.  She ask that you send a new Rx to [facility] for her.  Pt's call back Is  [phone]  Thanks Con Memos

## 2017-12-14 NOTE — Telephone Encounter (Signed)
Rx hand written and will be given to [facility] or [place] for faxing to [facility].

## 2017-12-15 ENCOUNTER — Telehealth: Payer: Self-pay | Admitting: Physician Assistant

## 2017-12-15 NOTE — Telephone Encounter (Signed)
New prescription for CPAP machine faxed.  Thanks,  -Joseline

## 2017-12-15 NOTE — Telephone Encounter (Signed)
Pt called back with a fax number to [facility]  It is [phone].  Con Memos

## 2018-01-15 ENCOUNTER — Ambulatory Visit (INDEPENDENT_AMBULATORY_CARE_PROVIDER_SITE_OTHER): Payer: Managed Care, Other (non HMO) | Admitting: Physician Assistant

## 2018-01-15 ENCOUNTER — Encounter: Payer: Self-pay | Admitting: Physician Assistant

## 2018-01-15 VITALS — BP 124/80 | HR 84 | Temp 98.5°F | Resp 16 | Ht 62.0 in | Wt 159.0 lb

## 2018-01-15 DIAGNOSIS — N390 Urinary tract infection, site not specified: Secondary | ICD-10-CM | POA: Diagnosis not present

## 2018-01-15 DIAGNOSIS — Z6829 Body mass index (BMI) 29.0-29.9, adult: Secondary | ICD-10-CM

## 2018-01-15 DIAGNOSIS — F411 Generalized anxiety disorder: Secondary | ICD-10-CM

## 2018-01-15 DIAGNOSIS — K219 Gastro-esophageal reflux disease without esophagitis: Secondary | ICD-10-CM

## 2018-01-15 MED ORDER — DOXYCYCLINE HYCLATE 100 MG PO TABS: 100.0000 mg | ORAL_TABLET | ORAL | 1 refills | Status: DC | PRN

## 2018-01-15 MED ORDER — ALPRAZOLAM 0.5 MG PO TABS: 0.5000 mg | ORAL_TABLET | Freq: Every evening | ORAL | 3 refills | Status: DC | PRN

## 2018-01-15 MED ORDER — PANTOPRAZOLE SODIUM 40 MG PO TBEC: 40.0000 mg | DELAYED_RELEASE_TABLET | Freq: Every day | ORAL | 3 refills | Status: DC

## 2018-01-15 NOTE — Progress Notes (Signed)
Patient: Jordan Baker Female    DOB: [birth year]   57 y.o.   MRN: [id number] Visit Date: 01/15/2018  Today's Provider: Mar Daring, PA-C   Chief Complaint  Patient presents with  . Gastroesophageal Reflux   Subjective:    HPI Patient here today C/O worsening heart burn in the last 2-3 weeks. Patient reports symptoms are worse at night. Patient reports she has been taking Prilosec 20 mg for several years. Patient reports that in the last 2-3 night she has taken Zantac 150 mg and Tums PRN and last night was able to sleep well. Patient denies any vomiting, nausea, diarrhea. Patient reports she also started a probiotic reports no help with symptoms.    Allergies  Allergen Reactions  . Nitrofurantoin Monohyd Macro Rash     Current Outpatient Medications:  .  ALPRAZolam (XANAX) 0.5 MG tablet, Take 1 tablet (0.5 mg total) by mouth at bedtime as needed for anxiety., Disp: 90 tablet, Rfl: 3 .  amitriptyline (ELAVIL) 25 MG tablet, Take 1 tablet (25 mg total) by mouth at bedtime., Disp: 90 tablet, Rfl: 3 .  amLODipine (NORVASC) 5 MG tablet, Take 1 tablet (5 mg total) by mouth daily., Disp: 90 tablet, Rfl: 3 .  AVIANE 0.1-20 MG-MCG tablet, TAKE ONE TABLET BY MOUTH ONCE DAILY, Disp: 84 tablet, Rfl: 3 .  cetirizine (ZYRTEC) 10 MG tablet, Take by mouth., Disp: , Rfl:  .  cyclobenzaprine (FLEXERIL) 10 MG tablet, TAKE ONE TABLET BY MOUTH THREE TIMES DAILY AS NEEDED FOR MUSCLE SPASM, Disp: 30 tablet, Rfl: 3 .  doxycycline (VIBRA-TABS) 100 MG tablet, Take 100 mg by mouth as needed. , Disp: , Rfl:  .  fluticasone (FLONASE) 50 MCG/ACT nasal spray, USE 2 SPRAY(S) IN EACH NOSTRIL ONCE DAILY, Disp: 48 g, Rfl: 1 .  levothyroxine (SYNTHROID, LEVOTHROID) 125 MCG tablet, TAKE 1 TABLET BY MOUTH ONCE DAILY, Disp: 90 tablet, Rfl: 1 .  NON FORMULARY, CPAP (Device) nightly, Disp: , Rfl:  .  omeprazole (PRILOSEC) 20 MG capsule, TAKE ONE CAPSULE BY MOUTH ONCE DAILY, Disp: 90 capsule, Rfl: 3 .   pravastatin (PRAVACHOL) 80 MG tablet, Take 0.5 tablets (40 mg total) by mouth daily., Disp: 90 tablet, Rfl: 3  Review of Systems  Constitutional: Positive for appetite change.  Respiratory: Negative.   Cardiovascular: Negative.   Gastrointestinal: Positive for abdominal pain and constipation.  Genitourinary: Negative.   Neurological: Negative.   Psychiatric/Behavioral: Negative.     Social History   Tobacco Use  . Smoking status: Never Smoker  . Smokeless tobacco: Never Used  Substance Use Topics  . Alcohol use: Yes    Alcohol/week: 0.0 oz    Comment: Occasional   Objective:   There were no vitals taken for this visit. There were no vitals filed for this visit.   Physical Exam  Constitutional: She is oriented to person, place, and time. She appears well-developed and well-nourished. No distress.  Cardiovascular: Normal rate, regular rhythm and normal heart sounds. Exam reveals no gallop and no friction rub.  No murmur heard. Pulmonary/Chest: Effort normal and breath sounds normal. No respiratory distress. She has no wheezes. She has no rales.  Abdominal: Soft. Normal appearance and bowel sounds are normal. She exhibits no distension and no mass. There is no hepatosplenomegaly. There is tenderness in the epigastric area. There is no rebound, no guarding and no CVA tenderness.  Neurological: She is alert and oriented to person, place, and time.  Skin: Skin is  warm and dry. She is not diaphoretic.  Vitals reviewed.      Assessment & Plan:     1. Gastroesophageal reflux disease without esophagitis Omeprazole ineffective over last 2-3 weeks. Will change therapy to pantoprazole as below. May still use zantac and tums prn as well.  - pantoprazole (PROTONIX) 40 MG tablet; Take 1 tablet (40 mg total) by mouth daily.  Dispense: 30 tablet; Refill: 3  2. Recurrent UTI Previously followed by Dr. Jacqlyn Larsen. He would allow her to have a Rx for doxycycline that she would use just after  intercourse. Since Dr. Jacqlyn Larsen has left we will take over this Rx since she is stable on current treatment.  - doxycycline (VIBRA-TABS) 100 MG tablet; Take 1 tablet (100 mg total) by mouth as needed.  Dispense: 30 tablet; Refill: 1  3. Generalized anxiety disorder Stable. Diagnosis pulled for medication refill. Continue current medical treatment plan. - ALPRAZolam (XANAX) 0.5 MG tablet; Take 1 tablet (0.5 mg total) by mouth at bedtime as needed for anxiety.  Dispense: 90 tablet; Refill: 3  4. BMI 29.0-29.9,adult Counseled patient on healthy lifestyle modifications including dieting and exercise.        Mar Daring, PA-C  [facility]

## 2018-01-18 ENCOUNTER — Other Ambulatory Visit: Payer: Self-pay | Admitting: Physician Assistant

## 2018-01-18 DIAGNOSIS — N390 Urinary tract infection, site not specified: Secondary | ICD-10-CM

## 2018-01-18 MED ORDER — DOXYCYCLINE HYCLATE 100 MG PO TABS: 100.0000 mg | ORAL_TABLET | Freq: Every day | ORAL | 1 refills | Status: DC

## 2018-01-18 NOTE — Progress Notes (Signed)
Updated doxycycline

## 2018-01-28 ENCOUNTER — Other Ambulatory Visit: Payer: Self-pay | Admitting: Physician Assistant

## 2018-01-28 DIAGNOSIS — E039 Hypothyroidism, unspecified: Secondary | ICD-10-CM

## 2018-02-02 DIAGNOSIS — M7052 Other bursitis of knee, left knee: Secondary | ICD-10-CM | POA: Insufficient documentation

## 2018-03-08 ENCOUNTER — Other Ambulatory Visit: Payer: Self-pay | Admitting: Physician Assistant

## 2018-03-08 DIAGNOSIS — Z1231 Encounter for screening mammogram for malignant neoplasm of breast: Secondary | ICD-10-CM

## 2018-03-09 ENCOUNTER — Other Ambulatory Visit: Payer: Self-pay | Admitting: Physician Assistant

## 2018-03-09 DIAGNOSIS — M62830 Muscle spasm of back: Secondary | ICD-10-CM

## 2018-03-12 ENCOUNTER — Encounter: Payer: Self-pay | Admitting: Family Medicine

## 2018-03-12 ENCOUNTER — Ambulatory Visit (INDEPENDENT_AMBULATORY_CARE_PROVIDER_SITE_OTHER): Payer: Managed Care, Other (non HMO) | Admitting: Family Medicine

## 2018-03-12 VITALS — BP 128/62 | HR 88 | Temp 99.1°F | Resp 16 | Wt 159.0 lb

## 2018-03-12 DIAGNOSIS — R1013 Epigastric pain: Secondary | ICD-10-CM | POA: Diagnosis not present

## 2018-03-12 DIAGNOSIS — K219 Gastro-esophageal reflux disease without esophagitis: Secondary | ICD-10-CM

## 2018-03-12 DIAGNOSIS — R1011 Right upper quadrant pain: Secondary | ICD-10-CM

## 2018-03-12 NOTE — Patient Instructions (Signed)
Minimize fatty food intake pending lab work and ultrasound. We willl call you with the ultrasound results.

## 2018-03-12 NOTE — Progress Notes (Signed)
  Subjective:     Patient ID: Jordan Baker, female   DOB: 1960-12-23, 57 y.o.   MRN: [id number] Chief Complaint  Patient presents with  . Abdominal Pain    Patient comes in today c/o "belly pain". She reports that she was seen by Grace Bushy, and she changed her PPI from omeprazole to Protonix. Patient reports that her stomach does not feel any better.   HPI States heartburn has improved but still has epigastric discomfort. Also reports intermittent RUQ pain after meals. 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  Review of Systems     Objective:   Physical Exam  Constitutional: She appears well-developed and well-nourished. No distress.  Pulmonary/Chest: Breath sounds normal.  Abdominal: Bowel sounds are normal. There is no tenderness. There is no guarding and negative Murphy's sign.       Assessment:    1. Right upper quadrant abdominal pain - Comprehensive metabolic panel - US Abdomen Complete; Future  2. Gastroesophageal reflux disease without esophagitis: continue Protonix  3. Epigastric pain - Comprehensive metabolic panel - CBC with Differential/Platelet - Lipase - H. pylori breath test - US Abdomen Complete; Future    Plan:    Discussed minimizing fatty food intake. Further f/u pending lab and ultrasound results.

## 2018-03-13 LAB — COMPREHENSIVE METABOLIC PANEL
A/G RATIO: 1.6 (ref 1.2–2.2)
ALBUMIN: 4.4 g/dL (ref 3.5–5.5)
ALT: 10 IU/L (ref 0–32)
AST: 11 IU/L (ref 0–40)
Alkaline Phosphatase: 70 IU/L (ref 39–117)
BUN/Creatinine Ratio: 19 (ref 9–23)
BUN: 15 mg/dL (ref 6–24)
Bilirubin Total: 0.2 mg/dL (ref 0.0–1.2)
CALCIUM: 9.6 mg/dL (ref 8.7–10.2)
CO2: 21 mmol/L (ref 20–29)
Chloride: 101 mmol/L (ref 96–106)
Creatinine, Ser: 0.79 mg/dL (ref 0.57–1.00)
GFR calc Af Amer: 97 mL/min/{1.73_m2} (ref >59)
GFR, EST NON AFRICAN AMERICAN: 84 mL/min/{1.73_m2} (ref >59)
GLOBULIN, TOTAL: 2.7 g/dL (ref 1.5–4.5)
GLUCOSE: 88 mg/dL (ref 65–99)
POTASSIUM: 4.4 mmol/L (ref 3.5–5.2)
SODIUM: 139 mmol/L (ref 134–144)
TOTAL PROTEIN: 7.1 g/dL (ref 6.0–8.5)

## 2018-03-13 LAB — CBC WITH DIFFERENTIAL/PLATELET
BASOS: 1 %
Basophils Absolute: 0.1 10*3/uL (ref 0.0–0.2)
EOS (ABSOLUTE): 0.1 10*3/uL (ref 0.0–0.4)
EOS: 2 %
HEMATOCRIT: 40.4 % (ref 34.0–46.6)
HEMOGLOBIN: 13.4 g/dL (ref 11.1–15.9)
IMMATURE GRANS (ABS): 0 10*3/uL (ref 0.0–0.1)
IMMATURE GRANULOCYTES: 0 %
LYMPHS: 22 %
Lymphocytes Absolute: 1.7 10*3/uL (ref 0.7–3.1)
MCH: 28.9 pg (ref 26.6–33.0)
MCHC: 33.2 g/dL (ref 31.5–35.7)
MCV: 87 fL (ref 79–97)
MONOCYTES: 6 %
MONOS ABS: 0.4 10*3/uL (ref 0.1–0.9)
NEUTROS PCT: 69 %
Neutrophils Absolute: 5.1 10*3/uL (ref 1.4–7.0)
Platelets: 400 10*3/uL (ref 150–450)
RBC: 4.63 x10E6/uL (ref 3.77–5.28)
RDW: 12.3 % (ref 12.3–15.4)
WBC: 7.4 10*3/uL (ref 3.4–10.8)

## 2018-03-13 LAB — LIPASE: Lipase: 25 U/L (ref 14–72)

## 2018-03-14 LAB — H. PYLORI BREATH TEST: H pylori Breath Test: NEGATIVE

## 2018-03-16 ENCOUNTER — Telehealth: Payer: Self-pay

## 2018-03-16 NOTE — Telephone Encounter (Signed)
-----   Message from Carmon Ginsberg, [place] sent at 03/16/2018  7:32 AM EDT ----- All labs are normal-pancreas,liver are ok. Helicobacter test is negative. Proceed with abdominal ultrasound.

## 2018-03-16 NOTE — Telephone Encounter (Signed)
Patient advised.KW 

## 2018-03-18 ENCOUNTER — Ambulatory Visit: Payer: Managed Care, Other (non HMO) | Admitting: Physician Assistant

## 2018-03-23 ENCOUNTER — Telehealth: Payer: Self-pay

## 2018-03-23 ENCOUNTER — Ambulatory Visit
Admission: RE | Admit: 2018-03-23 | Discharge: 2018-03-23 | Disposition: A | Discharge status: Home / Self Care | Payer: Managed Care, Other (non HMO) | Source: Ambulatory Visit | Attending: Family Medicine | Admitting: Family Medicine

## 2018-03-23 DIAGNOSIS — R1011 Right upper quadrant pain: Secondary | ICD-10-CM

## 2018-03-23 DIAGNOSIS — R1013 Epigastric pain: Secondary | ICD-10-CM | POA: Diagnosis not present

## 2018-03-23 DIAGNOSIS — K7689 Other specified diseases of liver: Secondary | ICD-10-CM | POA: Insufficient documentation

## 2018-03-23 NOTE — Telephone Encounter (Signed)
Patient was advised, she states that pain is still the same and it wakes her up from her sleep especially at night in pain. Please advise. KW

## 2018-03-23 NOTE — Telephone Encounter (Signed)
-----   Message from Carmon Ginsberg, [place] sent at 03/23/2018 12:07 PM EDT ----- Ultrasound ok. Are you feeling better?

## 2018-03-23 NOTE — Telephone Encounter (Signed)
Will be happy to send her to [facility].-who does she prefer to see?

## 2018-03-24 NOTE — Telephone Encounter (Signed)
Patient was advised. Patient stated she is feeling somewhat better and wants to hold off on GI referral for now.

## 2018-03-24 NOTE — Telephone Encounter (Signed)
[id number] 

## 2018-03-26 ENCOUNTER — Telehealth: Payer: Self-pay | Admitting: Family Medicine

## 2018-03-26 ENCOUNTER — Other Ambulatory Visit: Payer: Self-pay | Admitting: Family Medicine

## 2018-03-26 DIAGNOSIS — R1011 Right upper quadrant pain: Secondary | ICD-10-CM

## 2018-03-26 NOTE — Telephone Encounter (Signed)
Let her know the scan has been ordered. It tells how well her gallbladder is functioning.

## 2018-03-26 NOTE — Telephone Encounter (Signed)
Please advise. KW 

## 2018-03-26 NOTE — Telephone Encounter (Signed)
Patient advised.KW 

## 2018-03-26 NOTE — Telephone Encounter (Signed)
Pt seen you in august and was ordered an ultrasound.   She is a Therapist, sports at [employer] and talked to Dr. Marlyn Corporal today regarding her symptoms, etc.  He said to have her PCP order a "hida scan" because she is being woken up every night with abdominal pain and is on Protonix.

## 2018-04-12 ENCOUNTER — Ambulatory Visit: Payer: Self-pay

## 2018-04-13 ENCOUNTER — Telehealth: Payer: Self-pay

## 2018-04-13 ENCOUNTER — Encounter
Admission: RE | Admit: 2018-04-13 | Discharge: 2018-04-13 | Disposition: A | Discharge status: Home / Self Care | Payer: Managed Care, Other (non HMO) | Source: Ambulatory Visit | Attending: Family Medicine | Admitting: Family Medicine

## 2018-04-13 DIAGNOSIS — R1011 Right upper quadrant pain: Secondary | ICD-10-CM | POA: Insufficient documentation

## 2018-04-13 MED ORDER — TECHNETIUM TC 99M MEBROFENIN IV KIT
5.0000 | PACK | Freq: Once | INTRAVENOUS | Status: AC | PRN
  Administered 2018-04-13: 5.16 via INTRAVENOUS

## 2018-04-13 NOTE — Telephone Encounter (Signed)
Patient was advised she states that she is going out of town for a month and will think about it once she returns. KW

## 2018-04-13 NOTE — Telephone Encounter (Signed)
-----   Message from Carmon Ginsberg, [place] sent at 04/13/2018 11:21 AM EDT ----- Normal study. If pain is still present do you wish further referral to G.I.?

## 2018-05-01 ENCOUNTER — Other Ambulatory Visit: Payer: Self-pay | Admitting: Physician Assistant

## 2018-05-01 DIAGNOSIS — K219 Gastro-esophageal reflux disease without esophagitis: Secondary | ICD-10-CM

## 2018-05-03 ENCOUNTER — Telehealth: Payer: Self-pay | Admitting: Physician Assistant

## 2018-05-03 DIAGNOSIS — K219 Gastro-esophageal reflux disease without esophagitis: Secondary | ICD-10-CM

## 2018-05-03 DIAGNOSIS — R1011 Right upper quadrant pain: Secondary | ICD-10-CM

## 2018-05-03 DIAGNOSIS — R1013 Epigastric pain: Secondary | ICD-10-CM

## 2018-05-03 NOTE — Telephone Encounter (Signed)
Referral placed.

## 2018-05-03 NOTE — Telephone Encounter (Signed)
Please review

## 2018-05-03 NOTE — Telephone Encounter (Signed)
Pt called saying she wants to be referred top Dr. Wilhemena Durie at [facility]  She has been having stomach pain, indigestion and seen Mikki Santee last month and he ruled out that it was her gallbladder.  CB# [phone]  thanks  teri

## 2018-05-18 ENCOUNTER — Ambulatory Visit
Admission: RE | Admit: 2018-05-18 | Discharge: 2018-05-18 | Disposition: A | Discharge status: Home / Self Care | Payer: Managed Care, Other (non HMO) | Source: Ambulatory Visit | Attending: Physician Assistant | Admitting: Physician Assistant

## 2018-05-18 DIAGNOSIS — Z1231 Encounter for screening mammogram for malignant neoplasm of breast: Secondary | ICD-10-CM | POA: Insufficient documentation

## 2018-06-08 ENCOUNTER — Other Ambulatory Visit: Payer: Self-pay | Admitting: Physician Assistant

## 2018-06-08 DIAGNOSIS — I10 Essential (primary) hypertension: Secondary | ICD-10-CM

## 2018-06-15 ENCOUNTER — Encounter: Payer: Self-pay | Admitting: Gastroenterology

## 2018-06-15 ENCOUNTER — Ambulatory Visit (INDEPENDENT_AMBULATORY_CARE_PROVIDER_SITE_OTHER): Payer: Managed Care, Other (non HMO) | Admitting: Gastroenterology

## 2018-06-15 ENCOUNTER — Other Ambulatory Visit: Payer: Self-pay

## 2018-06-15 VITALS — BP 130/75 | HR 101 | Ht 62.0 in | Wt 155.4 lb

## 2018-06-15 DIAGNOSIS — R1013 Epigastric pain: Secondary | ICD-10-CM

## 2018-06-15 DIAGNOSIS — K581 Irritable bowel syndrome with constipation: Secondary | ICD-10-CM | POA: Diagnosis not present

## 2018-06-15 DIAGNOSIS — D3A8 Other benign neuroendocrine tumors: Secondary | ICD-10-CM

## 2018-06-15 NOTE — Progress Notes (Signed)
[name] MD, MRCP(U.K) [address]  [place]  [place], [place] [address]  Main: [phone]  Fax: [phone]   Gastroenterology Consultation  Referring Provider:     Florian Buff* Primary Care Physician:  Mar Daring, PA-C Primary Gastroenterologist:  Dr. [name]  Reason for Consultation:     Abdominal pain        HPI:   Jordan Baker is a 57 y.o. y/o female referred for consultation & management  by Dr. Marlyn Corporal, Clearnce Sorrel, PA-C.    She has been referred  to see me for acid reflux, epigastric pain, right upper quadrant pain.Marland Kitchen  Her records and note that she was previously been seen by Dr. Vira Agar and had a history of a neuroendocrine tumors which was incidentally found when she underwent her screening colonoscopy.  A 1 cm sized lesion was resected and there was some concern that the margins were not clear.  Looking back at the records the tumor was in the rectum and she underwent EMR in 2013.  Subsequently had rectal EUS in 2016 that was normal and at that point of time was recommended to have a repeat endoscopy in a years time.  I do not see any subsequent follow-up.  Imaging 03/23/2018 ultrasound abdomen: Small cysts are noted in the liver which was also noted previously in 2012 04/13/2018: HIDA scan: Normal gallbladder ejection fraction patient experienced mild abdominal pain while drinking Ensure.   03/12/2018: H. pylori breath test: Negative 03/12/2018: Lipase, CMP, CBC, normal  Abdominal pain: Onset: Began for 6-8 months, was getting worse and most of the time was at night , every day then she was started on protinix wwhich made it worse, chnaged to omeprazole which helped   Site :Epigastrium, felt like a hot fash,burning  Radiation: localized  Severity :4/10 Nature of pain: burning sensation  Aggravating factors: nothing  Relieving factors :nothing  Weight loss: no  NSAID use: takes ibuprofen once a week  PPI use :yes  Gall bladder  surgery: no  Frequency of bowel movements: every 2-4 days, at times very hard : presently has been going once a day  Change in bowel movements: baseline  Relief with bowel movements: yes  Gas/Bloating/Abdominal distension: yes , more gas , worse when she has not had a bowel movement :    Works in [employer] area.      Past Medical History:  Diagnosis Date  . Anxiety   . GERD (gastroesophageal reflux disease)   . Hyperlipidemia     Past Surgical History:  Procedure Laterality Date  . BIOPSY RECTAL    . CESAREAN SECTION  1990/1993   x2  . EUS N/A 05/17/2015   Procedure: LOWER ENDOSCOPIC ULTRASOUND (EUS);  Surgeon: Holly Bodily, MD;  Location: [facility];  Service: Gastroenterology;  Laterality: N/A;  . OVARIAN CYST SURGERY  1993  . partial knee replacemt Left 06/24/2017    Prior to Admission medications   Medication Sig Start Date End Date Taking? Authorizing Provider  ALPRAZolam Duanne Moron) 0.5 MG tablet Take 1 tablet (0.5 mg total) by mouth at bedtime as needed for anxiety. 01/15/18   Mar Daring, PA-C  amitriptyline (ELAVIL) 25 MG tablet Take 1 tablet (25 mg total) by mouth at bedtime. 06/09/17   Mar Daring, PA-C  amLODipine (NORVASC) 5 MG tablet TAKE 1 TABLET BY MOUTH ONCE DAILY 06/08/18   Burnette, Clearnce Sorrel, PA-C  AVIANE 0.1-20 MG-MCG tablet TAKE ONE TABLET BY MOUTH ONCE DAILY  08/13/17   Mar Daring, PA-C  cetirizine (ZYRTEC) 10 MG tablet Take by mouth.    [provider]  cyclobenzaprine (FLEXERIL) 10 MG tablet TAKE 1 TABLET BY MOUTH THREE TIMES DAILY AS NEEDED FOR MUSCLE SPASM 03/09/18   Mar Daring, PA-C  doxycycline (VIBRA-TABS) 100 MG tablet Take 1 tablet (100 mg total) by mouth daily. As needed 01/18/18   Mar Daring, PA-C  fluticasone [facility]) 50 MCG/ACT nasal spray USE 2 SPRAY(S) IN EACH NOSTRIL ONCE DAILY 12/01/17   Mar Daring, PA-C  levothyroxine (SYNTHROID, LEVOTHROID) 125 MCG tablet TAKE 1 TABLET BY  MOUTH ONCE DAILY 01/28/18   Mar Daring, PA-C  NON FORMULARY CPAP (Device) nightly    [provider]  pantoprazole (PROTONIX) 40 MG tablet TAKE 1 TABLET BY MOUTH ONCE DAILY 05/03/18   Mar Daring, PA-C  pravastatin (PRAVACHOL) 80 MG tablet Take 0.5 tablets (40 mg total) by mouth daily. 06/09/17   Mar Daring, PA-C    Family History  Problem Relation Age of Onset  . Breast cancer Paternal Grandmother   . Cancer Paternal Grandmother        Breast Cancer  . Hyperlipidemia Mother   . Hypertension Mother   . Hypertension Sister   . Arthritis Maternal Grandfather      Social History   Tobacco Use  . Smoking status: Never Smoker  . Smokeless tobacco: Never Used  Substance Use Topics  . Alcohol use: Yes    Alcohol/week: 0.0 standard drinks    Comment: Occasional  . Drug use: No    Allergies as of 06/15/2018 - Review Complete 06/15/2018  Allergen Reaction Noted  . Nitrofurantoin monohyd macro Rash 05/15/2015    Review of Systems:    All systems reviewed and negative except where noted in HPI.   Physical Exam:  BP 130/75   Pulse (!) 101   Ht 5\' 2"  (1.575 m)   Wt 155 lb 6.4 oz (70.5 kg)   BMI 28.42 kg/m  No LMP recorded. (Menstrual status: Irregular Periods). Psych:  Alert and cooperative. Normal mood and affect. General:   Alert,  Well-developed, well-nourished, pleasant and cooperative in NAD Head:  Normocephalic and atraumatic. Eyes:  Sclera clear, no icterus.   Conjunctiva pink. Ears:  Normal auditory acuity. Nose:  No deformity, discharge, or lesions. Mouth:  No deformity or lesions,oropharynx pink & moist. Neck:  Supple; no masses or thyromegaly. Lungs:  Respirations even and unlabored.  Clear throughout to auscultation.   No wheezes, crackles, or rhonchi. No acute distress. Heart:  Regular rate and rhythm; no murmurs, clicks, rubs, or gallops. Abdomen:  Normal bowel sounds.  No bruits.  Soft, non-tender and non-distended without  masses, hepatosplenomegaly or hernias noted.  No guarding or rebound tenderness.    Msk:  Symmetrical without gross deformities. Good, equal movement & strength bilaterally. Pulses:  Normal pulses noted. Extremities:  No clubbing or edema.  No cyanosis. Neurologic:  Alert and oriented x3;  grossly normal neurologically. Skin:  Intact without significant lesions or rashes. No jaundice. Lymph Nodes:  No significant cervical adenopathy. Psych:  Alert and cooperative. Normal mood and affect.  Imaging Studies: Mm 3d Screen Breast Bilateral  Result Date: 05/19/2018 CLINICAL DATA:  Screening. EXAM: DIGITAL SCREENING BILATERAL MAMMOGRAM WITH TOMO AND CAD COMPARISON:  Previous exam(s). ACR Breast Density Category b: There are scattered areas of fibroglandular density. FINDINGS: There are no findings suspicious for malignancy. Images were processed with CAD. IMPRESSION: No mammographic evidence of  malignancy. A result letter of this screening mammogram will be mailed directly to the patient. RECOMMENDATION: Screening mammogram in one year. (Code:SM-B-01Y) BI-RADS CATEGORY  1: Negative. Electronically Signed   By: Lillia Mountain M.D.   On: 05/19/2018 10:41    Assessment and Plan:   Jordan Baker is a 57 y.o. y/o female has been referred for  abdominal pain.  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 Her description of her pain could be related to either dyspepsia or IBS-C.   Plan 1.  EGD plus colonoscopy next Friday and if negative an still has pain despite intervention for IBS-C then will need CT abdomen 2. Linzess 72 mcg - she will call me in a week if no better and if not then we double the dose- samples provided 3. High fiber diet - patient information provided  I have discussed alternative options, risks & benefits,  which include, but are not limited to, bleeding, infection, perforation,respiratory complication & drug  reaction.  The patient agrees with this plan & written consent will be obtained.      Follow up in 6 weeks   Dr [name] MD,MRCP(U.K)

## 2018-06-15 NOTE — Patient Instructions (Signed)
High-Fiber Diet  Fiber, also called dietary fiber, is a type of carbohydrate found in fruits, vegetables, whole grains, and beans. A high-fiber diet can have many health benefits. Your health care provider may recommend a high-fiber diet to help:  · Prevent constipation. Fiber can make your bowel movements more regular.  · Lower your cholesterol.  · Relieve hemorrhoids, uncomplicated diverticulosis, or irritable bowel syndrome.  · Prevent overeating as part of a weight-loss plan.  · Prevent heart disease, type 2 diabetes, and certain cancers.    What is my plan?  The recommended daily intake of fiber includes:  · 38 grams for men under age 50.  · 30 grams for men over age 50.  · 25 grams for women under age 50.  · 21 grams for women over age 50.    You can get the recommended daily intake of dietary fiber by eating a variety of fruits, vegetables, grains, and beans. Your health care provider may also recommend a fiber supplement if it is not possible to get enough fiber through your diet.  What do I need to know about a high-fiber diet?  · Fiber supplements have not been widely studied for their effectiveness, so it is better to get fiber through food sources.  · Always check the fiber content on the nutrition facts label of any prepackaged food. Look for foods that contain at least 5 grams of fiber per serving.  · Ask your dietitian if you have questions about specific foods that are related to your condition, especially if those foods are not listed in the following section.  · Increase your daily fiber consumption gradually. Increasing your intake of dietary fiber too quickly may cause bloating, cramping, or gas.  · Drink plenty of water. Water helps you to digest fiber.  What foods can I eat?  Grains  Whole-grain breads. Multigrain cereal. Oats and oatmeal. Brown rice. Barley. Bulgur wheat. Millet. Bran muffins. Popcorn. Rye wafer crackers.  Vegetables   Sweet potatoes. Spinach. Kale. Artichokes. Cabbage. Broccoli. Green peas. Carrots. Squash.  Fruits  Berries. Pears. Apples. Oranges. Avocados. Prunes and raisins. Dried figs.  Meats and Other Protein Sources  Navy, kidney, pinto, and soy beans. Split peas. Lentils. Nuts and seeds.  Dairy  Fiber-fortified yogurt.  Beverages  Fiber-fortified soy milk. Fiber-fortified orange juice.  Other  Fiber bars.  The items listed above may not be a complete list of recommended foods or beverages. Contact your dietitian for more options.  What foods are not recommended?  Grains  White bread. Pasta made with refined flour. White rice.  Vegetables  Fried potatoes. Canned vegetables. Well-cooked vegetables.  Fruits  Fruit juice. Cooked, strained fruit.  Meats and Other Protein Sources  Fatty cuts of meat. Fried poultry or fried fish.  Dairy  Milk. Yogurt. Cream cheese. Sour cream.  Beverages  Soft drinks.  Other  Cakes and pastries. Butter and oils.  The items listed above may not be a complete list of foods and beverages to avoid. Contact your dietitian for more information.  What are some tips for including high-fiber foods in my diet?  · Eat a wide variety of high-fiber foods.  · Make sure that half of all grains consumed each day are whole grains.  · Replace breads and cereals made from refined flour or white flour with whole-grain breads and cereals.  · Replace white rice with brown rice, bulgur wheat, or millet.  · Start the day with a breakfast that is high in fiber,   such as a cereal that contains at least 5 grams of fiber per serving.  · Use beans in place of meat in soups, salads, or pasta.  · Eat high-fiber snacks, such as berries, raw vegetables, nuts, or popcorn.  This information is not intended to replace advice given to you by your health care provider. Make sure you discuss any questions you have with your health care provider.  Document Released: 06/30/2005 Document Revised: 12/06/2015 Document Reviewed: 12/13/2013   Elsevier Interactive Patient Education © 2018 [employer]

## 2018-06-16 ENCOUNTER — Encounter: Payer: Self-pay | Admitting: Physician Assistant

## 2018-06-16 ENCOUNTER — Ambulatory Visit (INDEPENDENT_AMBULATORY_CARE_PROVIDER_SITE_OTHER): Payer: Managed Care, Other (non HMO) | Admitting: Physician Assistant

## 2018-06-16 ENCOUNTER — Other Ambulatory Visit: Payer: Self-pay

## 2018-06-16 VITALS — BP 120/76 | HR 83 | Temp 98.3°F | Resp 16 | Ht 62.0 in | Wt 154.8 lb

## 2018-06-16 DIAGNOSIS — Z0000 Encounter for general adult medical examination without abnormal findings: Secondary | ICD-10-CM | POA: Diagnosis not present

## 2018-06-16 DIAGNOSIS — I208 Other forms of angina pectoris: Secondary | ICD-10-CM

## 2018-06-16 DIAGNOSIS — E7800 Pure hypercholesterolemia, unspecified: Secondary | ICD-10-CM | POA: Diagnosis not present

## 2018-06-16 DIAGNOSIS — I10 Essential (primary) hypertension: Secondary | ICD-10-CM | POA: Diagnosis not present

## 2018-06-16 DIAGNOSIS — E039 Hypothyroidism, unspecified: Secondary | ICD-10-CM | POA: Diagnosis not present

## 2018-06-16 DIAGNOSIS — Z6828 Body mass index (BMI) 28.0-28.9, adult: Secondary | ICD-10-CM

## 2018-06-16 MED ORDER — PRAVASTATIN SODIUM 80 MG PO TABS: 40.0000 mg | ORAL_TABLET | Freq: Every day | ORAL | 3 refills | Status: DC

## 2018-06-16 NOTE — Patient Instructions (Signed)

## 2018-06-16 NOTE — Progress Notes (Signed)
Patient: Jordan Baker, Female    DOB: August 27, 1960, 57 y.o.   MRN: [id number] Visit Date: 06/16/2018  Today's Provider: Mar Daring, PA-C   Chief Complaint  Patient presents with  . Annual Exam   Subjective:    Annual physical exam Jordan Baker is a 57 y.o. female who presents today for health maintenance and complete physical. She feels well. She reports exercising some. She reports she is sleeping poorly. Due to abdominal pain, seen by GI.  06/09/17 CPE 06/02/16 Pap/HPV-neg 05/21/18 Mammogram-BI-RADS 1 Colonoscopy and EGD scheduled for next Friday with Dr. Vicente Males. -----------------------------------------------------------------   Review of Systems  Constitutional: Negative.   HENT: Negative.   Eyes: Negative.   Respiratory: Negative.   Cardiovascular: Negative.   Gastrointestinal: Negative.   Endocrine: Negative.   Genitourinary: Negative.   Musculoskeletal: Negative.   Skin: Negative.   Neurological: Negative.   Hematological: Negative.   Psychiatric/Behavioral: Negative.     Social History      She  reports that she has never smoked. She has never used smokeless tobacco. She reports that she drinks alcohol. She reports that she does not use drugs.       Social History   Socioeconomic History  . Marital status: Married    Spouse name: Not on file  . Number of children: 2  . Years of education: Not on file  . Highest education level: Not on file  Occupational History    Employer: [employer]  Social Needs  . Financial resource strain: Not on file  . Food insecurity:    Worry: Not on file    Inability: Not on file  . Transportation needs:    Medical: Not on file    Non-medical: Not on file  Tobacco Use  . Smoking status: Never Smoker  . Smokeless tobacco: Never Used  Substance and Sexual Activity  . Alcohol use: Yes    Alcohol/week: 0.0 standard drinks    Comment: Occasional  . Drug use: No  . Sexual  activity: Not on file  Lifestyle  . Physical activity:    Days per week: Not on file    Minutes per session: Not on file  . Stress: Not on file  Relationships  . Social connections:    Talks on phone: Not on file    Gets together: Not on file    Attends religious service: Not on file    Active member of club or organization: Not on file    Attends meetings of clubs or organizations: Not on file    Relationship status: Not on file  Other Topics Concern  . Not on file  Social History Narrative  . Not on file    Past Medical History:  Diagnosis Date  . Anxiety   . GERD (gastroesophageal reflux disease)   . Hyperlipidemia      Patient Active Problem List   Diagnosis Date Noted  . S/P left unicompartmental knee replacement 07/02/2017  . Chronic Arthralgia of knees (Primary Area of Pain) (Bilateral) (L>R) 02/12/2017  . Osteoarthritis of knee (Bilateral) (L>R) 01/05/2017  . Chronic knee pain (Primary Area of Pain) (Bilateral) (L>R) 12/31/2016  . Incomplete emptying of bladder 06/22/2016  . Benign essential HTN 06/10/2016  . Stable angina pectoris ([place]) 06/10/2016  . Abnormal blood sugar 06/02/2016  . Fibroid 05/31/2015  . Hypercholesterolemia 05/31/2015  . Hot flash, menopausal 05/31/2015  . GERD (gastroesophageal reflux disease) 05/15/2015  . Hypothyroidism 05/15/2015  .  Anxiety disorder 05/15/2015  . Sleep apnea 05/15/2015  . Insomnia 05/15/2015  . History of neuroendocrine cancer 02/02/2013  . Bladder infection, chronic 08/10/2012  . Female genital symptoms 08/10/2012  . Increased frequency of urination 08/10/2012    Past Surgical History:  Procedure Laterality Date  . BIOPSY RECTAL    . CESAREAN SECTION  1990/1993   x2  . EUS N/A 05/17/2015   Procedure: LOWER ENDOSCOPIC ULTRASOUND (EUS);  Surgeon: Holly Bodily, MD;  Location: [facility] ENDOSCOPY;  Service: Gastroenterology;  Laterality: N/A;  . OVARIAN CYST SURGERY  1993  . partial knee replacemt Left  06/24/2017    Family History        Family Status  Relation Name Status  . PGM  Deceased at age 42       died from old age  . Mother  Alive  . Father  Deceased at age 27       Lung cancer  . Sister 1 Alive  . MGM  Deceased at age 61       died from old age  . MGF  Deceased at age 69's       died from congestiv heart failure.  Marland Kitchen PGF  Deceased at age 14       died from old age  . Sister 2 Alive  . Son  Alive  . Son  Alive        Her family history includes Arthritis in her maternal grandfather; Breast cancer in her paternal grandmother; Cancer in her paternal grandmother; Hyperlipidemia in her mother; Hypertension in her mother and sister.      Allergies  Allergen Reactions  . Nitrofurantoin Monohyd Macro Rash     Current Outpatient Medications:  .  ALPRAZolam (XANAX) 0.5 MG tablet, Take 1 tablet (0.5 mg total) by mouth at bedtime as needed for anxiety., Disp: 90 tablet, Rfl: 3 .  amitriptyline (ELAVIL) 25 MG tablet, Take 1 tablet (25 mg total) by mouth at bedtime., Disp: 90 tablet, Rfl: 3 .  amLODipine (NORVASC) 5 MG tablet, TAKE 1 TABLET BY MOUTH ONCE DAILY, Disp: 90 tablet, Rfl: 3 .  AVIANE 0.1-20 MG-MCG tablet, TAKE ONE TABLET BY MOUTH ONCE DAILY, Disp: 84 tablet, Rfl: 3 .  cetirizine (ZYRTEC) 10 MG tablet, Take by mouth., Disp: , Rfl:  .  fluticasone (FLONASE) 50 MCG/ACT nasal spray, USE 2 SPRAY(S) IN EACH NOSTRIL ONCE DAILY, Disp: 48 g, Rfl: 1 .  levothyroxine (SYNTHROID, LEVOTHROID) 125 MCG tablet, TAKE 1 TABLET BY MOUTH ONCE DAILY, Disp: 90 tablet, Rfl: 1 .  NON FORMULARY, CPAP (Device) nightly, Disp: , Rfl:  .  omeprazole (PRILOSEC) 20 MG capsule, Take 20 mg by mouth daily., Disp: , Rfl: 3 .  pravastatin (PRAVACHOL) 80 MG tablet, Take 0.5 tablets (40 mg total) by mouth daily., Disp: 90 tablet, Rfl: 3 .  cyclobenzaprine (FLEXERIL) 10 MG tablet, TAKE 1 TABLET BY MOUTH THREE TIMES DAILY AS NEEDED FOR MUSCLE SPASM (Patient not taking: Reported on 06/16/2018), Disp: 30  tablet, Rfl: 5 .  doxycycline (VIBRA-TABS) 100 MG tablet, Take 1 tablet (100 mg total) by mouth daily. As needed (Patient not taking: Reported on 06/16/2018), Disp: 30 tablet, Rfl: 1   Patient Care Team: Mar Daring, PA-C as PCP - General (Family Medicine)      Objective:   Vitals: BP 120/76 (BP Location: Left Arm, Patient Position: Sitting, Cuff Size: Normal)   Pulse 83   Temp 98.3 F (36.8 C) (Oral)   Resp 16  Ht 5\' 2"  (1.575 m)   Wt 154 lb 12.8 oz (70.2 kg)   SpO2 96%   BMI 28.31 kg/m    Vitals:   06/16/18 1557  BP: 120/76  Pulse: 83  Resp: 16  Temp: 98.3 F (36.8 C)  TempSrc: Oral  SpO2: 96%  Weight: 154 lb 12.8 oz (70.2 kg)  Height: 5\' 2"  (1.575 m)     Physical Exam  Constitutional: She is oriented to person, place, and time. She appears well-developed and well-nourished. No distress.  HENT:  Head: Normocephalic and atraumatic.  Right Ear: Hearing, tympanic membrane, external ear and ear canal normal.  Left Ear: Hearing, tympanic membrane, external ear and ear canal normal.  Nose: Nose normal.  Mouth/Throat: Uvula is midline, oropharynx is clear and moist and mucous membranes are normal. No oropharyngeal exudate.  Eyes: Pupils are equal, round, and reactive to light. Conjunctivae and EOM are normal. Right eye exhibits no discharge. Left eye exhibits no discharge. No scleral icterus.  Neck: Normal range of motion. Neck supple. No JVD present. Carotid bruit is not present. No tracheal deviation present. No thyromegaly present.  Cardiovascular: Normal rate, regular rhythm, normal heart sounds and intact distal pulses. Exam reveals no gallop and no friction rub.  No murmur heard. Pulmonary/Chest: Effort normal and breath sounds normal. No respiratory distress. She has no wheezes. She has no rales. She exhibits no tenderness.  Abdominal: Soft. Bowel sounds are normal. She exhibits no distension and no mass. There is no tenderness. There is no rebound and no  guarding.  Musculoskeletal: Normal range of motion. She exhibits no edema or tenderness.  Lymphadenopathy:    She has no cervical adenopathy.  Neurological: She is alert and oriented to person, place, and time.  Skin: Skin is warm and dry. No rash noted. She is not diaphoretic.  Psychiatric: She has a normal mood and affect. Her behavior is normal. Judgment and thought content normal.  Vitals reviewed.    Depression Screen PHQ 2/9 Scores 06/09/2017 02/12/2017 01/05/2017 06/02/2016  PHQ - 2 Score 0 0 0 0  PHQ- 9 Score 1 - - -      Assessment & Plan:     Routine Health Maintenance and Physical Exam  Exercise Activities and Dietary recommendations Goals   None     Immunization History  Administered Date(s) Administered  . Td 06/02/2016  . Tdap 10/21/2005    Health Maintenance  Topic Date Due  . INFLUENZA VACCINE  02/11/2018  . PAP SMEAR  06/03/2019  . MAMMOGRAM  05/18/2020  . COLONOSCOPY  12/16/2022  . TETANUS/TDAP  06/02/2026  . Hepatitis C Screening  Completed  . HIV Screening  Completed     Discussed health benefits of physical activity, and encouraged her to engage in regular exercise appropriate for her age and condition.    1. Annual physical exam Normal physical exam today. Will check labs as below and f/u pending lab results. If labs are stable and WNL she will not need to have these rechecked for one year at her next annual physical exam. She is to call the office in the meantime if she has any acute issue, questions or concerns.  2. Hypercholesterolemia Stable. Diagnosis pulled for medication refill. Continue current medical treatment plan. Will check labs as below and f/u pending results. - pravastatin (PRAVACHOL) 80 MG tablet; Take 0.5 tablets (40 mg total) by mouth daily.  Dispense: 90 tablet; Refill: 3 - CBC with Differential - Comprehensive Metabolic Panel (CMET) - Lipid  Profile  3. Hypothyroidism, unspecified type Stable on levothyroxine 132mcg.  Will check labs as below and f/u pending results. - TSH  4. Benign essential HTN Stable. Continue amlodipine 5mg  daily. Will check labs as below and f/u pending results. - CBC with Differential - Comprehensive Metabolic Panel (CMET)  5. Stable angina pectoris ([facility]) Stable. Followed by Dr. Nehemiah Massed. Will check labs as below and f/u pending results. - CBC with Differential - Comprehensive Metabolic Panel (CMET) - Lipid Profile  6. BMI 28.0-28.9,adult Counseled patient on healthy lifestyle modifications including dieting and exercise.  - HgB A1c  --------------------------------------------------------------------    Mar Daring, PA-C  [facility]

## 2018-06-17 ENCOUNTER — Telehealth: Payer: Self-pay

## 2018-06-17 NOTE — Telephone Encounter (Signed)
Patient called to inform CMA that she had her flu vaccine at [facility] on 03/22/18. KW

## 2018-06-18 ENCOUNTER — Telehealth: Payer: Self-pay

## 2018-06-18 ENCOUNTER — Telehealth: Payer: Self-pay | Admitting: Gastroenterology

## 2018-06-18 LAB — COMPREHENSIVE METABOLIC PANEL
ALK PHOS: 62 IU/L (ref 39–117)
ALT: 10 IU/L (ref 0–32)
AST: 13 IU/L (ref 0–40)
Albumin/Globulin Ratio: 1.6 (ref 1.2–2.2)
Albumin: 4 g/dL (ref 3.5–5.5)
BILIRUBIN TOTAL: 0.2 mg/dL (ref 0.0–1.2)
BUN/Creatinine Ratio: 15 (ref 9–23)
BUN: 12 mg/dL (ref 6–24)
CO2: 21 mmol/L (ref 20–29)
CREATININE: 0.8 mg/dL (ref 0.57–1.00)
Calcium: 9.3 mg/dL (ref 8.7–10.2)
Chloride: 101 mmol/L (ref 96–106)
GFR calc Af Amer: 95 mL/min/{1.73_m2} (ref >59)
GFR calc non Af Amer: 83 mL/min/{1.73_m2} (ref >59)
GLUCOSE: 82 mg/dL (ref 65–99)
Globulin, Total: 2.5 g/dL (ref 1.5–4.5)
Potassium: 4.2 mmol/L (ref 3.5–5.2)
Sodium: 137 mmol/L (ref 134–144)
Total Protein: 6.5 g/dL (ref 6.0–8.5)

## 2018-06-18 LAB — LIPID PANEL
Chol/HDL Ratio: 4.3 ratio (ref 0.0–4.4)
Cholesterol, Total: 200 mg/dL — ABNORMAL HIGH (ref 100–199)
HDL: 46 mg/dL (ref >39)
LDL Calculated: 127 mg/dL — ABNORMAL HIGH (ref 0–99)
Triglycerides: 136 mg/dL (ref 0–149)
VLDL Cholesterol Cal: 27 mg/dL (ref 5–40)

## 2018-06-18 LAB — TSH: TSH: 2.18 u[IU]/mL (ref 0.450–4.500)

## 2018-06-18 LAB — CBC WITH DIFFERENTIAL/PLATELET
BASOS ABS: 0 10*3/uL (ref 0.0–0.2)
Basos: 1 %
EOS (ABSOLUTE): 0.2 10*3/uL (ref 0.0–0.4)
Eos: 3 %
HEMOGLOBIN: 12.5 g/dL (ref 11.1–15.9)
Hematocrit: 37.3 % (ref 34.0–46.6)
Immature Grans (Abs): 0 10*3/uL (ref 0.0–0.1)
Immature Granulocytes: 0 %
LYMPHS ABS: 2.1 10*3/uL (ref 0.7–3.1)
Lymphs: 31 %
MCH: 29.1 pg (ref 26.6–33.0)
MCHC: 33.5 g/dL (ref 31.5–35.7)
MCV: 87 fL (ref 79–97)
Monocytes Absolute: 0.4 10*3/uL (ref 0.1–0.9)
Monocytes: 6 %
NEUTROS ABS: 4.1 10*3/uL (ref 1.4–7.0)
Neutrophils: 59 %
Platelets: 359 10*3/uL (ref 150–450)
RBC: 4.3 x10E6/uL (ref 3.77–5.28)
RDW: 12.8 % (ref 12.3–15.4)
WBC: 6.9 10*3/uL (ref 3.4–10.8)

## 2018-06-18 LAB — HEMOGLOBIN A1C
Est. average glucose Bld gHb Est-mCnc: 114 mg/dL
Hgb A1c MFr Bld: 5.6 % (ref 4.8–5.6)

## 2018-06-18 NOTE — Telephone Encounter (Signed)
Patient has been advised. KW 

## 2018-06-18 NOTE — Telephone Encounter (Signed)
-----   Message from Mar Daring, PA-C sent at 06/18/2018  7:58 AM EST ----- All labs are within normal limits and stable.  Thanks! -JB

## 2018-06-18 NOTE — Telephone Encounter (Signed)
Pt left vm she states Dr. Vicente Males put her on rx Linsetts and she was supposed to let him know how she was doing on it. She states she is still not having many bowel movements she was able to sleep.  She was not sure if Dr. Vicente Males wanted her to take 2 pills a day or not please call pt

## 2018-06-21 ENCOUNTER — Ambulatory Visit: Payer: Self-pay | Admitting: Gastroenterology

## 2018-06-22 ENCOUNTER — Encounter

## 2018-06-22 ENCOUNTER — Ambulatory Visit: Payer: Self-pay | Admitting: Gastroenterology

## 2018-06-22 NOTE — Telephone Encounter (Signed)
Spoke with pt regarding Linzess and symptoms. Pt states she hasn't noticed any improvement since beginning Linzess 76mcg I explained Dr. Vicente Males suggested doubling the dose of Linzess if the 79mcg did not improve constipation. Pt states she began taking the double dose yesterday and plans to give Dr. Vicente Males an update on the day of her endoscopy procedure.

## 2018-06-25 ENCOUNTER — Ambulatory Visit: Payer: Managed Care, Other (non HMO) | Admitting: Anesthesiology

## 2018-06-25 ENCOUNTER — Other Ambulatory Visit: Payer: Self-pay

## 2018-06-25 ENCOUNTER — Encounter
Admission: RE | Disposition: A | Discharge status: Home / Self Care | Payer: Self-pay | Source: Ambulatory Visit | Attending: Gastroenterology

## 2018-06-25 ENCOUNTER — Ambulatory Visit
Admission: RE | Admit: 2018-06-25 | Discharge: 2018-06-25 | Disposition: A | Discharge status: Home / Self Care | Payer: Managed Care, Other (non HMO) | Source: Ambulatory Visit | Attending: Gastroenterology | Admitting: Gastroenterology

## 2018-06-25 DIAGNOSIS — D3A8 Other benign neuroendocrine tumors: Secondary | ICD-10-CM

## 2018-06-25 DIAGNOSIS — E785 Hyperlipidemia, unspecified: Secondary | ICD-10-CM | POA: Diagnosis not present

## 2018-06-25 DIAGNOSIS — Z793 Long term (current) use of hormonal contraceptives: Secondary | ICD-10-CM | POA: Insufficient documentation

## 2018-06-25 DIAGNOSIS — R109 Unspecified abdominal pain: Secondary | ICD-10-CM | POA: Diagnosis present

## 2018-06-25 DIAGNOSIS — K642 Third degree hemorrhoids: Secondary | ICD-10-CM | POA: Diagnosis not present

## 2018-06-25 DIAGNOSIS — K219 Gastro-esophageal reflux disease without esophagitis: Secondary | ICD-10-CM | POA: Insufficient documentation

## 2018-06-25 DIAGNOSIS — K581 Irritable bowel syndrome with constipation: Secondary | ICD-10-CM

## 2018-06-25 DIAGNOSIS — E039 Hypothyroidism, unspecified: Secondary | ICD-10-CM | POA: Insufficient documentation

## 2018-06-25 DIAGNOSIS — K319 Disease of stomach and duodenum, unspecified: Secondary | ICD-10-CM | POA: Diagnosis not present

## 2018-06-25 DIAGNOSIS — K317 Polyp of stomach and duodenum: Secondary | ICD-10-CM | POA: Diagnosis not present

## 2018-06-25 DIAGNOSIS — K644 Residual hemorrhoidal skin tags: Secondary | ICD-10-CM | POA: Diagnosis not present

## 2018-06-25 DIAGNOSIS — R1013 Epigastric pain: Secondary | ICD-10-CM

## 2018-06-25 DIAGNOSIS — Z888 Allergy status to other drugs, medicaments and biological substances status: Secondary | ICD-10-CM | POA: Diagnosis not present

## 2018-06-25 DIAGNOSIS — F419 Anxiety disorder, unspecified: Secondary | ICD-10-CM | POA: Insufficient documentation

## 2018-06-25 DIAGNOSIS — Z79899 Other long term (current) drug therapy: Secondary | ICD-10-CM | POA: Diagnosis not present

## 2018-06-25 HISTORY — PX: COLONOSCOPY WITH PROPOFOL: SHX5780

## 2018-06-25 HISTORY — PX: ESOPHAGOGASTRODUODENOSCOPY (EGD) WITH PROPOFOL: SHX5813

## 2018-06-25 HISTORY — DX: Hypothyroidism, unspecified: E03.9

## 2018-06-25 SURGERY — COLONOSCOPY WITH PROPOFOL
Anesthesia: General

## 2018-06-25 MED ORDER — GLYCOPYRROLATE PF 0.2 MG/ML IJ SOSY
PREFILLED_SYRINGE | INTRAMUSCULAR | Status: DC | PRN
  Administered 2018-06-25: .2 mg via INTRAVENOUS

## 2018-06-25 MED ORDER — LIDOCAINE HCL (PF) 2 % IJ SOLN
INTRAMUSCULAR | Status: AC
  Filled 2018-06-25: qty 10

## 2018-06-25 MED ORDER — GLYCOPYRROLATE 0.2 MG/ML IJ SOLN
INTRAMUSCULAR | Status: AC
  Filled 2018-06-25: qty 1

## 2018-06-25 MED ORDER — SODIUM CHLORIDE 0.9 % IV SOLN
INTRAVENOUS | Status: DC
  Administered 2018-06-25: 09:00:00 via INTRAVENOUS

## 2018-06-25 MED ORDER — PROPOFOL 500 MG/50ML IV EMUL
INTRAVENOUS | Status: DC | PRN
  Administered 2018-06-25: 160 ug/kg/min via INTRAVENOUS

## 2018-06-25 MED ORDER — LIDOCAINE HCL (CARDIAC) PF 100 MG/5ML IV SOSY
PREFILLED_SYRINGE | INTRAVENOUS | Status: DC | PRN
  Administered 2018-06-25: 70 mg via INTRAVENOUS

## 2018-06-25 MED ORDER — PROPOFOL 10 MG/ML IV BOLUS
INTRAVENOUS | Status: DC | PRN
  Administered 2018-06-25: 80 mg via INTRAVENOUS
  Administered 2018-06-25: 20 mg via INTRAVENOUS

## 2018-06-25 NOTE — Transfer of Care (Signed)
Immediate Anesthesia Transfer of Care Note  Patient: Jordan Baker  Procedure(s) Performed: COLONOSCOPY WITH PROPOFOL (N/A ) ESOPHAGOGASTRODUODENOSCOPY (EGD) WITH PROPOFOL (N/A )  Patient Location: Endoscopy Unit  Anesthesia Type:General  Level of Consciousness: awake  Airway & Oxygen Therapy: Patient Spontanous Breathing and Patient connected to nasal cannula oxygen  Post-op Assessment: Report given to RN and Post -op Vital signs reviewed and stable  Post vital signs: Reviewed and stable  Last Vitals:  Vitals Value Taken Time  BP    Temp    Pulse    Resp    SpO2      Last Pain:  Vitals:   06/25/18 0848  TempSrc: Oral  PainSc: 2          Complications: No apparent anesthesia complications

## 2018-06-25 NOTE — Anesthesia Post-op Follow-up Note (Signed)
Anesthesia QCDR form completed.        

## 2018-06-25 NOTE — Anesthesia Preprocedure Evaluation (Signed)
Anesthesia Evaluation  Patient identified by MRN, date of birth, ID band Patient awake    Reviewed: Allergy & Precautions, H&P , NPO status , Patient's Chart, lab work & pertinent test results  History of Anesthesia Complications Negative for: history of anesthetic complications  Airway Mallampati: III  TM Distance: <3 FB Neck ROM: full    Dental  (+) Chipped   Pulmonary neg pulmonary ROS, neg shortness of breath, sleep apnea and Continuous Positive Airway Pressure Ventilation ,           Cardiovascular Exercise Tolerance: Good hypertension, (-) angina(-) Past MI and (-) DOE      Neuro/Psych PSYCHIATRIC DISORDERS negative neurological ROS     GI/Hepatic Neg liver ROS, GERD  Controlled,  Endo/Other  Hypothyroidism   Renal/GU negative Renal ROS  negative genitourinary   Musculoskeletal   Abdominal   Peds  Hematology negative hematology ROS (+)   Anesthesia Other Findings Past Medical History: No date: Anxiety No date: GERD (gastroesophageal reflux disease) No date: Hyperlipidemia No date: Hypothyroidism  Past Surgical History: No date: BIOPSY RECTAL 1990/1993: CESAREAN SECTION     Comment:  x2 05/17/2015: EUS; N/A     Comment:  Procedure: LOWER ENDOSCOPIC ULTRASOUND (EUS);  Surgeon:               Holly Bodily, MD;  Location: [facility] ENDOSCOPY;                Service: Gastroenterology;  Laterality: N/A; 06/2017: JOINT REPLACEMENT; Left 1993: OVARIAN CYST SURGERY 06/24/2017: partial knee replacemt; Left  BMI    Body Mass Index:  28.17 kg/m      Reproductive/Obstetrics negative OB ROS                             Anesthesia Physical Anesthesia Plan  ASA: III  Anesthesia Plan: General   Post-op Pain Management:    Induction: Intravenous  PONV Risk Score and Plan: Propofol infusion and TIVA  Airway Management Planned: Natural Airway and Nasal Cannula  Additional  Equipment:   Intra-op Plan:   Post-operative Plan:   Informed Consent: I have reviewed the patients History and Physical, chart, labs and discussed the procedure including the risks, benefits and alternatives for the proposed anesthesia with the patient or authorized representative who has indicated his/her understanding and acceptance.   Dental Advisory Given  Plan Discussed with: Anesthesiologist, CRNA and Surgeon  Anesthesia Plan Comments: (Patient consented for risks of anesthesia including but not limited to:  - adverse reactions to medications - risk of intubation if required - damage to teeth, lips or other oral mucosa - sore throat or hoarseness - Damage to heart, brain, lungs or loss of life  Patient voiced understanding.)        Anesthesia Quick Evaluation

## 2018-06-25 NOTE — Op Note (Signed)
[facility] Gastroenterology Patient Name: Jordan Baker Procedure Date: 06/25/2018 9:14 AM MRN: [id number] Account #: [id number] Date of Birth: 12/19/1960 Admit Type: Outpatient Age: 57 Room: [facility] ENDO ROOM 4 Gender: Female Note Status: Finalized Procedure:            Upper GI endoscopy Indications:          Abdominal pain Providers:            [name] MD, MD Referring MD:         Robert Bellow, MD (Referring MD) Medicines:            Monitored Anesthesia Care Complications:        No immediate complications. Procedure:            Pre-Anesthesia Assessment:                       - Prior to the procedure, a History and Physical was                        performed, and patient medications, allergies and                        sensitivities were reviewed. The patient's tolerance of                        previous anesthesia was reviewed.                       - The risks and benefits of the procedure and the                        sedation options and risks were discussed with the                        patient. All questions were answered and informed                        consent was obtained.                       - ASA Grade Assessment: II - A patient with mild                        systemic disease.                       After obtaining informed consent, the endoscope was                        passed under direct vision. Throughout the procedure,                        the patient's blood pressure, pulse, and oxygen                        saturations were monitored continuously. The Endoscope                        was introduced through the mouth, and advanced to the  third part of duodenum. The upper GI endoscopy was                        accomplished with ease. The patient tolerated the                        procedure well. Findings:      The examined duodenum was normal.      The esophagus was normal.      Eight 6 to  10 mm pedunculated and sessile polyps with no stigmata of       recent bleeding were found in the gastric body. The polyp was removed       with a hot snare. Resection was complete, but the polyp tissue was only       partially retrieved. 6/8 polyps were retrieved      The cardia and gastric fundus were normal on retroflexion.      Normal mucosa was found in the entire examined stomach. Biopsies were       taken with a cold forceps for histology. Impression:           - Normal examined duodenum.                       - Normal esophagus.                       - Eight gastric polyps. Complete resection. Partial                        retrieval. Recommendation:       - Await pathology results.                       - Perform a colonoscopy today. Procedure Code(s):    --- Professional ---                       [phone], Esophagogastroduodenoscopy, flexible, transoral;                        with removal of tumor(s), polyp(s), or other lesion(s)                        by snare technique                       [id number], 67, Esophagogastroduodenoscopy, flexible,                        transoral; with biopsy, single or multiple Diagnosis Code(s):    --- Professional ---                       K31.7, Polyp of stomach and duodenum                       R10.9, Unspecified abdominal pain CPT copyright 2018 American Medical Association. All rights reserved. The codes documented in this report are preliminary and upon coder review may  be revised to meet current compliance requirements. [name], MD [name] MD, MD 06/25/2018 9:39:18 AM This report has been signed electronically. Number of Addenda: 0 Note Initiated On: 06/25/2018 9:14 AM      [facility]

## 2018-06-25 NOTE — Op Note (Signed)
[facility] Gastroenterology Patient Name: Jordan Baker Procedure Date: 06/25/2018 9:12 AM MRN: [id number] Account #: [id number] Date of Birth: 1960/08/12 Admit Type: Outpatient Age: 57 Room: [facility] ENDO ROOM 4 Gender: Female Note Status: Finalized Procedure:            Colonoscopy Indications:          Abdominal pain Providers:            [name] MD, MD Referring MD:         Robert Bellow, MD (Referring MD) Medicines:            Monitored Anesthesia Care Complications:        No immediate complications. Procedure:            Pre-Anesthesia Assessment:                       - Prior to the procedure, a History and Physical was                        performed, and patient medications, allergies and                        sensitivities were reviewed. The patient's tolerance of                        previous anesthesia was reviewed.                       - The risks and benefits of the procedure and the                        sedation options and risks were discussed with the                        patient. All questions were answered and informed                        consent was obtained.                       - ASA Grade Assessment: II - A patient with mild                        systemic disease.                       After obtaining informed consent, the colonoscope was                        passed under direct vision. Throughout the procedure,                        the patient's blood pressure, pulse, and oxygen                        saturations were monitored continuously. The                        Colonoscope was introduced through the anus and                        advanced  to the the cecum, identified by the                        appendiceal orifice, IC valve and transillumination.                        The colonoscopy was performed with ease. The patient                        tolerated the procedure well. The quality of the bowel                   preparation was good. Findings:      The perianal and digital rectal examinations were normal.      Non-bleeding external and internal hemorrhoids were found during       retroflexion and during anoscopy. The hemorrhoids were large and Grade       III (internal hemorrhoids that prolapse but require manual reduction).      A 5 mm polyp was found in the ascending colon. The polyp was sessile.       The polyp was removed with a cold snare. Resection and retrieval were       complete. Impression:           - Non-bleeding external and internal hemorrhoids.                       - One 5 mm polyp in the ascending colon, removed with a                        cold snare. Resected and retrieved. Recommendation:       - Await pathology results.                       - Discharge patient to home (with escort).                       - Resume previous diet.                       - Continue present medications.                       - Repeat colonoscopy in 5 years for surveillance.                       - Return to my office as previously scheduled. Procedure Code(s):    --- Professional ---                       [phone], Colonoscopy, flexible; with removal of tumor(s),                        polyp(s), or other lesion(s) by snare technique Diagnosis Code(s):    --- Professional ---                       D12.2, Benign neoplasm of ascending colon                       K64.2, Third degree hemorrhoids  R10.9, Unspecified abdominal pain CPT copyright 2018 American Medical Association. All rights reserved. The codes documented in this report are preliminary and upon coder review may  be revised to meet current compliance requirements. [name], MD [name] MD, MD 06/25/2018 9:57:24 AM This report has been signed electronically. Number of Addenda: 0 Note Initiated On: 06/25/2018 9:12 AM Scope Withdrawal Time: 0 hours 10 minutes 48 seconds  Total Procedure Duration:  0 hours 13 minutes 21 seconds       [facility]

## 2018-06-25 NOTE — H&P (Signed)
[name], MD [address], [place], [place], [place], [address] [address], [place], [place], [place], [address] Phone: [phone]  Fax: [phone]  Primary Care Physician:  Mar Daring, PA-C   Pre-Procedure History & Physical: HPI:  Jordan Baker is a 57 y.o. female is here for an endoscopy and colonoscopy    Past Medical History:  Diagnosis Date  . Anxiety   . GERD (gastroesophageal reflux disease)   . Hyperlipidemia   . Hypothyroidism     Past Surgical History:  Procedure Laterality Date  . BIOPSY RECTAL    . CESAREAN SECTION  1990/1993   x2  . EUS N/A 05/17/2015   Procedure: LOWER ENDOSCOPIC ULTRASOUND (EUS);  Surgeon: Holly Bodily, MD;  Location: [facility] ENDOSCOPY;  Service: Gastroenterology;  Laterality: N/A;  . JOINT REPLACEMENT Left 06/2017  . OVARIAN CYST SURGERY  1993  . partial knee replacemt Left 06/24/2017    Prior to Admission medications   Medication Sig Start Date End Date Taking? Authorizing Provider  ALPRAZolam Duanne Moron) 0.5 MG tablet Take 1 tablet (0.5 mg total) by mouth at bedtime as needed for anxiety. 01/15/18  Yes Mar Daring, PA-C  amitriptyline (ELAVIL) 25 MG tablet Take 1 tablet (25 mg total) by mouth at bedtime. Patient taking differently: Take 25 mg by mouth at bedtime. Pt reports she takes 0.5 tablets at hs 06/09/17  Yes Mar Daring, PA-C  amLODipine (NORVASC) 5 MG tablet TAKE 1 TABLET BY MOUTH ONCE DAILY 06/08/18  Yes Mar Daring, PA-C  AVIANE 0.1-20 MG-MCG tablet TAKE ONE TABLET BY MOUTH ONCE DAILY 08/13/17  Yes Fenton Malling M, PA-C  cetirizine (ZYRTEC) 10 MG tablet Take by mouth.   Yes [provider]  cyclobenzaprine (FLEXERIL) 10 MG tablet TAKE 1 TABLET BY MOUTH THREE TIMES DAILY AS NEEDED FOR MUSCLE SPASM 03/09/18  Yes Mar Daring, PA-C  doxycycline (VIBRA-TABS) 100 MG tablet Take 1 tablet (100 mg total) by mouth daily. As needed 01/18/18  Yes Mar Daring,  PA-C  fluticasone [facility]) 50 MCG/ACT nasal spray USE 2 SPRAY(S) IN EACH NOSTRIL ONCE DAILY 12/01/17  Yes Burnette, Clearnce Sorrel, PA-C  levothyroxine (SYNTHROID, LEVOTHROID) 125 MCG tablet TAKE 1 TABLET BY MOUTH ONCE DAILY 01/28/18  Yes Fenton Malling M, PA-C  omeprazole (PRILOSEC) 20 MG capsule Take 20 mg by mouth daily. 05/01/18  Yes [provider]  pravastatin (PRAVACHOL) 80 MG tablet Take 0.5 tablets (40 mg total) by mouth daily. 06/16/18  Yes Fenton Malling M, PA-C  NON FORMULARY CPAP (Device) nightly    [provider]    Allergies as of 06/15/2018 - Review Complete 06/15/2018  Allergen Reaction Noted  . Nitrofurantoin monohyd macro Rash 05/15/2015    Family History  Problem Relation Age of Onset  . Breast cancer Paternal Grandmother   . Cancer Paternal Grandmother        Breast Cancer  . Hyperlipidemia Mother   . Hypertension Mother   . Hypertension Sister   . Arthritis Maternal Grandfather     Social History   Socioeconomic History  . Marital status: Married    Spouse name: Not on file  . Number of children: 2  . Years of education: Not on file  . Highest education level: Not on file  Occupational History    Employer: [employer]  Social Needs  . Financial resource strain: Not on file  . Food insecurity:    Worry: Not on file  Inability: Not on file  . Transportation needs:    Medical: Not on file    Non-medical: Not on file  Tobacco Use  . Smoking status: Never Smoker  . Smokeless tobacco: Never Used  Substance and Sexual Activity  . Alcohol use: Yes    Alcohol/week: 0.0 - 1.0 standard drinks    Comment: Occasional  . Drug use: No  . Sexual activity: Not on file  Lifestyle  . Physical activity:    Days per week: Not on file    Minutes per session: Not on file  . Stress: Not on file  Relationships  . Social connections:    Talks on phone: Not on file    Gets together: Not on file    Attends religious  service: Not on file    Active member of club or organization: Not on file    Attends meetings of clubs or organizations: Not on file    Relationship status: Not on file  . Intimate partner violence:    Fear of current or ex partner: Not on file    Emotionally abused: Not on file    Physically abused: Not on file    Forced sexual activity: Not on file  Other Topics Concern  . Not on file  Social History Narrative  . Not on file    Review of Systems: See HPI, otherwise negative ROS  Physical Exam: BP 138/75   Pulse 91   Temp 98.6 F (37 C) (Oral)   Resp 16   Ht 5\' 2"  (1.575 m)   Wt 69.9 kg   LMP 06/20/2018 Comment: no urine pregnancy needed per Dr. Amie Critchley  SpO2 97%   BMI 28.17 kg/m  General:   Alert,  pleasant and cooperative in NAD Head:  Normocephalic and atraumatic. Neck:  Supple; no masses or thyromegaly. Lungs:  Clear throughout to auscultation, normal respiratory effort.    Heart:  +S1, +S2, Regular rate and rhythm, No edema. Abdomen:  Soft, nontender and nondistended. Normal bowel sounds, without guarding, and without rebound.   Neurologic:  Alert and  oriented x4;  grossly normal neurologically.  Impression/Plan: Jordan Baker is here for an endoscopy and colonoscopy  to be performed for  evaluation of abdominal pain    Risks, benefits, limitations, and alternatives regarding endoscopy have been reviewed with the patient.  Questions have been answered.  All parties agreeable.   [name], MD  06/25/2018, 9:10 AM

## 2018-06-26 NOTE — Anesthesia Postprocedure Evaluation (Signed)
Anesthesia Post Note  Patient: Jordan Baker  Procedure(s) Performed: COLONOSCOPY WITH PROPOFOL (N/A ) ESOPHAGOGASTRODUODENOSCOPY (EGD) WITH PROPOFOL (N/A )  Patient location during evaluation: Endoscopy Anesthesia Type: General Level of consciousness: awake and alert Pain management: pain level controlled Vital Signs Assessment: post-procedure vital signs reviewed and stable Respiratory status: spontaneous breathing, nonlabored ventilation, respiratory function stable and patient connected to nasal cannula oxygen Cardiovascular status: blood pressure returned to baseline and stable Postop Assessment: no apparent nausea or vomiting Anesthetic complications: no     Last Vitals:  Vitals:   06/25/18 1022 06/25/18 1032  BP: 122/61 130/74  Pulse: 79 74  Resp: 11 12  Temp:    SpO2: 100% 100%    Last Pain:  Vitals:   06/25/18 1032  TempSrc:   PainSc: 0-No pain                 Precious Haws Piscitello

## 2018-06-28 LAB — SURGICAL PATHOLOGY

## 2018-07-18 ENCOUNTER — Encounter: Payer: Self-pay | Admitting: Gastroenterology

## 2018-07-26 ENCOUNTER — Telehealth: Payer: Self-pay | Admitting: Gastroenterology

## 2018-07-26 NOTE — Telephone Encounter (Signed)
Patient called & left message on the answering machine & would like a call back about her biopsy report.

## 2018-07-27 ENCOUNTER — Other Ambulatory Visit: Payer: Self-pay | Admitting: Physician Assistant

## 2018-07-27 DIAGNOSIS — K219 Gastro-esophageal reflux disease without esophagitis: Secondary | ICD-10-CM

## 2018-07-27 DIAGNOSIS — E039 Hypothyroidism, unspecified: Secondary | ICD-10-CM

## 2018-07-27 NOTE — Telephone Encounter (Signed)
Returned pt call regarding her biopsy results  Unable to contact LVM

## 2018-07-28 ENCOUNTER — Encounter

## 2018-07-28 ENCOUNTER — Ambulatory Visit: Payer: Self-pay | Admitting: Gastroenterology

## 2018-07-29 ENCOUNTER — Ambulatory Visit: Payer: Self-pay | Admitting: Gastroenterology

## 2018-07-29 ENCOUNTER — Encounter

## 2018-08-02 ENCOUNTER — Other Ambulatory Visit: Payer: Self-pay | Admitting: Physician Assistant

## 2018-08-02 DIAGNOSIS — Z3041 Encounter for surveillance of contraceptive pills: Secondary | ICD-10-CM

## 2018-08-03 ENCOUNTER — Ambulatory Visit (INDEPENDENT_AMBULATORY_CARE_PROVIDER_SITE_OTHER): Payer: Managed Care, Other (non HMO) | Admitting: Gastroenterology

## 2018-08-03 ENCOUNTER — Encounter: Payer: Self-pay | Admitting: Gastroenterology

## 2018-08-03 VITALS — BP 155/80 | HR 103 | Ht 62.0 in | Wt 157.0 lb

## 2018-08-03 DIAGNOSIS — R1013 Epigastric pain: Secondary | ICD-10-CM

## 2018-08-03 DIAGNOSIS — K581 Irritable bowel syndrome with constipation: Secondary | ICD-10-CM

## 2018-08-03 NOTE — Progress Notes (Signed)
Jordan Bellows MD, MRCP(U.K) [address]  [place]  [place], [place] [address]  Main: [phone]  Fax: [phone]   Primary Care Physician: Mar Daring, PA-C  Primary Gastroenterologist:  Dr. Jonathon Baker   Chief Complaint  Patient presents with  . Follow-up    Abdominal pain, Reflux    HPI: Jordan Baker is a 58 y.o. female   Summary of history :  She was seen initially on 06/15/2018 for  for acid reflux, epigastric pain, right upper quadrant pain.Marland Kitchen  Her records and note that she was previously been seen by Dr. Vira Agar and had a history of a neuroendocrine tumors which was incidentally found when she underwent her screening colonoscopy.  A 1 cm sized lesion was resected and there was some concern that the margins were not clear.  Looking back at the records the tumor was in the rectum and she underwent EMR in 2013.  Subsequently had rectal EUS in 2016 that was normal and at that point of time was recommended to have a repeat endoscopy in a years time.  I do not see any subsequent follow-up.  Tha abdominal pain began 6 months back , getting worse , usually at night , protonix made it worse, omeprazole helped. In the epigastrium , burning , no aggrevating or relieving factors. Took Ibuprofen once a week . Had a bowel movement every 2-4 days , more gas   Imaging 03/23/2018 ultrasound abdomen: Small cysts are noted in the liver which was also noted previously in 2012 04/13/2018: HIDA scan: Normal gallbladder ejection fraction patient experienced mild abdominal pain while drinking Ensure. 03/12/2018: H. pylori breath test: Negative 03/12/2018: Lipase, CMP, CBC, normal   Interval history   06/15/2018-08/03/2018  06/25/2018: EGD+colonoscopy  - 21mm colon polyp excised in the colon. Gastric polyps resected which were hyperplastic   Still waking up at night , feels hot, gets some epigastric discomfort. Lasts till she drinks some almond milk.   On omeprazole 20 mg .    Bowel movements are better after increasing diet in the fiber, occasionally still constipated. Didn't like linzess 72 mcg . Denies any weight loss.   Current Outpatient Medications  Medication Sig Dispense Refill  . ALPRAZolam (XANAX) 0.5 MG tablet Take 1 tablet (0.5 mg total) by mouth at bedtime as needed for anxiety. 90 tablet 3  . amitriptyline (ELAVIL) 25 MG tablet Take 1 tablet (25 mg total) by mouth at bedtime. (Patient taking differently: Take 25 mg by mouth at bedtime. Pt reports she takes 0.5 tablets at hs) 90 tablet 3  . amLODipine (NORVASC) 5 MG tablet TAKE 1 TABLET BY MOUTH ONCE DAILY 90 tablet 3  . AVIANE 0.1-20 MG-MCG tablet TAKE 1 TABLET BY MOUTH ONCE DAILY 84 tablet 0  . cetirizine (ZYRTEC) 10 MG tablet Take by mouth.    . cyclobenzaprine (FLEXERIL) 10 MG tablet TAKE 1 TABLET BY MOUTH THREE TIMES DAILY AS NEEDED FOR MUSCLE SPASM 30 tablet 5  . doxycycline (VIBRA-TABS) 100 MG tablet Take 1 tablet (100 mg total) by mouth daily. As needed 30 tablet 1  . fluticasone (FLONASE) 50 MCG/ACT nasal spray USE 2 SPRAY(S) IN EACH NOSTRIL ONCE DAILY 48 g 1  . levothyroxine (SYNTHROID, LEVOTHROID) 125 MCG tablet TAKE 1 TABLET BY MOUTH ONCE DAILY 90 tablet 1  . NON FORMULARY CPAP (Device) nightly    . omeprazole (PRILOSEC) 20 MG capsule TAKE 1 CAPSULE BY MOUTH ONCE DAILY 90 capsule 1  . pravastatin (PRAVACHOL) 80 MG tablet  Take 0.5 tablets (40 mg total) by mouth daily. 90 tablet 3   No current facility-administered medications for this visit.     Allergies as of 08/03/2018 - Review Complete 08/03/2018  Allergen Reaction Noted  . Nitrofurantoin monohyd macro Rash 05/15/2015    ROS:  General: Negative for anorexia, weight loss, fever, chills, fatigue, weakness. ENT: Negative for hoarseness, difficulty swallowing , nasal congestion. CV: Negative for chest pain, angina, palpitations, dyspnea on exertion, peripheral edema.  Respiratory: Negative for dyspnea at rest, dyspnea on exertion,  cough, sputum, wheezing.  GI: See history of present illness. GU:  Negative for dysuria, hematuria, urinary incontinence, urinary frequency, nocturnal urination.  Endo: Negative for unusual weight change.    Physical Examination:   BP (!) 155/80   Pulse (!) 103   Ht 5\' 2"  (1.575 m)   Wt 157 lb (71.2 kg)   BMI 28.72 kg/m   General: Well-nourished, well-developed in no acute distress.  Eyes: No icterus. Conjunctivae pink. Mouth: Oropharyngeal mucosa moist and pink , no lesions erythema or exudate. Lungs: Clear to auscultation bilaterally. Non-labored. Heart: Regular rate and rhythm, no murmurs rubs or gallops.  Abdomen: Bowel sounds are normal, nontender, nondistended, no hepatosplenomegaly or masses, no abdominal bruits or hernia , no rebound or guarding.   Extremities: No lower extremity edema. No clubbing or deformities. Neuro: Alert and oriented x 3.  Grossly intact. Skin: Warm and dry, no jaundice.   Psych: Alert and cooperative, normal mood and affect.   Imaging Studies: No results found.  Assessment and Plan:   Jordan Baker is a 58 y.o. y/o female here to follow up for  abdominal pain.  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 Her description of her pain could be related to either dyspepsia or IBS-C. On omeprazole 20 mg once a day , may still have residual symptoms. In addition constipation does not seem to be optimally treated at this point. Linzess caused a lot of cramping.   Plan 1.If no better at next visit get CT scan  2. Increase omeprazole to 20 mg BID - to treat any acid reflux not completely treated 3. Change to amitiza low dose  4. If not better can try baclofen for non acid reflux.  5. Trial of IB guard  Dr Jordan Bellows  MD,MRCP [facility]) Follow up in 4 weeks.

## 2018-08-11 ENCOUNTER — Telehealth: Payer: Self-pay | Admitting: Gastroenterology

## 2018-08-11 NOTE — Telephone Encounter (Signed)
Pt is calling she states she would like to schedule Abdomen CT scan because she is still not better

## 2018-08-11 NOTE — Telephone Encounter (Signed)
Yes go ahead

## 2018-08-12 ENCOUNTER — Other Ambulatory Visit: Payer: Self-pay

## 2018-08-12 DIAGNOSIS — K219 Gastro-esophageal reflux disease without esophagitis: Secondary | ICD-10-CM

## 2018-08-12 DIAGNOSIS — R1013 Epigastric pain: Secondary | ICD-10-CM

## 2018-08-12 NOTE — Telephone Encounter (Signed)
Spoke with pt and informed her of CT scan appointment information.

## 2018-08-19 ENCOUNTER — Ambulatory Visit
Admission: RE | Admit: 2018-08-19 | Discharge: 2018-08-19 | Disposition: A | Discharge status: Home / Self Care | Payer: Managed Care, Other (non HMO) | Source: Ambulatory Visit | Attending: Gastroenterology | Admitting: Gastroenterology

## 2018-08-19 DIAGNOSIS — K219 Gastro-esophageal reflux disease without esophagitis: Secondary | ICD-10-CM

## 2018-08-19 DIAGNOSIS — R1013 Epigastric pain: Secondary | ICD-10-CM

## 2018-08-19 MED ORDER — IOPAMIDOL (ISOVUE-300) INJECTION 61%
85.0000 mL | Freq: Once | INTRAVENOUS | Status: AC | PRN
  Administered 2018-08-19: 85 mL via INTRAVENOUS

## 2018-08-19 NOTE — Progress Notes (Signed)
Inform CT shows nothing concerning - few liver cysts suggest repeat [facility]G liver in 6-8 months to ensure they are stable

## 2018-08-24 ENCOUNTER — Encounter: Payer: Self-pay | Admitting: Gastroenterology

## 2018-08-24 ENCOUNTER — Telehealth: Payer: Self-pay

## 2018-08-24 NOTE — Telephone Encounter (Signed)
Spoke with pt and informed her of CT scan results and Dr. Georgeann Oppenheim instructions for pt to have a repeat Ultrasound of the liver in 6-8 months.

## 2018-08-24 NOTE — Telephone Encounter (Signed)
-----   Message from [name], MD sent at 08/19/2018  2:07 PM EST ----- Inform CT shows nothing concerning - few liver cysts suggest repeat USG liver in 6-8 months to ensure they are stable

## 2018-08-31 ENCOUNTER — Ambulatory Visit: Payer: Self-pay | Admitting: Gastroenterology

## 2018-09-17 ENCOUNTER — Encounter: Payer: Self-pay | Admitting: Physician Assistant

## 2018-09-17 ENCOUNTER — Ambulatory Visit (INDEPENDENT_AMBULATORY_CARE_PROVIDER_SITE_OTHER): Payer: Managed Care, Other (non HMO) | Admitting: Physician Assistant

## 2018-09-17 VITALS — BP 157/105 | HR 98 | Temp 98.4°F | Resp 16 | Ht 62.0 in | Wt 159.0 lb

## 2018-09-17 DIAGNOSIS — J40 Bronchitis, not specified as acute or chronic: Secondary | ICD-10-CM

## 2018-09-17 DIAGNOSIS — B379 Candidiasis, unspecified: Secondary | ICD-10-CM

## 2018-09-17 DIAGNOSIS — T3695XA Adverse effect of unspecified systemic antibiotic, initial encounter: Secondary | ICD-10-CM | POA: Diagnosis not present

## 2018-09-17 DIAGNOSIS — J0140 Acute pansinusitis, unspecified: Secondary | ICD-10-CM

## 2018-09-17 MED ORDER — BENZONATATE 200 MG PO CAPS: 200.0000 mg | ORAL_CAPSULE | Freq: Three times a day (TID) | ORAL | 0 refills | Status: DC | PRN

## 2018-09-17 MED ORDER — AMOXICILLIN-POT CLAVULANATE 875-125 MG PO TABS: 1.0000 | ORAL_TABLET | Freq: Two times a day (BID) | ORAL | 0 refills | Status: DC

## 2018-09-17 MED ORDER — FLUCONAZOLE 150 MG PO TABS: 150.0000 mg | ORAL_TABLET | Freq: Once | ORAL | 0 refills | Status: AC

## 2018-09-17 NOTE — Progress Notes (Signed)
Patient: Jordan Baker Female    DOB: 1960-11-30   58 y.o.   MRN: [id number] Visit Date: 09/17/2018  Today's Provider: Mar Daring, PA-C   Chief Complaint  Patient presents with  . URI   Subjective:    I, Sulibeya S. Dimas, CMA, am acting as a Education administrator for [employer], PA-C.  HPI Upper Respiratory Infection: Patient complains of symptoms of a URI, possible sinusitis. Symptoms include left ear pain, congestion and cough. Onset of symptoms was 5 days ago, unchanged since that time. She also c/o cough described as productive of yellow sputum and wheezing for the past 4 days .  She is drinking plenty of fluids. Evaluation to date: none. Treatment to date: cough suppressants and decongestants.    Allergies  Allergen Reactions  . Nitrofurantoin Monohyd Macro Rash     Current Outpatient Medications:  .  ALPRAZolam (XANAX) 0.5 MG tablet, Take 1 tablet (0.5 mg total) by mouth at bedtime as needed for anxiety., Disp: 90 tablet, Rfl: 3 .  amitriptyline (ELAVIL) 25 MG tablet, Take 1 tablet (25 mg total) by mouth at bedtime. (Patient taking differently: Take 25 mg by mouth at bedtime. Pt reports she takes 0.5 tablets at hs), Disp: 90 tablet, Rfl: 3 .  amLODipine (NORVASC) 5 MG tablet, TAKE 1 TABLET BY MOUTH ONCE DAILY, Disp: 90 tablet, Rfl: 3 .  AVIANE 0.1-20 MG-MCG tablet, TAKE 1 TABLET BY MOUTH ONCE DAILY, Disp: 84 tablet, Rfl: 0 .  cetirizine (ZYRTEC) 10 MG tablet, Take by mouth., Disp: , Rfl:  .  cyclobenzaprine (FLEXERIL) 10 MG tablet, TAKE 1 TABLET BY MOUTH THREE TIMES DAILY AS NEEDED FOR MUSCLE SPASM, Disp: 30 tablet, Rfl: 5 .  doxycycline (VIBRA-TABS) 100 MG tablet, Take 1 tablet (100 mg total) by mouth daily. As needed, Disp: 30 tablet, Rfl: 1 .  fluticasone (FLONASE) 50 MCG/ACT nasal spray, USE 2 SPRAY(S) IN EACH NOSTRIL ONCE DAILY, Disp: 48 g, Rfl: 1 .  levothyroxine (SYNTHROID, LEVOTHROID) 125 MCG tablet, TAKE 1 TABLET BY MOUTH ONCE DAILY, Disp: 90 tablet, Rfl:  1 .  NON FORMULARY, CPAP (Device) nightly, Disp: , Rfl:  .  omeprazole (PRILOSEC) 20 MG capsule, TAKE 1 CAPSULE BY MOUTH ONCE DAILY, Disp: 90 capsule, Rfl: 1 .  pravastatin (PRAVACHOL) 80 MG tablet, Take 0.5 tablets (40 mg total) by mouth daily., Disp: 90 tablet, Rfl: 3  Review of Systems  Constitutional: Negative.   HENT: Positive for congestion, ear discharge, postnasal drip, sinus pain and sore throat.   Eyes: Negative for visual disturbance.  Respiratory: Positive for cough and wheezing.   Cardiovascular: Negative.   Gastrointestinal: Negative.   Neurological: Positive for headaches. Negative for dizziness.    Social History   Tobacco Use  . Smoking status: Never Smoker  . Smokeless tobacco: Never Used  Substance Use Topics  . Alcohol use: Yes    Alcohol/week: 0.0 - 1.0 standard drinks    Comment: Occasional      Objective:   BP (!) 157/105 (BP Location: Left Arm, Patient Position: Sitting, Cuff Size: Normal)   Pulse 98   Temp 98.4 F (36.9 C) (Oral)   Resp 16   Ht 5\' 2"  (1.575 m)   Wt 159 lb (72.1 kg)   SpO2 97%   BMI 29.08 kg/m  Vitals:   09/17/18 0836  BP: (!) 157/105  Pulse: 98  Resp: 16  Temp: 98.4 F (36.9 C)  TempSrc: Oral  SpO2: 97%  Weight: 159  lb (72.1 kg)  Height: 5\' 2"  (1.575 m)     Physical Exam Vitals signs reviewed.  Constitutional:      General: She is not in acute distress.    Appearance: She is well-developed. She is not diaphoretic.  HENT:     Head: Normocephalic and atraumatic.     Right Ear: Hearing, tympanic membrane, ear canal and external ear normal.     Left Ear: Hearing, tympanic membrane, ear canal and external ear normal.     Nose:     Right Sinus: Maxillary sinus tenderness and frontal sinus tenderness present.     Left Sinus: Maxillary sinus tenderness and frontal sinus tenderness present.     Mouth/Throat:     Mouth: Mucous membranes are moist.     Pharynx: Uvula midline. Posterior oropharyngeal erythema present. No  oropharyngeal exudate.  Neck:     Musculoskeletal: Normal range of motion and neck supple.     Thyroid: No thyromegaly.     Trachea: No tracheal deviation.  Cardiovascular:     Rate and Rhythm: Normal rate and regular rhythm.     Heart sounds: Normal heart sounds. No murmur. No friction rub. No gallop.   Pulmonary:     Effort: Pulmonary effort is normal. No respiratory distress.     Breath sounds: No stridor. Wheezing present. No rales.  Lymphadenopathy:     Cervical: No cervical adenopathy.         Assessment & Plan    1. Acute non-recurrent pansinusitis Worsening symptoms that have not responded to OTC medications. Will give augmentin as below. Continue allergy medications. Stay well hydrated and get plenty of rest. Call if no symptom improvement or if symptoms worsen. - amoxicillin-clavulanate (AUGMENTIN) 875-125 MG tablet; Take 1 tablet by mouth 2 (two) times daily.  Dispense: 20 tablet; Refill: 0  2. Bronchitis Add tessalon perles for cough. Call if no improvement and may add prednisone.  - amoxicillin-clavulanate (AUGMENTIN) 875-125 MG tablet; Take 1 tablet by mouth 2 (two) times daily.  Dispense: 20 tablet; Refill: 0 - benzonatate (TESSALON) 200 MG capsule; Take 1 capsule (200 mg total) by mouth 3 (three) times daily as needed for cough.  Dispense: 30 capsule; Refill: 0  3. Antibiotic-induced yeast infection Gets yeast infections with antibiotic use. Diflucan given as below. - fluconazole (DIFLUCAN) 150 MG tablet; Take 1 tablet (150 mg total) by mouth once for 1 dose.  Dispense: 1 tablet; Refill: 0     Mar Daring, PA-C  [facility]

## 2018-09-17 NOTE — Patient Instructions (Signed)
Acute Bronchitis, Adult  Acute bronchitis is sudden (acute) swelling of the air tubes (bronchi) in the lungs. Acute bronchitis causes these tubes to fill with mucus, which can make it hard to breathe. It can also cause coughing or wheezing. In adults, acute bronchitis usually goes away within 2 weeks. A cough caused by bronchitis may last up to 3 weeks. Smoking, allergies, and asthma can make the condition worse. Repeated episodes of bronchitis may cause further lung problems, such as chronic obstructive pulmonary disease (COPD). What are the causes? This condition can be caused by germs and by substances that irritate the lungs, including:  Cold and flu viruses. This condition is most often caused by the same virus that causes a cold.  Bacteria.  Exposure to tobacco smoke, dust, fumes, and air pollution. What increases the risk? This condition is more likely to develop in people who:  Have close contact with someone with acute bronchitis.  Are exposed to lung irritants, such as tobacco smoke, dust, fumes, and vapors.  Have a weak immune system.  Have a respiratory condition such as asthma. What are the signs or symptoms? Symptoms of this condition include:  A cough.  Coughing up clear, yellow, or green mucus.  Wheezing.  Chest congestion.  Shortness of breath.  A fever.  Body aches.  Chills.  A sore throat. How is this diagnosed? This condition is usually diagnosed with a physical exam. During the exam, your health care provider may order tests, such as chest X-rays, to rule out other conditions. He or she may also:  Test a sample of your mucus for bacterial infection.  Check the level of oxygen in your blood. This is done to check for pneumonia.  Do a chest X-ray or lung function testing to rule out pneumonia and other conditions.  Perform blood tests. Your health care provider will also ask about your symptoms and medical history. How is this treated? Most  cases of acute bronchitis clear up over time without treatment. Your health care provider may recommend:  Drinking more fluids. Drinking more makes your mucus thinner, which may make it easier to breathe.  Taking a medicine for a fever or cough.  Taking an antibiotic medicine.  Using an inhaler to help improve shortness of breath and to control a cough.  Using a cool mist vaporizer or humidifier to make it easier to breathe. Follow these instructions at home: Medicines  Take over-the-counter and prescription medicines only as told by your health care provider.  If you were prescribed an antibiotic, take it as told by your health care provider. Do not stop taking the antibiotic even if you start to feel better. General instructions   Get plenty of rest.  Drink enough fluids to keep your urine pale yellow.  Avoid smoking and secondhand smoke. Exposure to cigarette smoke or irritating chemicals will make bronchitis worse. If you smoke and you need help quitting, ask your health care provider. Quitting smoking will help your lungs heal faster.  Use an inhaler, cool mist vaporizer, or humidifier as told by your health care provider.  Keep all follow-up visits as told by your health care provider. This is important. How is this prevented? To lower your risk of getting this condition again:  Wash your hands often with soap and water. If soap and water are not available, use hand sanitizer.  Avoid contact with people who have cold symptoms.  Try not to touch your hands to your mouth, nose, or eyes.    Make sure to get the flu shot every year. Contact a health care provider if:  Your symptoms do not improve in 2 weeks of treatment. Get help right away if:  You cough up blood.  You have chest pain.  You have severe shortness of breath.  You become dehydrated.  You faint or keep feeling like you are going to faint.  You keep vomiting.  You have a severe headache.  Your  fever or chills gets worse. This information is not intended to replace advice given to you by your health care provider. Make sure you discuss any questions you have with your health care provider. Document Released: 08/07/2004 Document Revised: 02/11/2017 Document Reviewed: 12/19/2015 Elsevier Interactive Patient Education  2019 [employer] Sinusitis, Adult Sinusitis is inflammation of your sinuses. Sinuses are hollow spaces in the bones around your face. Your sinuses are located:  Around your eyes.  In the middle of your forehead.  Behind your nose.  In your cheekbones. Mucus normally drains out of your sinuses. When your nasal tissues become inflamed or swollen, mucus can become trapped or blocked. This allows bacteria, viruses, and fungi to grow, which leads to infection. Most infections of the sinuses are caused by a virus. Sinusitis can develop quickly. It can last for up to 4 weeks (acute) or for more than 12 weeks (chronic). Sinusitis often develops after a cold. What are the causes? This condition is caused by anything that creates swelling in the sinuses or stops mucus from draining. This includes:  Allergies.  Asthma.  Infection from bacteria or viruses.  Deformities or blockages in your nose or sinuses.  Abnormal growths in the nose (nasal polyps).  Pollutants, such as chemicals or irritants in the air.  Infection from fungi (rare). What increases the risk? You are more likely to develop this condition if you:  Have a weak body defense system (immune system).  Do a lot of swimming or diving.  Overuse nasal sprays.  Smoke. What are the signs or symptoms? The main symptoms of this condition are pain and a feeling of pressure around the affected sinuses. Other symptoms include:  Stuffy nose or congestion.  Thick drainage from your nose.  Swelling and warmth over the affected sinuses.  Headache.  Upper toothache.  A cough that may get worse at  night.  Extra mucus that collects in the throat or the back of the nose (postnasal drip).  Decreased sense of smell and taste.  Fatigue.  A fever.  Sore throat.  Bad breath. How is this diagnosed? This condition is diagnosed based on:  Your symptoms.  Your medical history.  A physical exam.  Tests to find out if your condition is acute or chronic. This may include: ? Checking your nose for nasal polyps. ? Viewing your sinuses using a device that has a light (endoscope). ? Testing for allergies or bacteria. ? Imaging tests, such as an MRI or CT scan. In rare cases, a bone biopsy may be done to rule out more serious types of fungal sinus disease. How is this treated? Treatment for sinusitis depends on the cause and whether your condition is chronic or acute.  If caused by a virus, your symptoms should go away on their own within 10 days. You may be given medicines to relieve symptoms. They include: ? Medicines that shrink swollen nasal passages (topical intranasal decongestants). ? Medicines that treat allergies (antihistamines). ? A spray that eases inflammation of the nostrils (topical intranasal corticosteroids). ? Rinses  that help get rid of thick mucus in your nose (nasal saline washes).  If caused by bacteria, your health care provider may recommend waiting to see if your symptoms improve. Most bacterial infections will get better without antibiotic medicine. You may be given antibiotics if you have: ? A severe infection. ? A weak immune system.  If caused by narrow nasal passages or nasal polyps, you may need to have surgery. Follow these instructions at home: Medicines  Take, use, or apply over-the-counter and prescription medicines only as told by your health care provider. These may include nasal sprays.  If you were prescribed an antibiotic medicine, take it as told by your health care provider. Do not stop taking the antibiotic even if you start to feel  better. Hydrate and humidify   Drink enough fluid to keep your urine pale yellow. Staying hydrated will help to thin your mucus.  Use a cool mist humidifier to keep the humidity level in your home above 50%.  Inhale steam for 10-15 minutes, 3-4 times a day, or as told by your health care provider. You can do this in the bathroom while a hot shower is running.  Limit your exposure to cool or dry air. Rest  Rest as much as possible.  Sleep with your head raised (elevated).  Make sure you get enough sleep each night. General instructions   Apply a warm, moist washcloth to your face 3-4 times a day or as told by your health care provider. This will help with discomfort.  Wash your hands often with soap and water to reduce your exposure to germs. If soap and water are not available, use hand sanitizer.  Do not smoke. Avoid being around people who are smoking (secondhand smoke).  Keep all follow-up visits as told by your health care provider. This is important. Contact a health care provider if:  You have a fever.  Your symptoms get worse.  Your symptoms do not improve within 10 days. Get help right away if:  You have a severe headache.  You have persistent vomiting.  You have severe pain or swelling around your face or eyes.  You have vision problems.  You develop confusion.  Your neck is stiff.  You have trouble breathing. Summary  Sinusitis is soreness and inflammation of your sinuses. Sinuses are hollow spaces in the bones around your face.  This condition is caused by nasal tissues that become inflamed or swollen. The swelling traps or blocks the flow of mucus. This allows bacteria, viruses, and fungi to grow, which leads to infection.  If you were prescribed an antibiotic medicine, take it as told by your health care provider. Do not stop taking the antibiotic even if you start to feel better.  Keep all follow-up visits as told by your health care provider.  This is important. This information is not intended to replace advice given to you by your health care provider. Make sure you discuss any questions you have with your health care provider. Document Released: 06/30/2005 Document Revised: 11/30/2017 Document Reviewed: 11/30/2017 Elsevier Interactive Patient Education  2019 [employer].

## 2018-09-20 ENCOUNTER — Encounter: Payer: Self-pay | Admitting: Physician Assistant

## 2018-09-20 DIAGNOSIS — J069 Acute upper respiratory infection, unspecified: Secondary | ICD-10-CM

## 2018-09-20 MED ORDER — AZITHROMYCIN 250 MG PO TABS: ORAL_TABLET | ORAL | 0 refills | Status: DC

## 2018-10-11 ENCOUNTER — Other Ambulatory Visit: Payer: Self-pay | Admitting: Physician Assistant

## 2018-10-11 DIAGNOSIS — F411 Generalized anxiety disorder: Secondary | ICD-10-CM

## 2018-10-11 DIAGNOSIS — J301 Allergic rhinitis due to pollen: Secondary | ICD-10-CM

## 2018-10-11 DIAGNOSIS — Z3041 Encounter for surveillance of contraceptive pills: Secondary | ICD-10-CM

## 2018-12-29 ENCOUNTER — Other Ambulatory Visit: Payer: Self-pay | Admitting: Physician Assistant

## 2018-12-29 DIAGNOSIS — E039 Hypothyroidism, unspecified: Secondary | ICD-10-CM

## 2018-12-29 DIAGNOSIS — K219 Gastro-esophageal reflux disease without esophagitis: Secondary | ICD-10-CM

## 2019-01-24 ENCOUNTER — Ambulatory Visit (INDEPENDENT_AMBULATORY_CARE_PROVIDER_SITE_OTHER): Payer: Managed Care, Other (non HMO) | Admitting: Physician Assistant

## 2019-01-24 ENCOUNTER — Encounter: Payer: Self-pay | Admitting: Physician Assistant

## 2019-01-24 ENCOUNTER — Other Ambulatory Visit: Payer: Self-pay

## 2019-01-24 VITALS — BP 142/98 | HR 111 | Temp 98.7°F | Resp 16 | Wt 159.0 lb

## 2019-01-24 DIAGNOSIS — M1990 Unspecified osteoarthritis, unspecified site: Secondary | ICD-10-CM

## 2019-01-24 MED ORDER — PREDNISONE 20 MG PO TABS: 20.0000 mg | ORAL_TABLET | Freq: Every day | ORAL | 0 refills | Status: DC

## 2019-01-24 NOTE — Progress Notes (Signed)
Patient: Jordan Baker Female    DOB: 10/05/1960   58 y.o.   MRN: [id number] Visit Date: 01/24/2019  Today's Provider: Mar Daring, PA-C   Chief Complaint  Patient presents with  . Arthritis   Subjective:     HPI  Patient here today with c/o Arthritis in hands, feet, knees, and hips. Reports some swelling on her hands and toes. Taking IBU once a day,  Tylenol and CBD oil. No family history of RA or autoimmune disorders that she is aware of.   Allergies  Allergen Reactions  . Augmentin [Amoxicillin-Pot Clavulanate] Rash  . Nitrofurantoin Monohyd Macro Rash     Current Outpatient Medications:  .  ALPRAZolam (XANAX) 0.5 MG tablet, TAKE 1 TABLET BY MOUTH AT BEDTIME AS NEEDED FOR ANXIETY, Disp: 90 tablet, Rfl: 1 .  amitriptyline (ELAVIL) 25 MG tablet, Take 1 tablet (25 mg total) by mouth at bedtime. (Patient taking differently: Take 25 mg by mouth at bedtime. Pt reports she takes 0.5 tablets at hs), Disp: 90 tablet, Rfl: 3 .  amLODipine (NORVASC) 5 MG tablet, TAKE 1 TABLET BY MOUTH ONCE DAILY, Disp: 90 tablet, Rfl: 3 .  AVIANE 0.1-20 MG-MCG tablet, Take 1 tablet by mouth once daily, Disp: 84 tablet, Rfl: 3 .  cetirizine (ZYRTEC) 10 MG tablet, Take by mouth., Disp: , Rfl:  .  cyclobenzaprine (FLEXERIL) 10 MG tablet, TAKE 1 TABLET BY MOUTH THREE TIMES DAILY AS NEEDED FOR MUSCLE SPASM, Disp: 30 tablet, Rfl: 5 .  doxycycline (VIBRA-TABS) 100 MG tablet, Take 1 tablet (100 mg total) by mouth daily. As needed, Disp: 30 tablet, Rfl: 1 .  fluticasone (FLONASE) 50 MCG/ACT nasal spray, Use 2 spray(s) in each nostril once daily, Disp: 48 g, Rfl: 3 .  levothyroxine (SYNTHROID) 125 MCG tablet, Take 1 tablet by mouth once daily, Disp: 90 tablet, Rfl: 1 .  NON FORMULARY, CPAP (Device) nightly, Disp: , Rfl:  .  omeprazole (PRILOSEC) 20 MG capsule, Take 1 capsule by mouth once daily, Disp: 90 capsule, Rfl: 1 .  pravastatin (PRAVACHOL) 80 MG tablet, Take 0.5 tablets (40 mg total) by  mouth daily., Disp: 90 tablet, Rfl: 3  Review of Systems  Constitutional: Negative.   Respiratory: Negative.   Cardiovascular: Negative.   Gastrointestinal: Negative.   Musculoskeletal: Positive for arthralgias and joint swelling.  Neurological: Negative.     Social History   Tobacco Use  . Smoking status: Never Smoker  . Smokeless tobacco: Never Used  Substance Use Topics  . Alcohol use: Yes    Alcohol/week: 0.0 - 1.0 standard drinks    Comment: Occasional      Objective:   BP (!) 142/98 (BP Location: Left Arm, Patient Position: Sitting, Cuff Size: Large)   Pulse (!) 111   Temp 98.7 F (37.1 C) (Oral)   Resp 16   Wt 159 lb (72.1 kg)   BMI 29.08 kg/m  Vitals:   01/24/19 1537  BP: (!) 142/98  Pulse: (!) 111  Resp: 16  Temp: 98.7 F (37.1 C)  TempSrc: Oral  Weight: 159 lb (72.1 kg)     Physical Exam Vitals signs reviewed.  Constitutional:      General: She is not in acute distress.    Appearance: Normal appearance. She is well-developed. She is not ill-appearing or diaphoretic.  Neck:     Musculoskeletal: Normal range of motion and neck supple.     Thyroid: No thyromegaly.     Vascular: No JVD.  Trachea: No tracheal deviation.  Cardiovascular:     Rate and Rhythm: Normal rate and regular rhythm.     Pulses: Normal pulses.     Heart sounds: Normal heart sounds. No murmur. No friction rub. No gallop.   Pulmonary:     Effort: Pulmonary effort is normal. No respiratory distress.     Breath sounds: Normal breath sounds. No wheezing or rales.  Musculoskeletal:     Comments: Hands are swollen and tender at almost all joints. Mild redness noted. Strength and ROM normal.   Knees noted to have mild swelling. No redness. No tenderness to touch. ROM and strength normal.   Hip no swelling, crepitus, tenderness to touch. ROM and strength normal.  Feet with mild swelling in the toes through mid forefoot. No tenderness, redness. ROM and strength in ankle normal.    Neurovasc grossly intact.   Lymphadenopathy:     Cervical: No cervical adenopathy.  Neurological:     Mental Status: She is alert.      No results found for any visits on 01/24/19.     Assessment & Plan    1. Arthritis Multiple areas inflamed currently. Will treat with prednisone as below. Will check labs as below to r/o other sources of multiple joint inflammation. I will f/u pending lab results.  Call if not improving and may consider imaging or worst joints vs referral to rheumatology.  - ANA,IFA RA Diag Pnl w/rflx Tit/Patn - C-reactive protein - Sed Rate (ESR) - CBC w/Diff/Platelet - predniSONE (DELTASONE) 20 MG tablet; Take 1 tablet (20 mg total) by mouth daily with breakfast.  Dispense: 10 tablet; Refill: 0     Mar Daring, PA-C  [facility]

## 2019-01-26 ENCOUNTER — Encounter: Payer: Self-pay | Admitting: Physician Assistant

## 2019-01-26 ENCOUNTER — Telehealth: Payer: Self-pay

## 2019-01-26 LAB — CBC WITH DIFFERENTIAL/PLATELET
Basophils Absolute: 0 10*3/uL (ref 0.0–0.2)
Basos: 1 %
EOS (ABSOLUTE): 0.2 10*3/uL (ref 0.0–0.4)
Eos: 3 %
Hematocrit: 36.8 % (ref 34.0–46.6)
Hemoglobin: 12.4 g/dL (ref 11.1–15.9)
Immature Grans (Abs): 0 10*3/uL (ref 0.0–0.1)
Immature Granulocytes: 0 %
Lymphocytes Absolute: 1.7 10*3/uL (ref 0.7–3.1)
Lymphs: 30 %
MCH: 28.9 pg (ref 26.6–33.0)
MCHC: 33.7 g/dL (ref 31.5–35.7)
MCV: 86 fL (ref 79–97)
Monocytes Absolute: 0.4 10*3/uL (ref 0.1–0.9)
Monocytes: 7 %
Neutrophils Absolute: 3.2 10*3/uL (ref 1.4–7.0)
Neutrophils: 59 %
Platelets: 302 10*3/uL (ref 150–450)
RBC: 4.29 x10E6/uL (ref 3.77–5.28)
RDW: 12.7 % (ref 11.7–15.4)
WBC: 5.4 10*3/uL (ref 3.4–10.8)

## 2019-01-26 LAB — ANA,IFA RA DIAG PNL W/RFLX TIT/PATN
ANA Titer 1: NEGATIVE
Cyclic Citrullin Peptide Ab: 7 units (ref 0–19)
Rheumatoid fact SerPl-aCnc: <10 IU/mL (ref 0.0–13.9)

## 2019-01-26 LAB — C-REACTIVE PROTEIN: CRP: 5 mg/L (ref 0–10)

## 2019-01-26 LAB — SEDIMENTATION RATE: Sed Rate: 17 mm/hr (ref 0–40)

## 2019-01-26 NOTE — Telephone Encounter (Signed)
-----   Message from Mar Daring, [place] sent at 01/26/2019  3:34 PM EDT ----- All labs are within normal limits and stable. No inflammatory markers positive or autoimmune/RA markers positive. Would you still like Rheumatology referral? Thanks! -JB

## 2019-01-26 NOTE — Telephone Encounter (Signed)
Viewed by Ramond Craver on 01/26/2019 3:54 PM Written by Mar Daring, PA-C on 01/26/2019 3:34 PM All labs are within normal limits and stable. No inflammatory markers positive or autoimmune/RA markers positive. Would you still like Rheumatology referral? Thanks! -JB

## 2019-02-02 ENCOUNTER — Encounter: Payer: Self-pay | Admitting: Physician Assistant

## 2019-03-14 ENCOUNTER — Other Ambulatory Visit: Payer: Self-pay | Admitting: Physician Assistant

## 2019-03-14 DIAGNOSIS — M792 Neuralgia and neuritis, unspecified: Secondary | ICD-10-CM

## 2019-03-14 MED ORDER — AMITRIPTYLINE HCL 25 MG PO TABS: 25.0000 mg | ORAL_TABLET | Freq: Every day | ORAL | 3 refills | Status: DC

## 2019-03-14 NOTE — Telephone Encounter (Signed)
Pt needs a refill on Amitriptyline 25 mg  [place]  [facility]

## 2019-03-27 ENCOUNTER — Other Ambulatory Visit: Payer: Self-pay | Admitting: Physician Assistant

## 2019-03-27 DIAGNOSIS — M62830 Muscle spasm of back: Secondary | ICD-10-CM

## 2019-04-19 ENCOUNTER — Other Ambulatory Visit: Payer: Self-pay | Admitting: Physician Assistant

## 2019-04-19 DIAGNOSIS — Z1231 Encounter for screening mammogram for malignant neoplasm of breast: Secondary | ICD-10-CM

## 2019-04-26 ENCOUNTER — Other Ambulatory Visit: Payer: Self-pay | Admitting: Physician Assistant

## 2019-04-26 DIAGNOSIS — M62830 Muscle spasm of back: Secondary | ICD-10-CM

## 2019-05-26 ENCOUNTER — Ambulatory Visit
Admission: RE | Admit: 2019-05-26 | Discharge: 2019-05-26 | Disposition: A | Discharge status: Home / Self Care | Payer: Managed Care, Other (non HMO) | Source: Ambulatory Visit | Attending: Physician Assistant | Admitting: Physician Assistant

## 2019-05-26 DIAGNOSIS — Z1231 Encounter for screening mammogram for malignant neoplasm of breast: Secondary | ICD-10-CM | POA: Diagnosis not present

## 2019-05-27 ENCOUNTER — Telehealth: Payer: Self-pay

## 2019-05-27 NOTE — Telephone Encounter (Signed)
-----   Message from Mar Daring, [place] sent at 05/27/2019 12:13 PM EST ----- Normal mammogram. Repeat screening in one year.

## 2019-05-27 NOTE — Telephone Encounter (Signed)
Patient advised as directed below. 

## 2019-06-22 NOTE — Progress Notes (Signed)
Patient: Jordan Baker, Female    DOB: November 04, 1960, 58 y.o.   MRN: [id number] Visit Date: 06/23/2019  Today's Provider: Mar Daring, PA-C   Chief Complaint  Patient presents with  . Annual Exam   Subjective:     Annual physical exam Jordan Baker is a 58 y.o. female who presents today for health maintenance and complete physical. She feels well. She reports exercising includes walking. She reports she is sleeping fairly well. ----------------------------------------------------------------- 06/04/2016-Pap is normal, HPV negative. Will repeat in 3 years. 05/27/2019 -Normal mammogram. Repeat screening in one year. 06/25/2018-Colonoscopy Flu vaccine was given through [facility]  Review of Systems  Constitutional: Negative.   HENT: Negative.   Eyes: Positive for visual disturbance.  Respiratory: Negative.   Cardiovascular: Negative.   Gastrointestinal: Positive for constipation.  Endocrine: Negative.   Genitourinary: Negative.   Musculoskeletal: Positive for arthralgias.  Skin: Negative.   Allergic/Immunologic: Negative.   Neurological: Negative.   Hematological: Negative.   Psychiatric/Behavioral: Positive for sleep disturbance (CPAP occasionally wakes her up). The patient is nervous/anxious.     Social History      She  reports that she has never smoked. She has never used smokeless tobacco. She reports current alcohol use. She reports that she does not use drugs.       Social History   Socioeconomic History  . Marital status: Married    Spouse name: Not on file  . Number of children: 2  . Years of education: Not on file  . Highest education level: Not on file  Occupational History    Employer: [employer]  Tobacco Use  . Smoking status: Never Smoker  . Smokeless tobacco: Never Used  Substance and Sexual Activity  . Alcohol use: Yes    Alcohol/week: 0.0 - 1.0 standard drinks    Comment: Occasional  . Drug use: No  .  Sexual activity: Not on file  Other Topics Concern  . Not on file  Social History Narrative  . Not on file   Social Determinants of Health   Financial Resource Strain:   . Difficulty of Paying Living Expenses: Not on file  Food Insecurity:   . Worried About Charity fundraiser in the Last Year: Not on file  . Ran Out of Food in the Last Year: Not on file  Transportation Needs:   . Lack of Transportation (Medical): Not on file  . Lack of Transportation (Non-Medical): Not on file  Physical Activity:   . Days of Exercise per Week: Not on file  . Minutes of Exercise per Session: Not on file  Stress:   . Feeling of Stress : Not on file  Social Connections:   . Frequency of Communication with Friends and Family: Not on file  . Frequency of Social Gatherings with Friends and Family: Not on file  . Attends Religious Services: Not on file  . Active Member of Clubs or Organizations: Not on file  . Attends Archivist Meetings: Not on file  . Marital Status: Not on file    Past Medical History:  Diagnosis Date  . Anxiety   . GERD (gastroesophageal reflux disease)   . Hyperlipidemia   . Hypothyroidism      Patient Active Problem List   Diagnosis Date Noted  . S/P left unicompartmental knee replacement 07/02/2017  . Chronic Arthralgia of knees (Primary Area of Pain) (Bilateral) (L>R) 02/12/2017  . Osteoarthritis of knee (Bilateral) (L>R) 01/05/2017  .  Chronic knee pain (Primary Area of Pain) (Bilateral) (L>R) 12/31/2016  . Incomplete emptying of bladder 06/22/2016  . Benign essential HTN 06/10/2016  . Stable angina pectoris ([place]) 06/10/2016  . Abnormal blood sugar 06/02/2016  . Fibroid 05/31/2015  . Hypercholesterolemia 05/31/2015  . Hot flash, menopausal 05/31/2015  . GERD (gastroesophageal reflux disease) 05/15/2015  . Hypothyroidism 05/15/2015  . Anxiety disorder 05/15/2015  . Sleep apnea 05/15/2015  . Insomnia 05/15/2015  . History of neuroendocrine cancer  02/02/2013  . Bladder infection, chronic 08/10/2012  . Female genital symptoms 08/10/2012  . Increased frequency of urination 08/10/2012    Past Surgical History:  Procedure Laterality Date  . BIOPSY RECTAL    . CESAREAN SECTION  1990/1993   x2  . COLONOSCOPY WITH PROPOFOL N/A 06/25/2018   Procedure: COLONOSCOPY WITH PROPOFOL;  Surgeon: [name], MD;  Location: [facility] ENDOSCOPY;  Service: Gastroenterology;  Laterality: N/A;  . ESOPHAGOGASTRODUODENOSCOPY (EGD) WITH PROPOFOL N/A 06/25/2018   Procedure: ESOPHAGOGASTRODUODENOSCOPY (EGD) WITH PROPOFOL;  Surgeon: [name], MD;  Location: [facility] ENDOSCOPY;  Service: Gastroenterology;  Laterality: N/A;  . EUS N/A 05/17/2015   Procedure: LOWER ENDOSCOPIC ULTRASOUND (EUS);  Surgeon: Holly Bodily, MD;  Location: [facility] ENDOSCOPY;  Service: Gastroenterology;  Laterality: N/A;  . JOINT REPLACEMENT Left 06/2017  . OVARIAN CYST SURGERY  1993  . partial knee replacemt Left 06/24/2017    Family History        Family Status  Relation Name Status  . PGM  Deceased at age 60       died from old age  . Mother  Alive  . Father  Deceased at age 77       Lung cancer  . Sister 1 Alive  . MGM  Deceased at age 39       died from old age  . MGF  Deceased at age 67's       died from congestiv heart failure.  Marland Kitchen PGF  Deceased at age 64       died from old age  . Sister 2 Alive  . Son  Alive  . Son  Alive        Her family history includes Arthritis in her maternal grandfather; Breast cancer in her paternal grandmother; Cancer in her paternal grandmother; Hyperlipidemia in her mother; Hypertension in her mother and sister.      Allergies  Allergen Reactions  . Augmentin [Amoxicillin-Pot Clavulanate] Rash  . Nitrofurantoin Monohyd Macro Rash     Current Outpatient Medications:  .  ALPRAZolam (XANAX) 0.5 MG tablet, TAKE 1 TABLET BY MOUTH AT BEDTIME AS NEEDED FOR ANXIETY, Disp: 90 tablet, Rfl: 1 .  amitriptyline (ELAVIL) 25 MG tablet, Take 1  tablet (25 mg total) by mouth at bedtime., Disp: 90 tablet, Rfl: 3 .  amLODipine (NORVASC) 5 MG tablet, TAKE 1 TABLET BY MOUTH ONCE DAILY, Disp: 90 tablet, Rfl: 3 .  cetirizine (ZYRTEC) 10 MG tablet, Take by mouth., Disp: , Rfl:  .  cyclobenzaprine (FLEXERIL) 10 MG tablet, Take 1 tablet by mouth three times daily as needed for muscle spasm, Disp: 30 tablet, Rfl: 0 .  doxycycline (VIBRA-TABS) 100 MG tablet, Take 1 tablet (100 mg total) by mouth daily. As needed, Disp: 30 tablet, Rfl: 1 .  fluticasone (FLONASE) 50 MCG/ACT nasal spray, Use 2 spray(s) in each nostril once daily, Disp: 48 g, Rfl: 3 .  levothyroxine (SYNTHROID) 125 MCG tablet, Take 1 tablet by mouth once daily, Disp: 90 tablet, Rfl: 1 .  NON FORMULARY, CPAP (Device) nightly, Disp: , Rfl:  .  omeprazole (PRILOSEC) 20 MG capsule, Take 1 capsule by mouth once daily, Disp: 90 capsule, Rfl: 1 .  pravastatin (PRAVACHOL) 80 MG tablet, Take 0.5 tablets (40 mg total) by mouth daily., Disp: 90 tablet, Rfl: 3 .  AVIANE 0.1-20 MG-MCG tablet, Take 1 tablet by mouth once daily, Disp: 84 tablet, Rfl: 3 .  predniSONE (DELTASONE) 20 MG tablet, Take 1 tablet (20 mg total) by mouth daily with breakfast., Disp: 10 tablet, Rfl: 0   Patient Care Team: Mar Daring, PA-C as PCP - General (Family Medicine)    Objective:    Vitals: BP (!) 153/81 (BP Location: Left Arm, Patient Position: Sitting, Cuff Size: Normal)   Pulse 93   Temp (!) 97.3 F (36.3 C) (Temporal)   Ht 5\' 2"  (1.575 m)   Wt 154 lb 9.6 oz (70.1 kg)   LMP 06/20/2018 Comment: no urine pregnancy needed per Dr. Amie Critchley  BMI 28.28 kg/m    Vitals:   06/23/19 0907  BP: (!) 153/81  Pulse: 93  Temp: (!) 97.3 F (36.3 C)  TempSrc: Temporal  Weight: 154 lb 9.6 oz (70.1 kg)  Height: 5\' 2"  (1.575 m)     Physical Exam Vitals reviewed.  Constitutional:      General: She is not in acute distress.    Appearance: Normal appearance. She is well-developed. She is not ill-appearing  or diaphoretic.  HENT:     Head: Normocephalic and atraumatic.     Right Ear: Hearing, tympanic membrane, ear canal and external ear normal.     Left Ear: Hearing, tympanic membrane, ear canal and external ear normal.     Mouth/Throat:     Pharynx: Uvula midline.  Eyes:     General: No scleral icterus.       Right eye: No discharge.        Left eye: No discharge.     Extraocular Movements: Extraocular movements intact.     Conjunctiva/sclera: Conjunctivae normal.     Pupils: Pupils are equal, round, and reactive to light.  Neck:     Thyroid: No thyromegaly.     Vascular: No carotid bruit or JVD.     Trachea: No tracheal deviation.  Cardiovascular:     Rate and Rhythm: Normal rate and regular rhythm.     Pulses: Normal pulses.     Heart sounds: Normal heart sounds. No murmur. No friction rub. No gallop.   Pulmonary:     Effort: Pulmonary effort is normal. No respiratory distress.     Breath sounds: Normal breath sounds. No wheezing or rales.  Chest:     Chest wall: No tenderness.     Breasts: Breasts are symmetrical.   Abdominal:     General: Abdomen is flat. Bowel sounds are normal. There is no distension.     Palpations: Abdomen is soft. There is no mass.     Tenderness: There is no abdominal tenderness. There is no guarding or rebound.     Hernia: There is no hernia in the left inguinal area.  Genitourinary:    Exam position: Supine.     Labia:        Right: No rash, tenderness, lesion or injury.        Left: No rash, tenderness, lesion or injury.      Vagina: Normal. No signs of injury. No vaginal discharge, erythema, tenderness or bleeding.     Cervix: No cervical motion tenderness, discharge  or friability.     Adnexa:        Right: No mass, tenderness or fullness.         Left: No mass, tenderness or fullness.       Rectum: Normal.  Musculoskeletal:        General: No tenderness. Normal range of motion.     Cervical back: Normal range of motion and neck supple.      Right lower leg: No edema.     Left lower leg: No edema.  Lymphadenopathy:     Cervical: No cervical adenopathy.  Skin:    General: Skin is warm and dry.     Capillary Refill: Capillary refill takes less than 2 seconds.     Findings: No rash.  Neurological:     General: No focal deficit present.     Mental Status: She is alert and oriented to person, place, and time. Mental status is at baseline.     Cranial Nerves: No cranial nerve deficit.     Coordination: Coordination normal.     Deep Tendon Reflexes: Reflexes are normal and symmetric.  Psychiatric:        Mood and Affect: Mood normal.        Behavior: Behavior normal.        Thought Content: Thought content normal.        Judgment: Judgment normal.      Depression Screen PHQ 2/9 Scores 06/23/2019 06/16/2018 06/09/2017 02/12/2017  PHQ - 2 Score 0 0 0 0  PHQ- 9 Score 0 2 1 -       Assessment & Plan:     Routine Health Maintenance and Physical Exam  Exercise Activities and Dietary recommendations Goals   None     Immunization History  Administered Date(s) Administered  . Influenza-Unspecified 03/22/2018  . Td 06/02/2016  . Tdap 10/21/2005    Health Maintenance  Topic Date Due  . INFLUENZA VACCINE  02/12/2019  . PAP SMEAR-Modifier  06/03/2019  . MAMMOGRAM  05/25/2021  . COLONOSCOPY  06/26/2023  . TETANUS/TDAP  06/02/2026  . Hepatitis C Screening  Completed  . HIV Screening  Completed     Discussed health benefits of physical activity, and encouraged her to engage in regular exercise appropriate for her age and condition.    1. Annual physical exam Normal physical exam today. Will check labs as below and f/u pending lab results. If labs are stable and WNL she will not need to have these rechecked for one year at her next annual physical exam. She is to call the office in the meantime if she has any acute issue, questions or concerns.  2. Cervical cancer screening Pap collected today. Will send as below  and f/u pending results. - Cytology - PAP  3. Benign essential HTN Stable. Diagnosis pulled for medication refill. Continue current medical treatment plan. Will check labs as below and f/u pending results. - CBC w/Diff/Platelet - Comprehensive Metabolic Panel (CMET) - TSH - Lipid Profile - HgB A1c - amLODipine (NORVASC) 5 MG tablet; Take 1 tablet (5 mg total) by mouth daily.  Dispense: 90 tablet; Refill: 3  4. Hypothyroidism due to acquired atrophy of thyroid Stable. Diagnosis pulled for medication refill. Continue current medical treatment plan. Will check labs as below and f/u pending results. - CBC w/Diff/Platelet - Comprehensive Metabolic Panel (CMET) - TSH - levothyroxine (SYNTHROID) 125 MCG tablet; Take 1 tablet (125 mcg total) by mouth daily.  Dispense: 90 tablet; Refill: 3  5.  Hypercholesterolemia Stable. Diagnosis pulled for medication refill. Continue current medical treatment plan. Will check labs as below and f/u pending results. - CBC w/Diff/Platelet - Comprehensive Metabolic Panel (CMET) - Lipid Profile - HgB A1c - pravastatin (PRAVACHOL) 40 MG tablet; Take 1 tablet (40 mg total) by mouth daily.  Dispense: 90 tablet; Refill: 3  6. Abnormal blood sugar Diet controlled. Will check labs as below and f/u pending results. - CBC w/Diff/Platelet - Comprehensive Metabolic Panel (CMET) - Lipid Profile - HgB A1c  7. History of neuroendocrine cancer No recurrence. Doing well. Will check labs as below and f/u pending results. - CBC w/Diff/Platelet - Comprehensive Metabolic Panel (CMET)  8. BMI 28.0-28.9,adult Counseled patient on healthy lifestyle modifications including dieting and exercise.   9. Stable angina pectoris ([place]) No symptoms.   10. Generalized anxiety disorder Stable. Diagnosis pulled for medication refill. Continue current medical treatment plan. - ALPRAZolam (XANAX) 0.5 MG tablet; TAKE 1 TABLET BY MOUTH AT BEDTIME AS NEEDED FOR ANXIETY  Dispense: 90  tablet; Refill: 1  11. Muscle spasm of back Stable. Diagnosis pulled for medication refill. Continue current medical treatment plan. - cyclobenzaprine (FLEXERIL) 10 MG tablet; Take 1 tablet (10 mg total) by mouth 3 (three) times daily as needed. for muscle spams  Dispense: 30 tablet; Refill: 0  12. Recurrent UTI Stable. Diagnosis pulled for medication refill. Continue current medical treatment plan. - doxycycline (VIBRA-TABS) 100 MG tablet; Take 1 tablet (100 mg total) by mouth daily. As needed  Dispense: 30 tablet; Refill: 1  13. Chronic seasonal allergic rhinitis due to pollen Stable. Diagnosis pulled for medication refill. Continue current medical treatment plan. - fluticasone (FLONASE) 50 MCG/ACT nasal spray; Use 2 spray(s) in each nostril once daily  Dispense: 48 g; Refill: 3  14. Gastroesophageal reflux disease without esophagitis Stable. Diagnosis pulled for medication refill. Continue current medical treatment plan. - omeprazole (PRILOSEC) 20 MG capsule; Take 1 capsule (20 mg total) by mouth daily.  Dispense: 90 capsule; Refill: 3  --------------------------------------------------------------------    Mar Daring, PA-C  [facility]

## 2019-06-23 ENCOUNTER — Ambulatory Visit (INDEPENDENT_AMBULATORY_CARE_PROVIDER_SITE_OTHER): Payer: Managed Care, Other (non HMO) | Admitting: Physician Assistant

## 2019-06-23 ENCOUNTER — Other Ambulatory Visit (HOSPITAL_COMMUNITY)
Admission: RE | Admit: 2019-06-23 | Discharge: 2019-06-23 | Disposition: A | Discharge status: Home / Self Care | Payer: Managed Care, Other (non HMO) | Source: Ambulatory Visit | Attending: Physician Assistant | Admitting: Physician Assistant

## 2019-06-23 ENCOUNTER — Encounter: Payer: Self-pay | Admitting: Physician Assistant

## 2019-06-23 ENCOUNTER — Other Ambulatory Visit: Payer: Self-pay

## 2019-06-23 VITALS — BP 153/81 | HR 93 | Temp 97.3°F | Ht 62.0 in | Wt 154.6 lb

## 2019-06-23 DIAGNOSIS — Z0000 Encounter for general adult medical examination without abnormal findings: Secondary | ICD-10-CM

## 2019-06-23 DIAGNOSIS — E7800 Pure hypercholesterolemia, unspecified: Secondary | ICD-10-CM

## 2019-06-23 DIAGNOSIS — E034 Atrophy of thyroid (acquired): Secondary | ICD-10-CM

## 2019-06-23 DIAGNOSIS — J301 Allergic rhinitis due to pollen: Secondary | ICD-10-CM

## 2019-06-23 DIAGNOSIS — I10 Essential (primary) hypertension: Secondary | ICD-10-CM

## 2019-06-23 DIAGNOSIS — K219 Gastro-esophageal reflux disease without esophagitis: Secondary | ICD-10-CM

## 2019-06-23 DIAGNOSIS — Z124 Encounter for screening for malignant neoplasm of cervix: Secondary | ICD-10-CM | POA: Insufficient documentation

## 2019-06-23 DIAGNOSIS — F411 Generalized anxiety disorder: Secondary | ICD-10-CM

## 2019-06-23 DIAGNOSIS — Z6828 Body mass index (BMI) 28.0-28.9, adult: Secondary | ICD-10-CM

## 2019-06-23 DIAGNOSIS — I208 Other forms of angina pectoris: Secondary | ICD-10-CM

## 2019-06-23 DIAGNOSIS — Z8589 Personal history of malignant neoplasm of other organs and systems: Secondary | ICD-10-CM

## 2019-06-23 DIAGNOSIS — R7309 Other abnormal glucose: Secondary | ICD-10-CM

## 2019-06-23 DIAGNOSIS — N390 Urinary tract infection, site not specified: Secondary | ICD-10-CM

## 2019-06-23 DIAGNOSIS — M62830 Muscle spasm of back: Secondary | ICD-10-CM

## 2019-06-23 MED ORDER — OMEPRAZOLE 20 MG PO CPDR: 20.0000 mg | DELAYED_RELEASE_CAPSULE | Freq: Every day | ORAL | 3 refills | Status: DC

## 2019-06-23 MED ORDER — CYCLOBENZAPRINE HCL 10 MG PO TABS: 10.0000 mg | ORAL_TABLET | Freq: Three times a day (TID) | ORAL | 0 refills | Status: DC | PRN

## 2019-06-23 MED ORDER — LEVOTHYROXINE SODIUM 125 MCG PO TABS: 125.0000 ug | ORAL_TABLET | Freq: Every day | ORAL | 3 refills | Status: DC

## 2019-06-23 MED ORDER — ALPRAZOLAM 0.5 MG PO TABS: ORAL_TABLET | ORAL | 1 refills | Status: DC

## 2019-06-23 MED ORDER — DOXYCYCLINE HYCLATE 100 MG PO TABS: 100.0000 mg | ORAL_TABLET | Freq: Every day | ORAL | 1 refills | Status: DC

## 2019-06-23 MED ORDER — PRAVASTATIN SODIUM 40 MG PO TABS: 40.0000 mg | ORAL_TABLET | Freq: Every day | ORAL | 3 refills | Status: DC

## 2019-06-23 MED ORDER — AMLODIPINE BESYLATE 5 MG PO TABS: 5.0000 mg | ORAL_TABLET | Freq: Every day | ORAL | 3 refills | Status: DC

## 2019-06-23 MED ORDER — FLUTICASONE PROPIONATE 50 MCG/ACT NA SUSP: NASAL | 3 refills | Status: DC

## 2019-06-23 NOTE — Patient Instructions (Signed)

## 2019-06-24 LAB — COMPREHENSIVE METABOLIC PANEL
ALT: 15 IU/L (ref 0–32)
AST: 18 IU/L (ref 0–40)
Albumin/Globulin Ratio: 2 (ref 1.2–2.2)
Albumin: 4.3 g/dL (ref 3.8–4.9)
Alkaline Phosphatase: 123 IU/L — ABNORMAL HIGH (ref 39–117)
BUN/Creatinine Ratio: 13 (ref 9–23)
BUN: 11 mg/dL (ref 6–24)
Bilirubin Total: 0.3 mg/dL (ref 0.0–1.2)
CO2: 24 mmol/L (ref 20–29)
Calcium: 9.7 mg/dL (ref 8.7–10.2)
Chloride: 104 mmol/L (ref 96–106)
Creatinine, Ser: 0.87 mg/dL (ref 0.57–1.00)
GFR calc Af Amer: 86 mL/min/{1.73_m2} (ref >59)
GFR calc non Af Amer: 74 mL/min/{1.73_m2} (ref >59)
Globulin, Total: 2.2 g/dL (ref 1.5–4.5)
Glucose: 83 mg/dL (ref 65–99)
Potassium: 4.2 mmol/L (ref 3.5–5.2)
Sodium: 143 mmol/L (ref 134–144)
Total Protein: 6.5 g/dL (ref 6.0–8.5)

## 2019-06-24 LAB — CBC WITH DIFFERENTIAL/PLATELET
Basophils Absolute: 0.1 10*3/uL (ref 0.0–0.2)
Basos: 1 %
EOS (ABSOLUTE): 0.2 10*3/uL (ref 0.0–0.4)
Eos: 4 %
Hematocrit: 38.1 % (ref 34.0–46.6)
Hemoglobin: 12.7 g/dL (ref 11.1–15.9)
Immature Grans (Abs): 0 10*3/uL (ref 0.0–0.1)
Immature Granulocytes: 0 %
Lymphocytes Absolute: 1.4 10*3/uL (ref 0.7–3.1)
Lymphs: 30 %
MCH: 28.7 pg (ref 26.6–33.0)
MCHC: 33.3 g/dL (ref 31.5–35.7)
MCV: 86 fL (ref 79–97)
Monocytes Absolute: 0.3 10*3/uL (ref 0.1–0.9)
Monocytes: 7 %
Neutrophils Absolute: 2.8 10*3/uL (ref 1.4–7.0)
Neutrophils: 58 %
Platelets: 300 10*3/uL (ref 150–450)
RBC: 4.43 x10E6/uL (ref 3.77–5.28)
RDW: 13.2 % (ref 11.7–15.4)
WBC: 4.7 10*3/uL (ref 3.4–10.8)

## 2019-06-24 LAB — HEMOGLOBIN A1C
Est. average glucose Bld gHb Est-mCnc: 114 mg/dL
Hgb A1c MFr Bld: 5.6 % (ref 4.8–5.6)

## 2019-06-24 LAB — LIPID PANEL
Chol/HDL Ratio: 2.9 ratio (ref 0.0–4.4)
Cholesterol, Total: 182 mg/dL (ref 100–199)
HDL: 63 mg/dL (ref >39)
LDL Chol Calc (NIH): 101 mg/dL — ABNORMAL HIGH (ref 0–99)
Triglycerides: 103 mg/dL (ref 0–149)
VLDL Cholesterol Cal: 18 mg/dL (ref 5–40)

## 2019-06-24 LAB — TSH: TSH: 0.083 u[IU]/mL — ABNORMAL LOW (ref 0.450–4.500)

## 2019-06-27 ENCOUNTER — Telehealth: Payer: Self-pay

## 2019-06-27 LAB — CYTOLOGY - PAP
Adequacy: ABSENT
Comment: NEGATIVE
Diagnosis: NEGATIVE
High risk HPV: NEGATIVE

## 2019-06-27 NOTE — Telephone Encounter (Signed)
-----   Message from Mar Daring, [place] sent at 06/27/2019 11:29 AM EST ----- Pap is normal, HPV negative.  Will repeat in 5 years.

## 2019-06-27 NOTE — Telephone Encounter (Signed)
Patient advised as directed below. 

## 2019-08-31 ENCOUNTER — Telehealth: Payer: Self-pay

## 2019-08-31 DIAGNOSIS — E034 Atrophy of thyroid (acquired): Secondary | ICD-10-CM

## 2019-08-31 NOTE — Telephone Encounter (Signed)
Left message advising pt that the clinic will not be open tomorrow.  Is it okay to order repeat labs?  Thanks,   -Mickel Baas

## 2019-08-31 NOTE — Telephone Encounter (Signed)
Copied from [place] [id number]. Topic: General - Inquiry >> Aug 30, 2019  4:43 PM Percell Belt A wrote: Reason for CRM: pt called in and stated she was suppose return back to get her thyroid rechecked per her last visit in dec.  She stated she was suppose to have a order put in for blood work to rec it to get her renewal prescription  She wants to come tomorrow?  Please advise  Best number [phone]

## 2019-08-31 NOTE — Telephone Encounter (Signed)
Lab reordered. She can come at her convenience.

## 2019-09-15 ENCOUNTER — Other Ambulatory Visit: Payer: Self-pay | Admitting: Physician Assistant

## 2019-09-15 ENCOUNTER — Encounter: Payer: Self-pay | Admitting: Physician Assistant

## 2019-09-15 DIAGNOSIS — E034 Atrophy of thyroid (acquired): Secondary | ICD-10-CM

## 2019-09-15 DIAGNOSIS — K219 Gastro-esophageal reflux disease without esophagitis: Secondary | ICD-10-CM

## 2019-09-15 DIAGNOSIS — Z8589 Personal history of malignant neoplasm of other organs and systems: Secondary | ICD-10-CM

## 2019-09-15 DIAGNOSIS — R1013 Epigastric pain: Secondary | ICD-10-CM

## 2019-09-15 DIAGNOSIS — Z85040 Personal history of malignant carcinoid tumor of rectum: Secondary | ICD-10-CM

## 2019-09-15 LAB — T4 AND TSH
T4, Total: 9.8 ug/dL (ref 4.5–12.0)
TSH: 0.052 u[IU]/mL — ABNORMAL LOW (ref 0.450–4.500)

## 2019-09-15 MED ORDER — LEVOTHYROXINE SODIUM 112 MCG PO TABS: 112.0000 ug | ORAL_TABLET | Freq: Every day | ORAL | 3 refills | Status: DC

## 2019-11-02 ENCOUNTER — Encounter: Payer: Self-pay | Admitting: Physician Assistant

## 2019-11-03 ENCOUNTER — Other Ambulatory Visit: Payer: Self-pay

## 2019-11-03 ENCOUNTER — Encounter: Payer: Self-pay | Admitting: Physician Assistant

## 2019-11-03 ENCOUNTER — Ambulatory Visit (INDEPENDENT_AMBULATORY_CARE_PROVIDER_SITE_OTHER): Payer: Managed Care, Other (non HMO) | Admitting: Physician Assistant

## 2019-11-03 VITALS — BP 144/84 | HR 99 | Temp 97.0°F | Resp 16 | Wt 148.0 lb

## 2019-11-03 DIAGNOSIS — B370 Candidal stomatitis: Secondary | ICD-10-CM | POA: Diagnosis not present

## 2019-11-03 DIAGNOSIS — F5101 Primary insomnia: Secondary | ICD-10-CM | POA: Diagnosis not present

## 2019-11-03 MED ORDER — NYSTATIN 100000 UNIT/ML MT SUSP: 5.0000 mL | Freq: Four times a day (QID) | OROMUCOSAL | 1 refills | Status: DC

## 2019-11-03 MED ORDER — TRAZODONE HCL 50 MG PO TABS: 25.0000 mg | ORAL_TABLET | Freq: Every evening | ORAL | 1 refills | Status: DC | PRN

## 2019-11-03 NOTE — Progress Notes (Signed)
Established patient visit    Patient: Jordan Baker   DOB: [birth year]   59 y.o. Female  MRN: [id number] Visit Date: 11/03/2019  Today's healthcare provider: Mar Daring, PA-C   Chief Complaint  Patient presents with  . Thrush   Subjective    HPI  Patient here with c/o possible Thrush. 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  Also having issues with sleep still. Will awaken multiple times per night and has issues falling back asleep.   Patient Active Problem List   Diagnosis Date Noted  . S/P left unicompartmental knee replacement 07/02/2017  . Chronic Arthralgia of knees (Primary Area of Pain) (Bilateral) (L>R) 02/12/2017  . Osteoarthritis of knee (Bilateral) (L>R) 01/05/2017  . Chronic knee pain (Primary Area of Pain) (Bilateral) (L>R) 12/31/2016  . Incomplete emptying of bladder 06/22/2016  . Benign essential HTN 06/10/2016  . Stable angina pectoris ([place]) 06/10/2016  . Abnormal blood sugar 06/02/2016  . Fibroid 05/31/2015  . Hypercholesterolemia 05/31/2015  . Hot flash, menopausal 05/31/2015  . GERD (gastroesophageal reflux disease) 05/15/2015  . Hypothyroidism 05/15/2015  . Anxiety disorder 05/15/2015  . Sleep apnea 05/15/2015  . Insomnia 05/15/2015  . History of neuroendocrine cancer 02/02/2013  . Bladder infection, chronic 08/10/2012  . Female genital symptoms 08/10/2012  . Increased frequency of urination 08/10/2012   Past Medical History:  Diagnosis Date  . Anxiety   . GERD (gastroesophageal reflux disease)   . Hyperlipidemia   . Hypothyroidism        Medications: Outpatient Medications Prior to Visit  Medication Sig  . ALPRAZolam (XANAX) 0.5 MG tablet TAKE 1 TABLET BY MOUTH AT BEDTIME AS NEEDED FOR ANXIETY  . amitriptyline (ELAVIL) 25 MG tablet Take 1 tablet (25 mg total) by mouth at bedtime.  Marland Kitchen amLODipine  (NORVASC) 5 MG tablet Take 1 tablet (5 mg total) by mouth daily.  . cetirizine (ZYRTEC) 10 MG tablet Take by mouth.  . cyclobenzaprine (FLEXERIL) 10 MG tablet Take 1 tablet (10 mg total) by mouth 3 (three) times daily as needed. for muscle spams  . fluticasone (FLONASE) 50 MCG/ACT nasal spray Use 2 spray(s) in each nostril once daily  . levothyroxine (SYNTHROID) 112 MCG tablet Take 1 tablet (112 mcg total) by mouth daily.  . NON FORMULARY CPAP (Device) nightly  . omeprazole (PRILOSEC) 20 MG capsule Take 1 capsule (20 mg total) by mouth daily.  . pravastatin (PRAVACHOL) 40 MG tablet Take 1 tablet (40 mg total) by mouth daily.  Marland Kitchen doxycycline (VIBRA-TABS) 100 MG tablet Take 1 tablet (100 mg total) by mouth daily. As needed (Patient not taking: Reported on 11/03/2019)   No facility-administered medications prior to visit.    Review of Systems  Last CBC Lab Results  Component Value Date   WBC 4.7 06/23/2019   HGB 12.7 06/23/2019   HCT 38.1 06/23/2019   MCV 86 06/23/2019   MCH 28.7 06/23/2019   RDW 13.2 06/23/2019   PLT 300 [id number]   Last metabolic panel Lab Results  Component Value Date   GLUCOSE 83 06/23/2019   NA 143 06/23/2019   K 4.2 06/23/2019   CL 104 06/23/2019   CO2 24 06/23/2019   BUN 11 06/23/2019   CREATININE 0.87 06/23/2019   GFRNONAA 74 06/23/2019   GFRAA 86 06/23/2019   CALCIUM 9.7 06/23/2019   PROT 6.5 06/23/2019  ALBUMIN 4.3 06/23/2019   LABGLOB 2.2 06/23/2019   AGRATIO 2.0 06/23/2019   BILITOT 0.3 06/23/2019   ALKPHOS 123 (H) 06/23/2019   AST 18 06/23/2019   ALT 15 06/23/2019   Last hemoglobin A1c Lab Results  Component Value Date   HGBA1C 5.6 06/23/2019   Last thyroid functions Lab Results  Component Value Date   TSH 0.052 (L) 09/14/2019   T4TOTAL 9.8 09/14/2019       Objective    BP (!) 144/84 (BP Location: Left Arm, Patient Position: Sitting, Cuff Size: Large)   Pulse 99   Temp (!) 97 F (36.1 C) (Temporal)   Resp 16   Wt 148 lb  (67.1 kg)   LMP 06/20/2018 Comment: no urine pregnancy needed per Dr. Amie Critchley  BMI 27.07 kg/m  BP Readings from Last 3 Encounters:  11/03/19 (!) 144/84  06/23/19 (!) 153/81  01/24/19 (!) 142/98   Wt Readings from Last 3 Encounters:  11/03/19 148 lb (67.1 kg)  06/23/19 154 lb 9.6 oz (70.1 kg)  01/24/19 159 lb (72.1 kg)      Physical Exam Vitals reviewed.  Constitutional:      General: She is not in acute distress.    Appearance: Normal appearance. She is well-developed. She is not ill-appearing or diaphoretic.  HENT:     Head: Normocephalic and atraumatic.     Mouth/Throat:     Lips: Pink. No lesions.     Mouth: Mucous membranes are dry.     Dentition: Normal dentition.     Tongue: Lesions (white lesions on erythematous base at posterior tongue) present.     Palate: No mass and lesions.     Pharynx: Oropharynx is clear. Uvula midline. Posterior oropharyngeal erythema present. No pharyngeal swelling, oropharyngeal exudate or uvula swelling.  Neck:     Thyroid: No thyromegaly.     Vascular: No JVD.     Trachea: No tracheal deviation.  Cardiovascular:     Rate and Rhythm: Normal rate and regular rhythm.     Heart sounds: Normal heart sounds. No murmur. No friction rub. No gallop.   Pulmonary:     Effort: Pulmonary effort is normal. No respiratory distress.     Breath sounds: Normal breath sounds. No wheezing or rales.  Musculoskeletal:     Cervical back: Normal range of motion and neck supple.  Neurological:     Mental Status: She is alert.       No results found for any visits on 11/03/19.   Assessment & Plan    1. Thrush Most likely from wearing N95 for 6 hours straight multiple days in a row as well as added [place] apple cider vinegar gummies which may have altered her pH slightly increasing the probability of developing yeast. Nystatin given as below.  - nystatin (MYCOSTATIN) 100000 UNIT/ML suspension; Take 5 mLs (500,000 Units total) by mouth 4 (four) times  daily. Swish and swallow  Dispense: 60 mL; Refill: 1  2. Primary insomnia Will try Trazodone as below. Call if not working or if insomnia is worsening.  - traZODone (DESYREL) 50 MG tablet; Take 0.5-1 tablets (25-50 mg total) by mouth at bedtime as needed for sleep.  Dispense: 30 tablet; Refill: 1   No follow-ups on file.      Reynolds Bowl, PA-C, have reviewed all documentation for this visit. The documentation on 11/03/19 for the exam, diagnosis, procedures, and orders are all accurate and complete.   Mar Daring, PA-C  [facility] [phone] (phone) [phone] (fax)  [place]

## 2019-11-03 NOTE — Patient Instructions (Signed)
Oral Thrush, Adult  Oral thrush, also called oral candidiasis, is a fungal infection that develops in the mouth and throat and on the tongue. It causes white patches to form on the mouth and tongue. Thrush is most common in older adults, but it can occur at any age. Many cases of thrush are mild, but this infection can also be serious. Thrush can be a repeated (recurrent) problem for certain people who have a weak body defense system (immune system). The weakness can be caused by chronic illnesses, or by taking medicines that limit the body's ability to fight infection. If a person has difficulty fighting infection, the fungus that causes thrush can spread through the body. This can cause life-threatening blood or organ infections. What are the causes? This condition is caused by a fungus (yeast) called Candida albicans.  This fungus is normally present in small amounts in the mouth and on other mucous membranes. It usually causes no harm.  If conditions are present that allow the fungus to grow without control, it invades surrounding tissues and becomes an infection.  Other Candida species can also lead to thrush (rare). What increases the risk? This condition is more likely to develop in:  People with a weakened immune system.  Older adults.  People with HIV (human immunodeficiency virus).  People with diabetes.  People with dry mouth (xerostomia).  Pregnant women.  People with poor dental care, especially people who have false teeth.  People who use antibiotic medicines. What are the signs or symptoms? Symptoms of this condition can vary from mild and moderate to severe and persistent. Symptoms may include:  A burning feeling in the mouth and throat. This can occur at the start of a thrush infection.  White patches that stick to the mouth and tongue. The tissue around the patches may be red, raw, and painful. If rubbed (during tooth brushing, for example), the patches and the  tissue of the mouth may bleed easily.  A bad taste in the mouth or difficulty tasting foods.  A cottony feeling in the mouth.  Pain during eating and swallowing.  Poor appetite.  Cracking at the corners of the mouth. How is this diagnosed? This condition is diagnosed based on:  Physical exam. Your health care provider will look in your mouth.  Health history. Your health care provider will ask you questions about your health. How is this treated? This condition is treated with medicines called antifungals, which prevent the growth of fungi. These medicines are either applied directly to the affected area (topical) or swallowed (oral). The treatment will depend on the severity of the condition. Mild thrush Mild cases of thrush may clear up with the use of an antifungal mouth rinse or lozenges. Treatment usually lasts about 14 days. Moderate to severe thrush  More severe thrush infections that have spread to the esophagus are treated with an oral antifungal medicine. A topical antifungal medicine may also be used.  For some severe infections, treatment may need to continue for more than 14 days.  Oral antifungal medicines are rarely used during pregnancy because they may be harmful to the unborn child. If you are pregnant, talk with your health care provider about options for treatment. Persistent or recurrent thrush For cases of thrush that do not go away or keep coming back:  Treatment may be needed twice as long as the symptoms last.  Treatment will include both oral and topical antifungal medicines.  People with a weakened immune system can take   an antifungal medicine on a continuous basis to prevent thrush infections. It is important to treat conditions that make a person more likely to get thrush, such as diabetes or HIV. Follow these instructions at home: Medicines  Take over-the-counter and prescription medicines only as told by your health care provider.  Talk with  your health care provider about an over-the-counter medicine called gentian violet, which kills bacteria and fungi. Relieving soreness and discomfort To help reduce the discomfort of thrush:  Drink cold liquids such as water or iced tea.  Try flavored ice treats or frozen juices.  Eat foods that are easy to swallow, such as gelatin, ice cream, or custard.  Try drinking from a straw if the patches in your mouth are painful.  General instructions  Eat plain, unflavored yogurt as directed by your health care provider. Check the label to make sure the yogurt contains live cultures. This yogurt can help healthy bacteria to grow in the mouth and can stop the growth of the fungus that causes thrush.  If you wear dentures, remove the dentures before going to bed, brush them vigorously, and soak them in a cleaning solution as directed by your health care provider.  Rinse your mouth with a warm salt-water mixture several times a day. To make a salt-water mixture, completely dissolve 1/2-1 tsp of salt in 1 cup of warm water. Contact a health care provider if:  Your symptoms are getting worse or are not improving within 7 days of starting treatment.  You have symptoms of a spreading infection, such as white patches on the skin outside of the mouth. This information is not intended to replace advice given to you by your health care provider. Make sure you discuss any questions you have with your health care provider. Document Revised: 10/02/2017 Document Reviewed: 03/24/2016 Elsevier Patient Education  [place].  Trazodone tablets What is this medicine? TRAZODONE (TRAZ oh done) is used to treat depression. This medicine may be used for other purposes; ask your health care provider or pharmacist if you have questions. COMMON BRAND NAME(S): Desyrel What should I tell my health care provider before I take this medicine? They need to know if you have any of these conditions:  attempted  suicide or thinking about it  bipolar disorder  bleeding problems  glaucoma  heart disease, or previous heart attack  irregular heart beat  kidney or liver disease  low levels of sodium in the blood  an unusual or allergic reaction to trazodone, other medicines, foods, dyes or preservatives  pregnant or trying to get pregnant  breast-feeding How should I use this medicine? Take this medicine by mouth with a glass of water. Follow the directions on the prescription label. Take this medicine shortly after a meal or a light snack. Take your medicine at regular intervals. Do not take your medicine more often than directed. Do not stop taking this medicine suddenly except upon the advice of your doctor. Stopping this medicine too quickly may cause serious side effects or your condition may worsen. A special MedGuide will be given to you by the pharmacist with each prescription and refill. Be sure to read this information carefully each time. Talk to your pediatrician regarding the use of this medicine in children. Special care may be needed. Overdosage: If you think you have taken too much of this medicine contact a poison control center or emergency room at once. NOTE: This medicine is only for you. Do not share this medicine with  others. What if I miss a dose? If you miss a dose, take it as soon as you can. If it is almost time for your next dose, take only that dose. Do not take double or extra doses. What may interact with this medicine? Do not take this medicine with any of the following medications:  certain medicines for fungal infections like fluconazole, itraconazole, ketoconazole, posaconazole, voriconazole  cisapride  dronedarone  linezolid  MAOIs like Carbex, Eldepryl, Marplan, Nardil, and Parnate  mesoridazine  methylene blue (injected into a vein)  pimozide  saquinavir  thioridazine This medicine may also interact with the following  medications:  alcohol  antiviral medicines for HIV or AIDS  aspirin and aspirin-like medicines  barbiturates like phenobarbital  certain medicines for blood pressure, heart disease, irregular heart beat  certain medicines for depression, anxiety, or psychotic disturbances  certain medicines for migraine headache like almotriptan, eletriptan, frovatriptan, naratriptan, rizatriptan, sumatriptan, zolmitriptan  certain medicines for seizures like carbamazepine and phenytoin  certain medicines for sleep  certain medicines that treat or prevent blood clots like dalteparin, enoxaparin, warfarin  digoxin  fentanyl  lithium  NSAIDS, medicines for pain and inflammation, like ibuprofen or naproxen  other medicines that prolong the QT interval (cause an abnormal heart rhythm) like dofetilide  rasagiline  supplements like St. John's wort, kava kava, valerian  tramadol  tryptophan This list may not describe all possible interactions. Give your health care provider a list of all the medicines, herbs, non-prescription drugs, or dietary supplements you use. Also tell them if you smoke, drink alcohol, or use illegal drugs. Some items may interact with your medicine. What should I watch for while using this medicine? Tell your doctor if your symptoms do not get better or if they get worse. Visit your doctor or health care professional for regular checks on your progress. Because it may take several weeks to see the full effects of this medicine, it is important to continue your treatment as prescribed by your doctor. Patients and their families should watch out for new or worsening thoughts of suicide or depression. Also watch out for sudden changes in feelings such as feeling anxious, agitated, panicky, irritable, hostile, aggressive, impulsive, severely restless, overly excited and hyperactive, or not being able to sleep. If this happens, especially at the beginning of treatment or after a  change in dose, call your health care professional. Dennis Bast may get drowsy or dizzy. Do not drive, use machinery, or do anything that needs mental alertness until you know how this medicine affects you. Do not stand or sit up quickly, especially if you are an older patient. This reduces the risk of dizzy or fainting spells. Alcohol may interfere with the effect of this medicine. Avoid alcoholic drinks. This medicine may cause dry eyes and blurred vision. If you wear contact lenses you may feel some discomfort. Lubricating drops may help. See your eye doctor if the problem does not go away or is severe. Your mouth may get dry. Chewing sugarless gum, sucking hard candy and drinking plenty of water may help. Contact your doctor if the problem does not go away or is severe. What side effects may I notice from receiving this medicine? Side effects that you should report to your doctor or health care professional as soon as possible:  allergic reactions like skin rash, itching or hives, swelling of the face, lips, or tongue  elevated mood, decreased need for sleep, racing thoughts, impulsive behavior  confusion  fast, irregular heartbeat  feeling faint or lightheaded, falls  feeling agitated, angry, or irritable  loss of balance or coordination  painful or prolonged erections  restlessness, pacing, inability to keep still  suicidal thoughts or other mood changes  tremors  trouble sleeping  seizures  unusual bleeding or bruising Side effects that usually do not require medical attention (report to your doctor or health care professional if they continue or are bothersome):  change in sex drive or performance  change in appetite or weight  constipation  headache  muscle aches or pains  nausea This list may not describe all possible side effects. Call your doctor for medical advice about side effects. You may report side effects to FDA at 1-800-FDA-1088. Where should I keep my  medicine? Keep out of the reach of children. Store at room temperature between 15 and 30 degrees C (59 to 86 degrees F). Protect from light. Keep container tightly closed. Throw away any unused medicine after the expiration date. NOTE: This sheet is a summary. It may not cover all possible information. If you have questions about this medicine, talk to your doctor, pharmacist, or health care provider.  2020 Elsevier/Gold Standard (2018-06-22 11:46:46)

## 2019-11-18 ENCOUNTER — Encounter: Payer: Self-pay | Admitting: Physician Assistant

## 2019-12-16 ENCOUNTER — Other Ambulatory Visit: Payer: Self-pay | Admitting: Physician Assistant

## 2019-12-16 DIAGNOSIS — F411 Generalized anxiety disorder: Secondary | ICD-10-CM

## 2019-12-16 NOTE — Telephone Encounter (Signed)
Requested medication (s) are due for refill today: yes  Requested medication (s) are on the active medication list: yes  Last refill:  09/18/2019  Future visit scheduled: no  Notes to clinic: this refill cannot be delegated    Requested Prescriptions  Pending Prescriptions Disp Refills   ALPRAZolam (XANAX) 0.5 MG tablet [Pharmacy Med Name: ALPRAZolam 0.5 MG Oral Tablet] 90 tablet 0    Sig: TAKE 1 TABLET BY MOUTH AT BEDTIME AS NEEDED FOR ANXIETY      Not Delegated - Psychiatry:  Anxiolytics/Hypnotics Failed - 12/16/2019 11:17 AM      Failed - This refill cannot be delegated      Failed - Urine Drug Screen completed in last 360 days.      Passed - Valid encounter within last 6 months    Recent Outpatient Visits           1 month ago [facility], Clearnce Sorrel, [place]   5 months ago Annual physical exam   [facility] [place] Malling M, [place]   10 months ago [place], [place]   1 year ago Acute non-recurrent pansinusitis   [facility] [place], [place]ce Sorrel, [place]   1 year ago Annual physical exam   [facility] Mar Daring, [place]

## 2020-01-09 LAB — HM COLONOSCOPY

## 2020-02-21 NOTE — Telephone Encounter (Signed)
error 

## 2020-03-03 IMAGING — MG DIGITAL SCREENING BILATERAL MAMMOGRAM WITH TOMO AND CAD
8 series · 8 of 24 positions shown · non-contrast
Comparison: Previous exam(s).

CLINICAL DATA: Screening.

EXAM:
DIGITAL SCREENING BILATERAL MAMMOGRAM WITH TOMO AND CAD

[L MLO synth-2D]
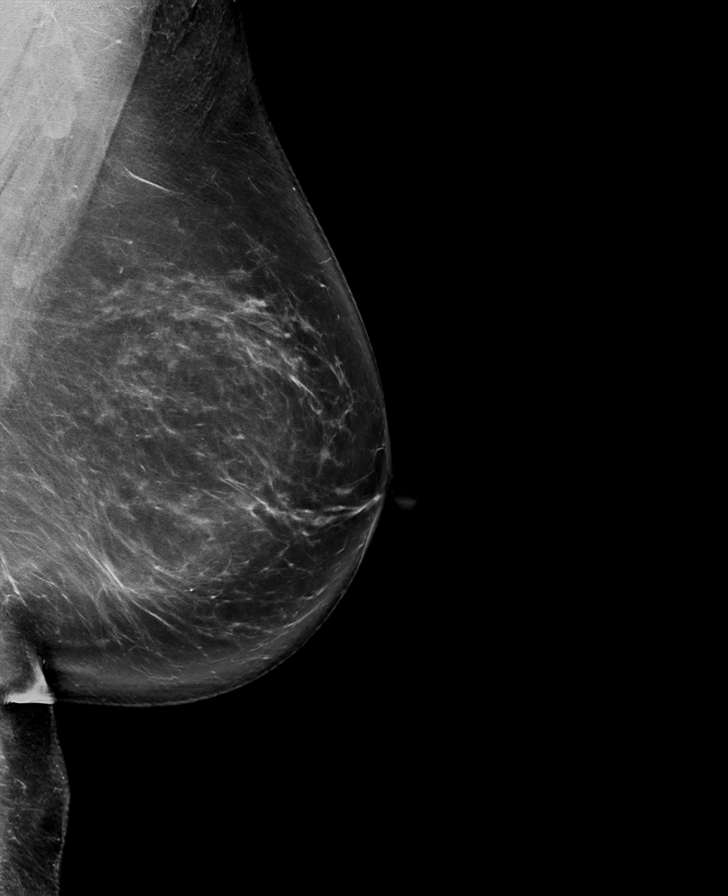

[R CC synth-2D]
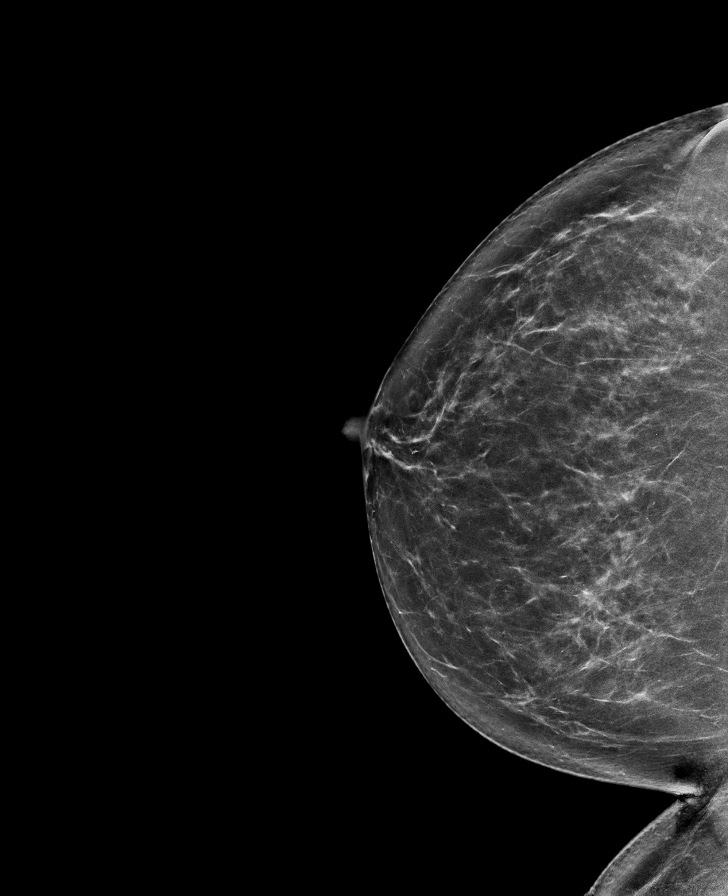

[L CC synth-2D]
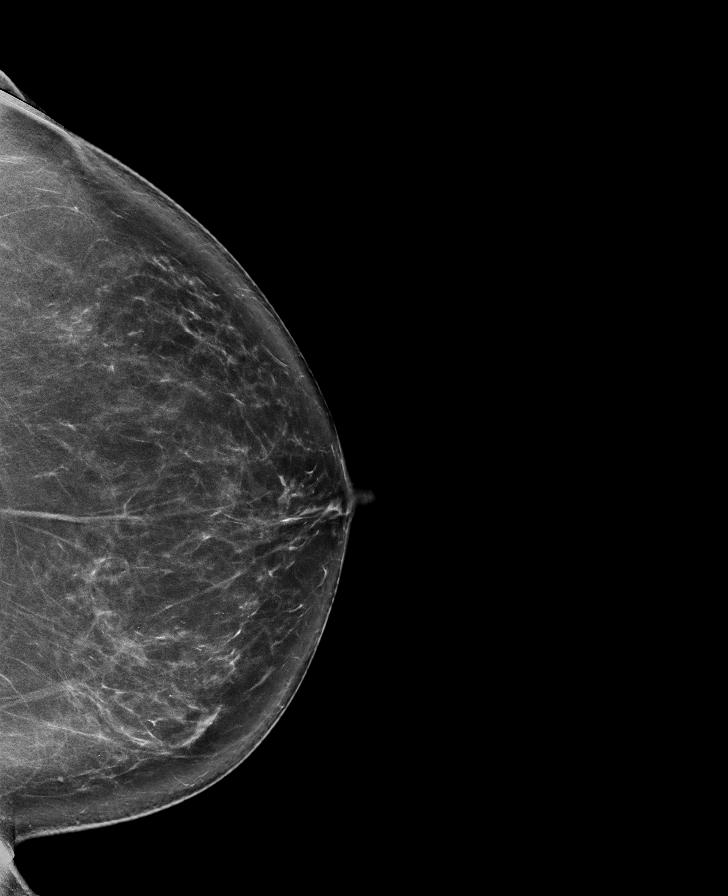

[R MLO synth-2D]
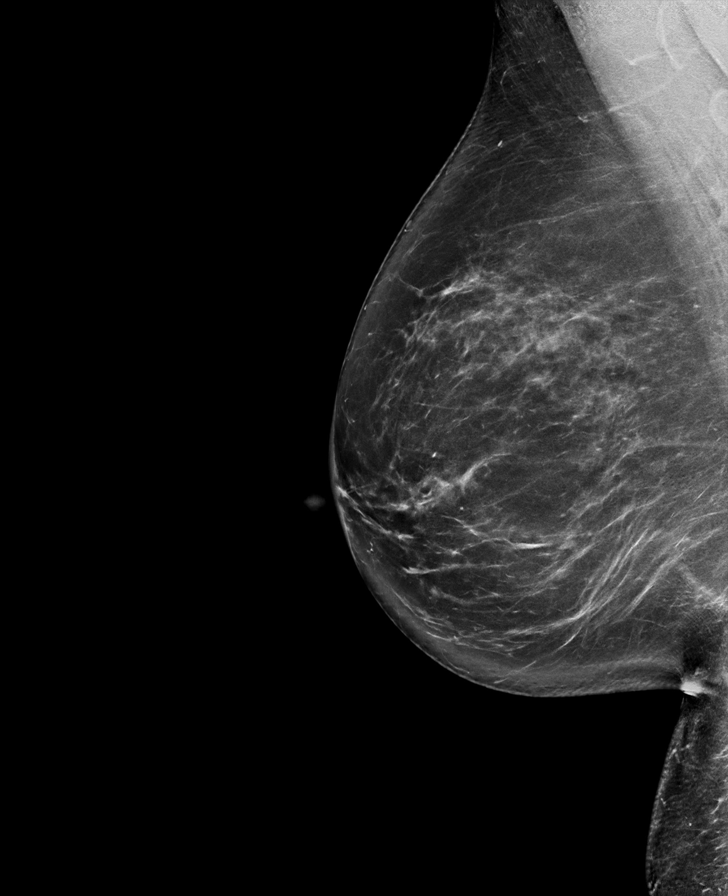

[L CC tomo · tomo slice 43/84.0]
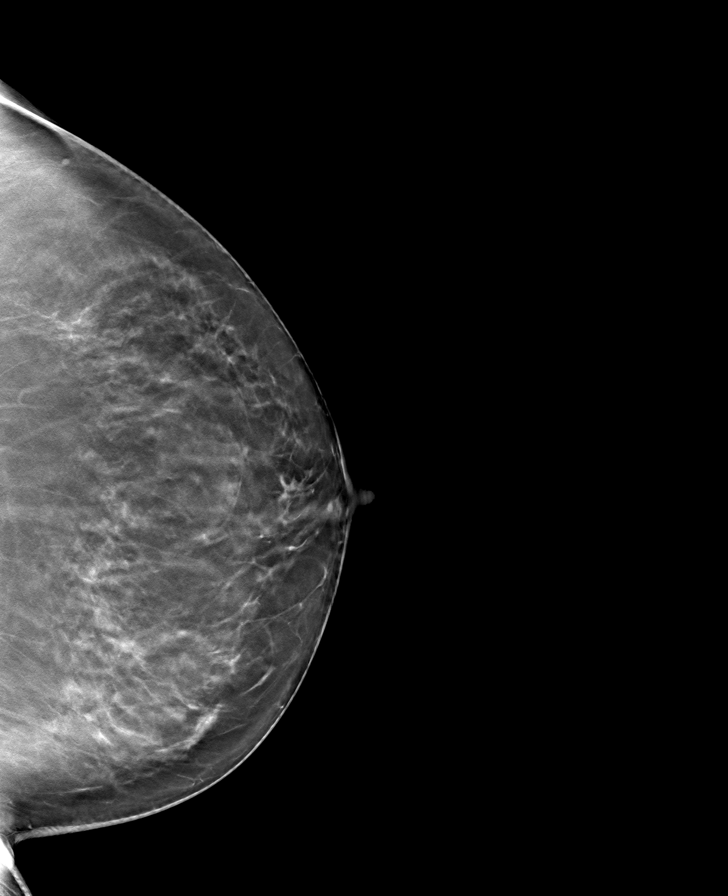

[L MLO tomo · tomo slice 47/94.0]
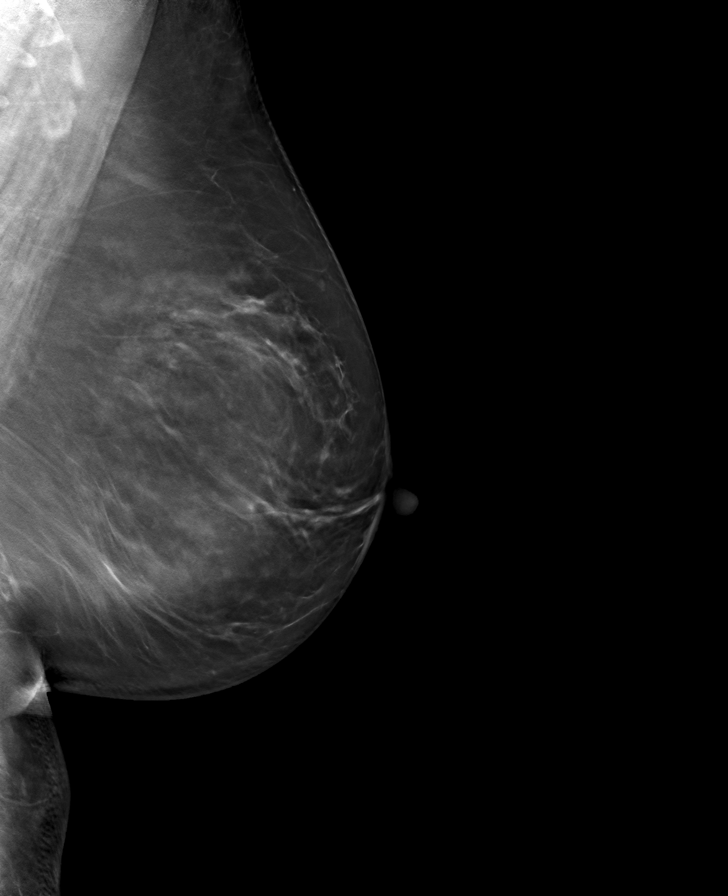

[R CC tomo · tomo slice 38/75.0]
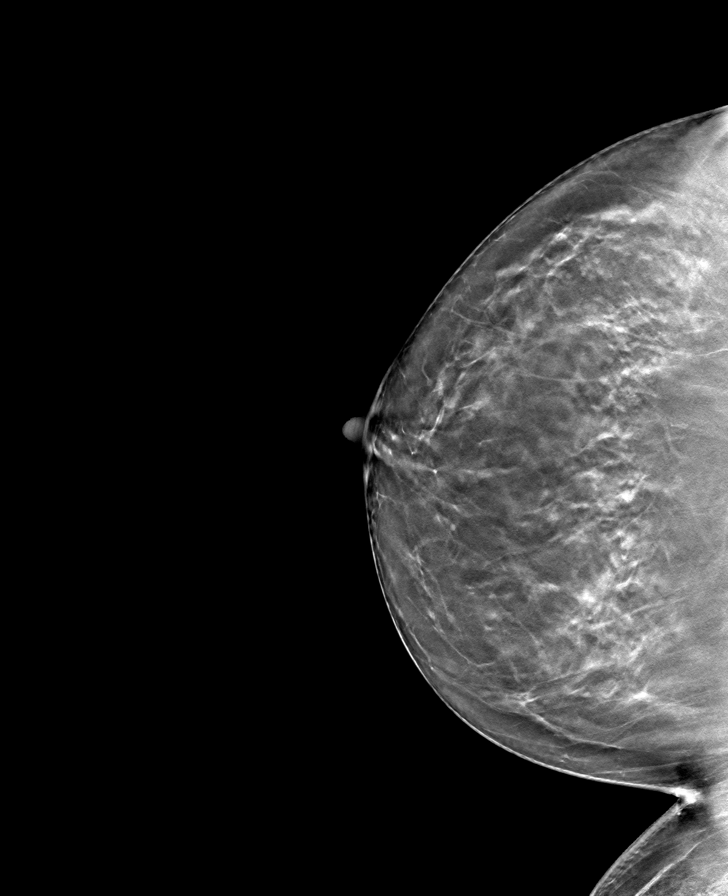

[R MLO tomo · tomo slice 44/87.0]
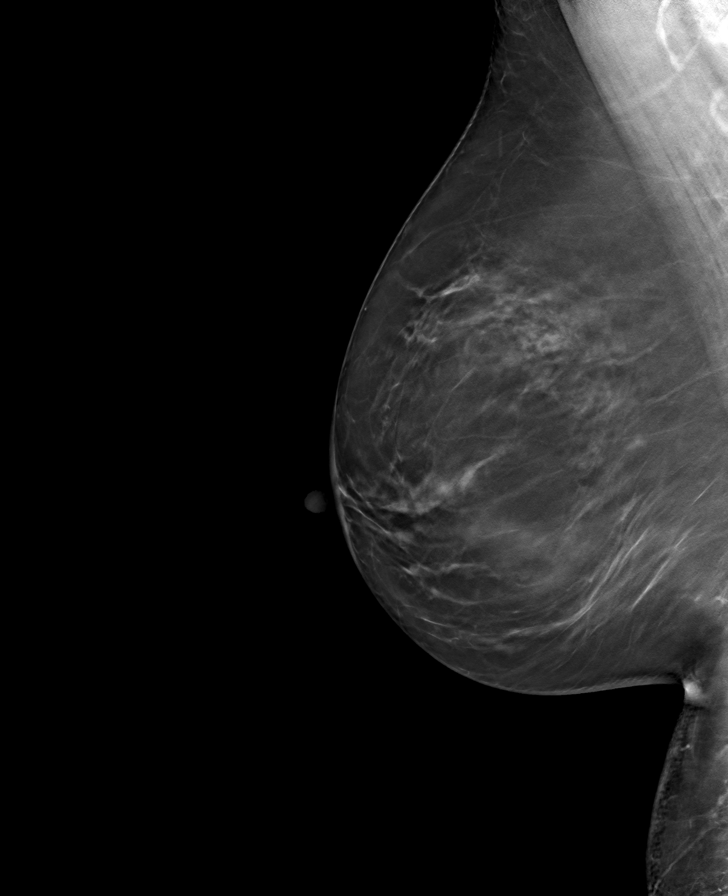

[8 of 24 positions shown; findings below may reference images not displayed]

ACR Breast Density Category b: There are scattered areas of
fibroglandular density.
FINDINGS: There are no findings suspicious for malignancy. Images were
processed with CAD.
IMPRESSION: No mammographic evidence of malignancy. A result letter of this
screening mammogram will be mailed directly to the patient.

RECOMMENDATION:
Screening mammogram in one year. (Code:[id number])

BI-RADS CATEGORY  1: Negative.

## 2020-03-20 ENCOUNTER — Other Ambulatory Visit: Payer: Self-pay | Admitting: Physician Assistant

## 2020-03-20 DIAGNOSIS — M792 Neuralgia and neuritis, unspecified: Secondary | ICD-10-CM

## 2020-03-21 ENCOUNTER — Other Ambulatory Visit: Payer: Self-pay | Admitting: Physician Assistant

## 2020-03-21 DIAGNOSIS — N390 Urinary tract infection, site not specified: Secondary | ICD-10-CM

## 2020-05-02 ENCOUNTER — Other Ambulatory Visit: Payer: Self-pay | Admitting: Physician Assistant

## 2020-05-02 DIAGNOSIS — Z1231 Encounter for screening mammogram for malignant neoplasm of breast: Secondary | ICD-10-CM

## 2020-05-21 ENCOUNTER — Other Ambulatory Visit: Payer: Self-pay

## 2020-05-21 ENCOUNTER — Encounter: Payer: Self-pay | Admitting: Physician Assistant

## 2020-05-21 ENCOUNTER — Ambulatory Visit (INDEPENDENT_AMBULATORY_CARE_PROVIDER_SITE_OTHER): Payer: Managed Care, Other (non HMO) | Admitting: Physician Assistant

## 2020-05-21 VITALS — BP 139/83 | HR 109 | Resp 16 | Wt 139.8 lb

## 2020-05-21 DIAGNOSIS — K7689 Other specified diseases of liver: Secondary | ICD-10-CM | POA: Diagnosis not present

## 2020-05-21 DIAGNOSIS — E034 Atrophy of thyroid (acquired): Secondary | ICD-10-CM

## 2020-05-21 DIAGNOSIS — K2900 Acute gastritis without bleeding: Secondary | ICD-10-CM | POA: Diagnosis not present

## 2020-05-21 DIAGNOSIS — K5904 Chronic idiopathic constipation: Secondary | ICD-10-CM

## 2020-05-21 DIAGNOSIS — M792 Neuralgia and neuritis, unspecified: Secondary | ICD-10-CM

## 2020-05-21 MED ORDER — AMITRIPTYLINE HCL 25 MG PO TABS: 50.0000 mg | ORAL_TABLET | Freq: Every day | ORAL | 0 refills | Status: DC

## 2020-05-21 MED ORDER — SUCRALFATE 1 G PO TABS: 1.0000 g | ORAL_TABLET | Freq: Three times a day (TID) | ORAL | 1 refills | Status: DC

## 2020-05-21 NOTE — Progress Notes (Signed)
Established patient visit   Patient: Jordan Baker   DOB: 1960/09/28   59 y.o. Female  MRN: [id number] Visit Date: 05/21/2020  Today's healthcare provider: Mar Daring, PA-C   Chief Complaint  Patient presents with   GI Problem   Subjective    HPI  Patient reports that her stomach feels worse even after taking her Omeprazole 20mg . Reports that she has talked about this before on how the burning start in her mouth down to her stomach. She would like a referral to GI.  Had her influenza vaccine through [facility].  Patient Active Problem List   Diagnosis Date Noted   S/P left unicompartmental knee replacement 07/02/2017   Chronic Arthralgia of knees (Primary Area of Pain) (Bilateral) (L>R) 02/12/2017   Osteoarthritis of knee (Bilateral) (L>R) 01/05/2017   Chronic knee pain (Primary Area of Pain) (Bilateral) (L>R) 12/31/2016   Incomplete emptying of bladder 06/22/2016   Benign essential HTN 06/10/2016   Stable angina pectoris ([place]) 06/10/2016   Abnormal blood sugar 06/02/2016   Fibroid 05/31/2015   Hypercholesterolemia 05/31/2015   Hot flash, menopausal 05/31/2015   GERD (gastroesophageal reflux disease) 05/15/2015   Hypothyroidism 05/15/2015   Anxiety disorder 05/15/2015   Sleep apnea 05/15/2015   Insomnia 05/15/2015   History of neuroendocrine cancer 02/02/2013   Bladder infection, chronic 08/10/2012   Female genital symptoms 08/10/2012   Increased frequency of urination 08/10/2012   Past Medical History:  Diagnosis Date   Anxiety    GERD (gastroesophageal reflux disease)    Hyperlipidemia    Hypothyroidism        Medications: Outpatient Medications Prior to Visit  Medication Sig   APPLE CIDER VINEGAR PO Take by mouth. gummies   diclofenac Sodium (VOLTAREN) 1 % GEL    Lactobacillus-Inulin (PROBIOTIC DIGESTIVE SUPPORT PO) Take by mouth. Raw probiotic Probiotic and preprobiotic   NON FORMULARY CBD oil    ALPRAZolam (XANAX) 0.5 MG tablet TAKE 1 TABLET BY MOUTH AT BEDTIME AS NEEDED FOR ANXIETY   amLODipine (NORVASC) 5 MG tablet Take 1 tablet (5 mg total) by mouth daily.   cetirizine (ZYRTEC) 10 MG tablet Take by mouth.   cyclobenzaprine (FLEXERIL) 10 MG tablet Take 1 tablet (10 mg total) by mouth 3 (three) times daily as needed. for muscle spams   fluticasone (FLONASE) 50 MCG/ACT nasal spray Use 2 spray(s) in each nostril once daily   NON FORMULARY CPAP (Device) nightly   pravastatin (PRAVACHOL) 40 MG tablet Take 1 tablet (40 mg total) by mouth daily.   [DISCONTINUED] amitriptyline (ELAVIL) 25 MG tablet TAKE 1 TABLET BY MOUTH AT BEDTIME   [DISCONTINUED] levothyroxine (SYNTHROID) 112 MCG tablet Take 1 tablet (112 mcg total) by mouth daily.   [DISCONTINUED] nystatin (MYCOSTATIN) 100000 UNIT/ML suspension Take 5 mLs (500,000 Units total) by mouth 4 (four) times daily. Swish and swallow   [DISCONTINUED] omeprazole (PRILOSEC) 20 MG capsule Take 1 capsule (20 mg total) by mouth daily.   [DISCONTINUED] traZODone (DESYREL) 50 MG tablet Take 0.5-1 tablets (25-50 mg total) by mouth at bedtime as needed for sleep.   No facility-administered medications prior to visit.    Review of Systems  Constitutional: Negative.   Respiratory: Negative.   Cardiovascular: Negative.   Gastrointestinal: Positive for abdominal distention, abdominal pain, constipation and nausea.  Neurological: Negative.     Last CBC Lab Results  Component Value Date   WBC 4.7 06/23/2019   HGB 12.7 06/23/2019   HCT 38.1 06/23/2019   MCV 86  06/23/2019   MCH 28.7 06/23/2019   RDW 13.2 06/23/2019   PLT 300 16/04/9603   Last metabolic panel Lab Results  Component Value Date   GLUCOSE 83 06/23/2019   NA 143 06/23/2019   K 4.2 06/23/2019   CL 104 06/23/2019   CO2 24 06/23/2019   BUN 11 06/23/2019   CREATININE 0.87 06/23/2019   GFRNONAA 74 06/23/2019   GFRAA 86 06/23/2019   CALCIUM 9.7 06/23/2019   PROT 6.8  05/22/2020   ALBUMIN 4.5 05/22/2020   LABGLOB 2.2 06/23/2019   AGRATIO 2.0 06/23/2019   BILITOT 0.2 05/22/2020   ALKPHOS 95 05/22/2020   AST 12 05/22/2020   ALT 9 05/22/2020      Objective    BP 139/83 (BP Location: Right Arm, Patient Position: Sitting, Cuff Size: Normal)    Pulse (!) 109    Resp 16    Wt 139 lb 12.8 oz (63.4 kg)    LMP 06/20/2018 Comment: no urine pregnancy needed per Dr. Amie Critchley   BMI 25.57 kg/m  BP Readings from Last 3 Encounters:  05/21/20 139/83  11/03/19 (!) 144/84  06/23/19 (!) 153/81   Wt Readings from Last 3 Encounters:  05/21/20 139 lb 12.8 oz (63.4 kg)  11/03/19 148 lb (67.1 kg)  06/23/19 154 lb 9.6 oz (70.1 kg)      Physical Exam Vitals reviewed.  Constitutional:      General: She is not in acute distress.    Appearance: Normal appearance. She is well-developed, well-groomed and overweight. She is not diaphoretic.  Cardiovascular:     Rate and Rhythm: Normal rate and regular rhythm.     Heart sounds: Normal heart sounds. No murmur heard.  No friction rub. No gallop.   Pulmonary:     Effort: Pulmonary effort is normal. No respiratory distress.     Breath sounds: Normal breath sounds. No wheezing or rales.  Abdominal:     General: Abdomen is flat. Bowel sounds are normal. There is no distension.     Palpations: Abdomen is soft. There is no mass.     Tenderness: There is generalized abdominal tenderness and tenderness in the epigastric area. There is no guarding or rebound.  Skin:    General: Skin is warm and dry.  Neurological:     Mental Status: She is alert.  Psychiatric:        Behavior: Behavior is cooperative.      Results for orders placed or performed in visit on 05/21/20  T4 AND TSH  Result Value Ref Range   TSH 0.168 (L) 0.450 - 4.500 uIU/mL   T4, Total 9.1 4.5 - 12.0 ug/dL  Hepatic function panel  Result Value Ref Range   Total Protein 6.8 6.0 - 8.5 g/dL   Albumin 4.5 3.8 - 4.9 g/dL   Bilirubin Total 0.2 0.0 - 1.2  mg/dL   Bilirubin, Direct <0.10 0.00 - 0.40 mg/dL   Alkaline Phosphatase 95 44 - 121 IU/L   AST 12 0 - 40 IU/L   ALT 9 0 - 32 IU/L    Assessment & Plan     1. Other acute gastritis without hemorrhage Suspect PUD vs gastritis. Will stop omeprazole and change to Sucralfate as below. GI referral placed for further evaluation and consideration of EGD.  - Ambulatory referral to Gastroenterology - sucralfate (CARAFATE) 1 g tablet; Take 1 tablet (1 g total) by mouth 4 (four) times daily -  with meals and at bedtime.  Dispense: 120 tablet; Refill: 1  2. Chronic idiopathic constipation Had not improved on Linzess 75mcg. Will give samples of 159mcg and 290 mcg. Patient is to call if either dose works and will send in Rx.   3. Hypothyroidism due to acquired atrophy of thyroid Stable. Diagnosis pulled for medication refill. Continue current medical treatment plan. Will check labs as below and f/u pending results. - T4 AND TSH - levothyroxine (SYNTHROID) 100 MCG tablet; Take 1 tablet (100 mcg total) by mouth daily.  Dispense: 90 tablet; Refill: 3  4. Liver cyst H/O this. Will check labs as below and f/u pending results. - Hepatic function panel  5. Nerve pain Stable. Diagnosis pulled for medication refill. Continue current medical treatment plan. - amitriptyline (ELAVIL) 25 MG tablet; Take 2 tablets (50 mg total) by mouth at bedtime.  Dispense: 180 tablet; Refill: 0   Return if symptoms worsen or fail to improve.      Reynolds Bowl, PA-C, have reviewed all documentation for this visit. The documentation on 05/28/20 for the exam, diagnosis, procedures, and orders are all accurate and complete.   Rubye Beach  [facility] [phone] (phone) [phone] (fax)  [place]

## 2020-05-21 NOTE — Patient Instructions (Signed)
Sucralfate tablets What is this medicine? SUCRALFATE (SOO kral fate) helps to treat ulcers of the intestine. This medicine may be used for other purposes; ask your health care provider or pharmacist if you have questions. COMMON BRAND NAME(S): Carafate What should I tell my health care provider before I take this medicine? They need to know if you have any of these conditions:  kidney disease  an unusual or allergic reaction to sucralfate, other medicines, foods, dyes, or preservatives  pregnant or trying to get pregnant  breast-feeding How should I use this medicine? Take this medicine by mouth with a glass of water. Follow the directions on the prescription label. This medicine works best if you take it on an empty stomach, 1 hour before meals. Take your doses at regular intervals. Do not take your medicine more often than directed. Do not stop taking except on your doctor's advice. Talk to your pediatrician regarding the use of this medicine in children. Special care may be needed. Overdosage: If you think you have taken too much of this medicine contact a poison control center or emergency room at once. NOTE: This medicine is only for you. Do not share this medicine with others. What if I miss a dose? If you miss a dose, take it as soon as you can. If it is almost time for your next dose, take only that dose. Do not take double or extra doses. What may interact with this medicine?  antacid  cimetidine  digoxin  ketoconazole  phenytoin  quinidine  ranitidine  some antibiotics like ciprofloxacin, norfloxacin, and ofloxacin  theophylline  thyroid hormones  warfarin This list may not describe all possible interactions. Give your health care provider a list of all the medicines, herbs, non-prescription drugs, or dietary supplements you use. Also tell them if you smoke, drink alcohol, or use illegal drugs. Some items may interact with your medicine. What should I watch for  while using this medicine? Visit your doctor or health care professional for regular check ups. Let your doctor know if your symptoms do not improve or if you feel worse. Antacids should not be taken within one half hour before or after this medicine. What side effects may I notice from receiving this medicine? Side effects that you should report to your doctor or health care professional as soon as possible:  allergic reactions like skin rash, itching or hives, swelling of the face, lips, or tongue  difficulty breathing Side effects that usually do not require medical attention (report to your doctor or health care professional if they continue or are bothersome):  back pain  constipation  drowsy, dizzy  dry mouth  headache  stomach upset, gas  trouble sleeping This list may not describe all possible side effects. Call your doctor for medical advice about side effects. You may report side effects to FDA at 1-800-FDA-1088. Where should I keep my medicine? Keep out of the reach of children. Store at room temperature between 15 and 30 degrees C (59 and 86 degrees F). Keep container tightly closed. Throw away any unused medicine after the expiration date. NOTE: This sheet is a summary. It may not cover all possible information. If you have questions about this medicine, talk to your doctor, pharmacist, or health care provider.  2020 Elsevier/Gold Standard (2008-03-01 15:46:20)

## 2020-05-23 LAB — HEPATIC FUNCTION PANEL
ALT: 9 IU/L (ref 0–32)
AST: 12 IU/L (ref 0–40)
Albumin: 4.5 g/dL (ref 3.8–4.9)
Alkaline Phosphatase: 95 IU/L (ref 44–121)
Bilirubin Total: 0.2 mg/dL (ref 0.0–1.2)
Bilirubin, Direct: <0.1 mg/dL (ref 0.00–0.40)
Total Protein: 6.8 g/dL (ref 6.0–8.5)

## 2020-05-23 LAB — T4 AND TSH
T4, Total: 9.1 ug/dL (ref 4.5–12.0)
TSH: 0.168 u[IU]/mL — ABNORMAL LOW (ref 0.450–4.500)

## 2020-05-23 MED ORDER — LEVOTHYROXINE SODIUM 100 MCG PO TABS: 100.0000 ug | ORAL_TABLET | Freq: Every day | ORAL | 3 refills | Status: DC

## 2020-05-28 ENCOUNTER — Encounter: Payer: Self-pay | Admitting: Physician Assistant

## 2020-05-28 DIAGNOSIS — K5904 Chronic idiopathic constipation: Secondary | ICD-10-CM

## 2020-05-28 MED ORDER — LINACLOTIDE 290 MCG PO CAPS: 290.0000 ug | ORAL_CAPSULE | Freq: Every day | ORAL | 1 refills | Status: DC

## 2020-05-30 ENCOUNTER — Telehealth: Payer: Self-pay

## 2020-05-30 NOTE — Telephone Encounter (Signed)
Did you call her?

## 2020-05-30 NOTE — Telephone Encounter (Signed)
Copied from [id number]. Topic: Quick Communication - See Telephone Encounter >> May 30, 2020  4:05 PM Loma Boston wrote: CRM for notification. See Telephone encounter for: 05/30/20. Pt called saying  the office her work looking for her, nothing in file, stated if Tawanna Sat had called just to call back [phone]

## 2020-05-30 NOTE — Telephone Encounter (Signed)
No I didn't call

## 2020-06-16 ENCOUNTER — Other Ambulatory Visit: Payer: Self-pay | Admitting: Dermatology

## 2020-06-26 NOTE — Progress Notes (Signed)
Complete physical exam   Patient: Leonela Kivi   DOB: December 18, 1960   59 y.o. Female  MRN: [id number] Visit Date: 06/27/2020  Today's healthcare provider: Mar Daring, PA-C   Chief Complaint  Patient presents with  . Annual Exam   Subjective    Diora Bellizzi is a 59 y.o. female who presents today for a complete physical exam.  She reports consuming a well balanced diet. The patient does not participate in regular exercise at present. She generally feels fairly well. She reports sleeping fairly well. She does have additional problems to discuss today.   Would like to be refer to the allergist. Having multiple food sensitivities and prefers to be tested.   HPI  06/23/19-Pap is normal, HPV negative. Will repeat in 5 years. 07/20/2020-mammogram Scheduled. 06/25/18-Colonoscopy done. Repeat in 5 yrs.  Past Medical History:  Diagnosis Date  . Anxiety   . GERD (gastroesophageal reflux disease)   . Hyperlipidemia   . Hypothyroidism    Past Surgical History:  Procedure Laterality Date  . BIOPSY RECTAL    . CESAREAN SECTION  1990/1993   x2  . COLONOSCOPY WITH PROPOFOL N/A 06/25/2018   Procedure: COLONOSCOPY WITH PROPOFOL;  Surgeon: [name], MD;  Location: [facility] ENDOSCOPY;  Service: Gastroenterology;  Laterality: N/A;  . ESOPHAGOGASTRODUODENOSCOPY (EGD) WITH PROPOFOL N/A 06/25/2018   Procedure: ESOPHAGOGASTRODUODENOSCOPY (EGD) WITH PROPOFOL;  Surgeon: [name], MD;  Location: [facility] ENDOSCOPY;  Service: Gastroenterology;  Laterality: N/A;  . EUS N/A 05/17/2015   Procedure: LOWER ENDOSCOPIC ULTRASOUND (EUS);  Surgeon: Holly Bodily, MD;  Location: [facility] ENDOSCOPY;  Service: Gastroenterology;  Laterality: N/A;  . JOINT REPLACEMENT Left 06/2017  . OVARIAN CYST SURGERY  1993  . partial knee replacemt Left 06/24/2017   Social History   Socioeconomic History  . Marital status: Married    Spouse name: Not on file  . Number of children: 2  . Years of  education: Not on file  . Highest education level: Not on file  Occupational History    Employer: [employer]  Tobacco Use  . Smoking status: Never Smoker  . Smokeless tobacco: Never Used  Vaping Use  . Vaping Use: Never used  Substance and Sexual Activity  . Alcohol use: Yes    Alcohol/week: 0.0 - 1.0 standard drinks    Comment: Occasional  . Drug use: No  . Sexual activity: Not on file  Other Topics Concern  . Not on file  Social History Narrative  . Not on file   Social Determinants of Health   Financial Resource Strain: Not on file  Food Insecurity: Not on file  Transportation Needs: Not on file  Physical Activity: Not on file  Stress: Not on file  Social Connections: Not on file  Intimate Partner Violence: Not on file   Family Status  Relation Name Status  . PGM  Deceased at age 26       died from old age  . Mother  Alive  . Father  Deceased at age 79       Lung cancer  . Sister 1 Alive  . MGM  Deceased at age 58       died from old age  . MGF  Deceased at age 57's       died from congestiv heart failure.  Marland Kitchen PGF  Deceased at age 50       died from old age  . Sister 2 Alive  . Son  Alive  . Son  Alive   Family History  Problem Relation Age of Onset  . Breast cancer Paternal Grandmother   . Cancer Paternal Grandmother        Breast Cancer  . Hyperlipidemia Mother   . Hypertension Mother   . Hypertension Sister   . Arthritis Maternal Grandfather    Allergies  Allergen Reactions  . Augmentin [Amoxicillin-Pot Clavulanate] Rash  . Nitrofurantoin Monohyd Macro Rash    Patient Care Team: Rubye Beach as PCP - General (Family Medicine)   Medications: Outpatient Medications Prior to Visit  Medication Sig  . ALPRAZolam (XANAX) 0.5 MG tablet TAKE 1 TABLET BY MOUTH AT BEDTIME AS NEEDED FOR ANXIETY  . amitriptyline (ELAVIL) 25 MG tablet Take 2 tablets (50 mg total) by mouth at bedtime.  Marland Kitchen amLODipine (NORVASC) 5 MG tablet  Take 1 tablet (5 mg total) by mouth daily.  . APPLE CIDER VINEGAR PO Take by mouth. gummies  . cetirizine (ZYRTEC) 10 MG tablet Take by mouth.  . clobetasol (TEMOVATE) 0.05 % external solution APPLY AS DIRECTED ONCE TO TWICE DAILY AS NEEDED. AVOID FACE, GROIN, AND AXILLA  . cyclobenzaprine (FLEXERIL) 10 MG tablet Take 1 tablet (10 mg total) by mouth 3 (three) times daily as needed. for muscle spams  . diclofenac Sodium (VOLTAREN) 1 % GEL   . DOXYCYCLINE HYCLATE PO Take 100 mg by mouth.  . fluticasone (FLONASE) 50 MCG/ACT nasal spray Use 2 spray(s) in each nostril once daily  . Lactobacillus-Inulin (PROBIOTIC DIGESTIVE SUPPORT PO) Take by mouth. Raw probiotic Probiotic and preprobiotic  . levothyroxine (SYNTHROID) 100 MCG tablet Take 1 tablet (100 mcg total) by mouth daily.  Marland Kitchen linaclotide (LINZESS) 290 MCG CAPS capsule Take 1 capsule (290 mcg total) by mouth daily before breakfast.  . NON FORMULARY CPAP (Device) nightly  . NON FORMULARY CBD oil  . pravastatin (PRAVACHOL) 40 MG tablet Take 1 tablet (40 mg total) by mouth daily.  . sucralfate (CARAFATE) 1 g tablet Take 1 tablet (1 g total) by mouth 4 (four) times daily -  with meals and at bedtime.   No facility-administered medications prior to visit.    Review of Systems  Constitutional: Negative.   HENT: Positive for voice change.   Eyes: Negative.   Respiratory: Negative.   Cardiovascular: Negative.   Gastrointestinal: Positive for constipation.  Endocrine: Negative.   Genitourinary: Negative.   Musculoskeletal: Positive for arthralgias and back pain.  Skin: Negative.   Allergic/Immunologic: Positive for food allergies.  Neurological: Negative.   Hematological: Negative.   Psychiatric/Behavioral: Positive for sleep disturbance.    Last CBC Lab Results  Component Value Date   WBC 4.7 06/23/2019   HGB 12.7 06/23/2019   HCT 38.1 06/23/2019   MCV 86 06/23/2019   MCH 28.7 06/23/2019   RDW 13.2 06/23/2019   PLT 300 78/67/6720    Last metabolic panel Lab Results  Component Value Date   GLUCOSE 83 06/23/2019   NA 143 06/23/2019   K 4.2 06/23/2019   CL 104 06/23/2019   CO2 24 06/23/2019   BUN 11 06/23/2019   CREATININE 0.87 06/23/2019   GFRNONAA 74 06/23/2019   GFRAA 86 06/23/2019   CALCIUM 9.7 06/23/2019   PROT 6.8 05/22/2020   ALBUMIN 4.5 05/22/2020   LABGLOB 2.2 06/23/2019   AGRATIO 2.0 06/23/2019   BILITOT 0.2 05/22/2020   ALKPHOS 95 05/22/2020   AST 12 05/22/2020   ALT 9 05/22/2020      Objective  BP (!) 150/56 (BP Location: Left Arm, Patient Position: Sitting, Cuff Size: Large)   Pulse 89   Temp 98.6 F (37 C) (Oral)   Resp 16   Ht 5\' 2"  (1.575 m)   Wt 141 lb 4.8 oz (64.1 kg)   LMP 06/20/2018 Comment: no urine pregnancy needed per Dr. Amie Critchley  BMI 25.84 kg/m  BP Readings from Last 3 Encounters:  06/27/20 (!) 150/56  05/21/20 139/83  11/03/19 (!) 144/84   Wt Readings from Last 3 Encounters:  06/27/20 141 lb 4.8 oz (64.1 kg)  05/21/20 139 lb 12.8 oz (63.4 kg)  11/03/19 148 lb (67.1 kg)      Physical Exam Vitals reviewed.  Constitutional:      General: She is not in acute distress.    Appearance: Normal appearance. She is well-developed, well-groomed, overweight and well-nourished. She is not diaphoretic.  HENT:     Head: Normocephalic and atraumatic.     Right Ear: Tympanic membrane, ear canal and external ear normal.     Left Ear: Tympanic membrane, ear canal and external ear normal.     Nose: Nose normal.     Mouth/Throat:     Mouth: Oropharynx is clear and moist. Mucous membranes are moist.     Pharynx: Oropharynx is clear. No oropharyngeal exudate.  Eyes:     General: No scleral icterus.       Right eye: No discharge.        Left eye: No discharge.     Extraocular Movements: Extraocular movements intact and EOM normal.     Conjunctiva/sclera: Conjunctivae normal.     Pupils: Pupils are equal, round, and reactive to light.  Neck:     Thyroid: No thyromegaly.      Vascular: No carotid bruit or JVD.     Trachea: No tracheal deviation.  Cardiovascular:     Rate and Rhythm: Normal rate and regular rhythm.     Pulses: Normal pulses and intact distal pulses.     Heart sounds: Normal heart sounds. No murmur heard. No friction rub. No gallop.   Pulmonary:     Effort: Pulmonary effort is normal. No respiratory distress.     Breath sounds: Normal breath sounds. No wheezing or rales.  Chest:     Chest wall: No tenderness.  Abdominal:     General: Abdomen is flat. Bowel sounds are normal. There is no distension.     Palpations: Abdomen is soft. There is no mass.     Tenderness: There is no abdominal tenderness. There is no guarding or rebound.  Musculoskeletal:        General: No tenderness or edema. Normal range of motion.     Cervical back: Normal range of motion and neck supple. No tenderness.     Right lower leg: No edema.     Left lower leg: No edema.  Lymphadenopathy:     Cervical: No cervical adenopathy.  Skin:    General: Skin is warm and dry.     Capillary Refill: Capillary refill takes less than 2 seconds.     Findings: No rash.  Neurological:     General: No focal deficit present.     Mental Status: She is alert and oriented to person, place, and time. Mental status is at baseline.  Psychiatric:        Mood and Affect: Mood and affect and mood normal.        Behavior: Behavior normal. Behavior is cooperative.  Thought Content: Thought content normal.        Judgment: Judgment normal.     Last depression screening scores PHQ 2/9 Scores 06/27/2020 06/23/2019 06/16/2018  PHQ - 2 Score 1 0 0  PHQ- 9 Score - 0 2   Last fall risk screening Fall Risk  06/23/2019  Falls in the past year? 0  Number falls in past yr: 0  Injury with Fall? 0   Last Audit-C alcohol use screening Alcohol Use Disorder Test (AUDIT) 06/23/2019  1. How often do you have a drink containing alcohol? 3  2. How many drinks containing alcohol do you have on  a typical day when you are drinking? 0  3. How often do you have six or more drinks on one occasion? 0  AUDIT-C Score 3  4. How often during the last year have you found that you were not able to stop drinking once you had started? 0  5. How often during the last year have you failed to do what was normally expected from you because of drinking? 0  6. How often during the last year have you needed a first drink in the morning to get yourself going after a heavy drinking session? 0  7. How often during the last year have you had a feeling of guilt of remorse after drinking? 0  8. How often during the last year have you been unable to remember what happened the night before because you had been drinking? 0  9. Have you or someone else been injured as a result of your drinking? 0  10. Has a relative or friend or a doctor or another health worker been concerned about your drinking or suggested you cut down? 0  Alcohol Use Disorder Identification Test Final Score (AUDIT) 3   A score of 3 or more in women, and 4 or more in men indicates increased risk for alcohol abuse, EXCEPT if all of the points are from question 1   No results found for any visits on 06/27/20.  Assessment & Plan    Routine Health Maintenance and Physical Exam  Exercise Activities and Dietary recommendations Goals   None     Immunization History  Administered Date(s) Administered  . Influenza-Unspecified 03/22/2018  . Td 06/02/2016  . Tdap 10/21/2005    Health Maintenance  Topic Date Due  . COVID-19 Vaccine (1) Never done  . INFLUENZA VACCINE  02/12/2020  . MAMMOGRAM  05/25/2021  . PAP SMEAR-Modifier  06/22/2022  . COLONOSCOPY  06/26/2023  . TETANUS/TDAP  06/02/2026  . Hepatitis C Screening  Completed  . HIV Screening  Completed    Discussed health benefits of physical activity, and encouraged her to engage in regular exercise appropriate for her age and condition.  1. Annual physical exam Normal physical  exam today. Will check labs as below and f/u pending lab results. If labs are stable and WNL she will not need to have these rechecked for one year at her next annual physical exam. She is to call the office in the meantime if she has any acute issue, questions or concerns.  2. Encounter for breast cancer screening using non-mammogram modality Mammogram is scheduled for January 2022  3. BMI 25.0-25.9,adult Counseled patient on healthy lifestyle modifications including dieting and exercise.  Will check labs as below and f/u pending results. - CBC with Differential/Platelet - Comprehensive metabolic panel - Hemoglobin A1c - Lipid panel  4. Stable angina pectoris (HCC) Stable. Followed by Cardiology. No symptoms.  Will check labs as below and f/u pending results. - CBC with Differential/Platelet - Comprehensive metabolic panel - Hemoglobin A1c - Lipid panel  5. Benign essential HTN Stable. Continue Amlodipine 5mg . Will check labs as below and f/u pending results. - CBC with Differential/Platelet - Comprehensive metabolic panel - Hemoglobin A1c - Lipid panel  6. Hypothyroidism due to acquired atrophy of thyroid Stable. Continue levothyroxine 14mcg. Will check labs as below and f/u pending results. - CBC with Differential/Platelet - TSH  7. Abnormal blood sugar Diet controlled. Will check labs as below and f/u pending results. - CBC with Differential/Platelet - Comprehensive metabolic panel - Hemoglobin A1c - Lipid panel  8. Hypercholesterolemia Stable. Continue Pravastatin 40mg . Will check labs as below and f/u pending results. - CBC with Differential/Platelet - Comprehensive metabolic panel - Hemoglobin A1c - Lipid panel  9. Multiple food allergies Has tried multiple treatments unsuccessfully. Would like to be tested to see what may be a trigger so she could avoid in the future.  - Ambulatory referral to Allergy   No follow-ups on file.     Reynolds Bowl,  PA-C, have reviewed all documentation for this visit. The documentation on 06/27/20 for the exam, diagnosis, procedures, and orders are all accurate and complete.   Rubye Beach  [facility] [phone] (phone) [phone] (fax)  [place]

## 2020-06-27 ENCOUNTER — Other Ambulatory Visit: Payer: Self-pay

## 2020-06-27 ENCOUNTER — Ambulatory Visit (INDEPENDENT_AMBULATORY_CARE_PROVIDER_SITE_OTHER): Payer: Managed Care, Other (non HMO) | Admitting: Physician Assistant

## 2020-06-27 ENCOUNTER — Encounter: Payer: Self-pay | Admitting: Physician Assistant

## 2020-06-27 VITALS — BP 150/56 | HR 89 | Temp 98.6°F | Resp 16 | Ht 62.0 in | Wt 141.3 lb

## 2020-06-27 DIAGNOSIS — Z1239 Encounter for other screening for malignant neoplasm of breast: Secondary | ICD-10-CM | POA: Diagnosis not present

## 2020-06-27 DIAGNOSIS — Z0000 Encounter for general adult medical examination without abnormal findings: Secondary | ICD-10-CM

## 2020-06-27 DIAGNOSIS — E034 Atrophy of thyroid (acquired): Secondary | ICD-10-CM

## 2020-06-27 DIAGNOSIS — I10 Essential (primary) hypertension: Secondary | ICD-10-CM

## 2020-06-27 DIAGNOSIS — Z6825 Body mass index (BMI) 25.0-25.9, adult: Secondary | ICD-10-CM

## 2020-06-27 DIAGNOSIS — I208 Other forms of angina pectoris: Secondary | ICD-10-CM | POA: Diagnosis not present

## 2020-06-27 DIAGNOSIS — Z91018 Allergy to other foods: Secondary | ICD-10-CM | POA: Diagnosis not present

## 2020-06-27 DIAGNOSIS — R7309 Other abnormal glucose: Secondary | ICD-10-CM

## 2020-06-27 DIAGNOSIS — E7800 Pure hypercholesterolemia, unspecified: Secondary | ICD-10-CM

## 2020-06-27 NOTE — Patient Instructions (Signed)

## 2020-06-28 ENCOUNTER — Other Ambulatory Visit: Payer: Self-pay

## 2020-06-28 ENCOUNTER — Ambulatory Visit (INDEPENDENT_AMBULATORY_CARE_PROVIDER_SITE_OTHER): Payer: Managed Care, Other (non HMO) | Admitting: Gastroenterology

## 2020-06-28 ENCOUNTER — Encounter: Payer: Self-pay | Admitting: Physician Assistant

## 2020-06-28 VITALS — BP 154/75 | HR 96 | Temp 98.4°F | Ht 62.0 in | Wt 140.0 lb

## 2020-06-28 DIAGNOSIS — R1013 Epigastric pain: Secondary | ICD-10-CM

## 2020-06-28 DIAGNOSIS — R932 Abnormal findings on diagnostic imaging of liver and biliary tract: Secondary | ICD-10-CM | POA: Diagnosis not present

## 2020-06-28 LAB — CBC WITH DIFFERENTIAL/PLATELET
Basophils Absolute: 0 10*3/uL (ref 0.0–0.2)
Basos: 1 %
EOS (ABSOLUTE): 0.1 10*3/uL (ref 0.0–0.4)
Eos: 2 %
Hematocrit: 38.6 % (ref 34.0–46.6)
Hemoglobin: 13.1 g/dL (ref 11.1–15.9)
Immature Grans (Abs): 0 10*3/uL (ref 0.0–0.1)
Immature Granulocytes: 0 %
Lymphocytes Absolute: 1.6 10*3/uL (ref 0.7–3.1)
Lymphs: 30 %
MCH: 30.5 pg (ref 26.6–33.0)
MCHC: 33.9 g/dL (ref 31.5–35.7)
MCV: 90 fL (ref 79–97)
Monocytes Absolute: 0.4 10*3/uL (ref 0.1–0.9)
Monocytes: 7 %
Neutrophils Absolute: 3.1 10*3/uL (ref 1.4–7.0)
Neutrophils: 60 %
Platelets: 293 10*3/uL (ref 150–450)
RBC: 4.29 x10E6/uL (ref 3.77–5.28)
RDW: 12.1 % (ref 11.7–15.4)
WBC: 5.3 10*3/uL (ref 3.4–10.8)

## 2020-06-28 LAB — LIPID PANEL
Chol/HDL Ratio: 3.2 ratio (ref 0.0–4.4)
Cholesterol, Total: 227 mg/dL — ABNORMAL HIGH (ref 100–199)
HDL: 72 mg/dL (ref >39)
LDL Chol Calc (NIH): 137 mg/dL — ABNORMAL HIGH (ref 0–99)
Triglycerides: 105 mg/dL (ref 0–149)
VLDL Cholesterol Cal: 18 mg/dL (ref 5–40)

## 2020-06-28 LAB — HEMOGLOBIN A1C
Est. average glucose Bld gHb Est-mCnc: 108 mg/dL
Hgb A1c MFr Bld: 5.4 % (ref 4.8–5.6)

## 2020-06-28 LAB — COMPREHENSIVE METABOLIC PANEL
ALT: 9 IU/L (ref 0–32)
AST: 12 IU/L (ref 0–40)
Albumin/Globulin Ratio: 1.9 (ref 1.2–2.2)
Albumin: 4.5 g/dL (ref 3.8–4.9)
Alkaline Phosphatase: 102 IU/L (ref 44–121)
BUN/Creatinine Ratio: 20 (ref 9–23)
BUN: 15 mg/dL (ref 6–24)
Bilirubin Total: <0.2 mg/dL (ref 0.0–1.2)
CO2: 24 mmol/L (ref 20–29)
Calcium: 9.6 mg/dL (ref 8.7–10.2)
Chloride: 100 mmol/L (ref 96–106)
Creatinine, Ser: 0.76 mg/dL (ref 0.57–1.00)
GFR calc Af Amer: 100 mL/min/{1.73_m2} (ref >59)
GFR calc non Af Amer: 87 mL/min/{1.73_m2} (ref >59)
Globulin, Total: 2.4 g/dL (ref 1.5–4.5)
Glucose: 85 mg/dL (ref 65–99)
Potassium: 4.3 mmol/L (ref 3.5–5.2)
Sodium: 141 mmol/L (ref 134–144)
Total Protein: 6.9 g/dL (ref 6.0–8.5)

## 2020-06-28 LAB — TSH: TSH: 1.98 u[IU]/mL (ref 0.450–4.500)

## 2020-06-28 NOTE — Progress Notes (Signed)
[name] MD, MRCP(U.K) [address]  [place], [place] [address]  Main: [phone]  Fax: [phone]   Primary Care Physician: Mar Daring, PA-C  Primary Gastroenterologist:  Dr. [name]   Chief Complaint  Patient presents with  . Elevated Hepatic Enzymes    HPI: Jordan Baker is a 59 y.o. female   Summary of history :  She was seen initially on 06/15/2018 for for acid reflux, epigastric pain, right upper quadrant pain.Marland Kitchen Her records and note that she was previously been seen by Dr. Vira Agar and had a history of a neuroendocrine tumors which was incidentally found when she underwent her screening colonoscopy. A 1 cm sized lesion was resected and there was some concern that the margins were not clear. Looking back at the records the tumor was in the rectum and she underwent EMR in 2013. Subsequently had rectal EUS in 2016 that was normal and at that point of time was recommended to have a repeat endoscopy in a years time. I do not see any subsequent follow-up.   03/23/2018 ultrasound abdomen: Small cysts are noted in the liver which was also noted previously in 2012 04/13/2018: HIDA scan: Normal gallbladder ejection fraction patient experienced mild abdominal pain while drinking Ensure. 03/12/2018: H. pylori breath test: Negative 03/12/2018: Lipase, CMP, CBC, normal 06/25/2018: EGD+colonoscopy  - 56mm colon polyp excised in the colon. Gastric polyps resected which were hyperplastic   Interval history  08/03/2018-06/28/2020  08/19/2018: CT scan of the abdomen pelvis with contrast showed no acute findings.  Hepatic cysts and 5.8 cm uterine fibroids  Referred back to see me for abnormal LFTs.Marland Kitchen  LFTs checked yesterday are completely normal. She is doing well with constipation on MiraLAX 2 capfuls every other day.  Having some epigastric discomfort on and off.  Prilosec made it worse.  Carafate seems to help better.  She has had her amitriptyline  dose increased recently.  Denies any NSAID use.  No other complaint.    Current Outpatient Medications  Medication Sig Dispense Refill  . ALPRAZolam (XANAX) 0.5 MG tablet TAKE 1 TABLET BY MOUTH AT BEDTIME AS NEEDED FOR ANXIETY 90 tablet 1  . amitriptyline (ELAVIL) 25 MG tablet Take 2 tablets (50 mg total) by mouth at bedtime. 180 tablet 0  . amLODipine (NORVASC) 5 MG tablet Take 1 tablet (5 mg total) by mouth daily. 90 tablet 3  . APPLE CIDER VINEGAR PO Take by mouth. gummies    . cetirizine (ZYRTEC) 10 MG tablet Take by mouth.    . clobetasol (TEMOVATE) 0.05 % external solution APPLY AS DIRECTED ONCE TO TWICE DAILY AS NEEDED. AVOID FACE, GROIN, AND AXILLA 50 mL 0  . cyclobenzaprine (FLEXERIL) 10 MG tablet Take 1 tablet (10 mg total) by mouth 3 (three) times daily as needed. for muscle spams 30 tablet 0  . diclofenac Sodium (VOLTAREN) 1 % GEL     . DOXYCYCLINE HYCLATE PO Take 100 mg by mouth.    . fluticasone (FLONASE) 50 MCG/ACT nasal spray Use 2 spray(s) in each nostril once daily 48 g 3  . Lactobacillus-Inulin (PROBIOTIC DIGESTIVE SUPPORT PO) Take by mouth. Raw probiotic Probiotic and preprobiotic    . levothyroxine (SYNTHROID) 100 MCG tablet Take 1 tablet (100 mcg total) by mouth daily. 90 tablet 3  . NON FORMULARY CPAP (Device) nightly    . NON FORMULARY CBD oil    . pravastatin (PRAVACHOL) 40 MG tablet Take 1 tablet (40 mg total) by mouth daily.  90 tablet 3  . sucralfate (CARAFATE) 1 g tablet Take 1 tablet (1 g total) by mouth 4 (four) times daily -  with meals and at bedtime. 120 tablet 1   No current facility-administered medications for this visit.    Allergies as of 06/28/2020 - Review Complete 06/28/2020  Allergen Reaction Noted  . Augmentin [amoxicillin-pot clavulanate] Rash 09/20/2018  . Nitrofurantoin monohyd macro Rash 05/15/2015    ROS:  General: Negative for anorexia, weight loss, fever, chills, fatigue, weakness. ENT: Negative for hoarseness, difficulty  swallowing , nasal congestion. CV: Negative for chest pain, angina, palpitations, dyspnea on exertion, peripheral edema.  Respiratory: Negative for dyspnea at rest, dyspnea on exertion, cough, sputum, wheezing.  GI: See history of present illness. GU:  Negative for dysuria, hematuria, urinary incontinence, urinary frequency, nocturnal urination.  Endo: Negative for unusual weight change.    Physical Examination:   BP (!) 154/75   Pulse 96   Temp 98.4 F (36.9 C)   Ht 5\' 2"  (1.575 m)   Wt 140 lb (63.5 kg)   LMP 06/20/2018 Comment: no urine pregnancy needed per Dr. Amie Critchley  BMI 25.61 kg/m   General: Well-nourished, well-developed in no acute distress.  Eyes: No icterus. Conjunctivae pink. Neuro: Alert and oriented x 3.  Grossly intact. Skin: Warm and dry, no jaundice.   Psych: Alert and cooperative, normal mood and affect.   Imaging Studies: No results found.  Assessment and Plan:   Jordan Baker is a 59 y.o. y/o female  here to see me for abnormal LFTs.  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 LFTs that were checked yesterday completely normal.  Doing well with her constipation with MiraLAX every other day.  She has some epigastric discomfort which is very likely dyspepsia which has not responded well to Prilosec.  She is already on amitriptyline.  Suggest to add IBgard as needed and take Tums as needed.   Obtain right upper quadrant ultrasound to check for stability of the hepatic cysts    Dr [name]  MD,MRCP [facility]) Follow up in as needed

## 2020-06-29 ENCOUNTER — Other Ambulatory Visit: Payer: Self-pay | Admitting: Physician Assistant

## 2020-06-29 DIAGNOSIS — E7800 Pure hypercholesterolemia, unspecified: Secondary | ICD-10-CM

## 2020-06-29 DIAGNOSIS — M62830 Muscle spasm of back: Secondary | ICD-10-CM

## 2020-06-29 DIAGNOSIS — M792 Neuralgia and neuritis, unspecified: Secondary | ICD-10-CM

## 2020-06-29 DIAGNOSIS — I10 Essential (primary) hypertension: Secondary | ICD-10-CM

## 2020-06-29 DIAGNOSIS — K219 Gastro-esophageal reflux disease without esophagitis: Secondary | ICD-10-CM

## 2020-06-29 DIAGNOSIS — F411 Generalized anxiety disorder: Secondary | ICD-10-CM

## 2020-06-29 DIAGNOSIS — N390 Urinary tract infection, site not specified: Secondary | ICD-10-CM

## 2020-06-29 NOTE — Telephone Encounter (Signed)
Requested medication (s) are due for refill today - discontinued or non delegated Rx requested  Requested medication (s) are on the active medication list - yes- except Omeprazole which was discontinued  Future visit scheduled -no  Last refill: various dates  Notes to clinic: Patient is requesting medications that have either been discontinued or are non delegated Rx- sent for review  Requested Prescriptions  Pending Prescriptions Disp Refills   doxycycline (VIBRA-TABS) 100 MG tablet [Pharmacy Med Name: Doxycycline Hyclate 100 MG Oral Tablet] 30 tablet 0    Sig: TAKE 1 TABLET BY MOUTH ONCE DAILY AS NEEDED      Off-Protocol Failed - 06/29/2020  2:39 PM      Failed - Medication not assigned to a protocol, review manually.      Passed - Valid encounter within last 12 months    Recent Outpatient Visits           2 days ago Annual physical exam   [facility] [place], Anderson Malta M, [place]   1 month ago Other acute gastritis without hemorrhage   [facility] [place], Clearnce Sorrel, [place]   7 months ago [facility], Clearnce Sorrel, [place]   1 year ago Annual physical exam   [facility] Fenton Malling M, [place]   1 year ago [place], Jennifer M, PA-C                  cyclobenzaprine (FLEXERIL) 10 MG tablet [Pharmacy Med Name: Cyclobenzaprine HCl 10 MG Oral Tablet] 30 tablet 0    Sig: Take 1 tablet by mouth three times daily as needed for muscle spasm      Not Delegated - Analgesics:  Muscle Relaxants Failed - 06/29/2020  2:39 PM      Failed - This refill cannot be delegated      Passed - Valid encounter within last 6 months    Recent Outpatient Visits           2 days ago Annual physical exam   [facility] [place], Anderson Malta M, PA-C   1 month ago Other acute gastritis without hemorrhage   [facility] [place], Clearnce Sorrel, [place]   7  months ago [facility], Clearnce Sorrel, [place]   1 year ago Annual physical exam   [facility] [place], Clearnce Sorrel, [place]   1 year ago [place], Jennifer M, PA-C                  omeprazole (PRILOSEC) 20 MG capsule [Pharmacy Med Name: OMEPRAZOLE 20MG  CAP] 90 capsule 0    Sig: Take 1 capsule by mouth once daily      Gastroenterology: Proton Pump Inhibitors Passed - 06/29/2020  2:39 PM      Passed - Valid encounter within last 12 months    Recent Outpatient Visits           2 days ago Annual physical exam   [place], [place]   1 month ago Other acute gastritis without hemorrhage   [facility] [place], Clearnce Sorrel, [place]   7 months ago [facility], Clearnce Sorrel, [place]   1 year ago Annual physical exam   [facility] [place], Clearnce Sorrel, [place]   1 year ago [place], [place], [place]  Signed Prescriptions Disp Refills   amLODipine (NORVASC) 5 MG tablet 90 tablet 0    Sig: Take 1 tablet by mouth once daily      Cardiovascular:  Calcium Channel Blockers Failed - 06/29/2020  2:39 PM      Failed - Last BP in normal range    BP Readings from Last 1 Encounters:  06/28/20 (!) 154/75          Passed - Valid encounter within last 6 months    Recent Outpatient Visits           2 days ago Annual physical exam   [place], [place], PA-C   1 month ago Other acute gastritis without hemorrhage   [facility] [place], Clearnce Sorrel, [place]   7 months ago [facility], Clearnce Sorrel, [place]   1 year ago Annual physical exam   [place], Clearnce Sorrel, [place]   1 year ago [place], Jennifer M, PA-C                   pravastatin (PRAVACHOL) 40 MG tablet 90 tablet 0    Sig: Take 1 tablet by mouth once daily      Cardiovascular:  Antilipid - Statins Failed - 06/29/2020  2:39 PM      Failed - Total Cholesterol in normal range and within 360 days    Cholesterol, Total  Date Value Ref Range Status  06/27/2020 227 (H) 100 - 199 mg/dL Final          Failed - LDL in normal range and within 360 days    LDL Cholesterol (Calc)  Date Value Ref Range Status  06/09/2017 124 (H) mg/dL (calc) Final    Comment:    Reference range: <100 . Desirable range <100 mg/dL for primary prevention;   <70 mg/dL for patients with CHD or diabetic patients  with > or = 2 CHD risk factors. Marland Kitchen LDL-C is now calculated using the Martin-Hopkins  calculation, which is a validated novel method providing  better accuracy than the Friedewald equation in the  estimation of LDL-C.  Cresenciano Genre et al. Annamaria Helling. 1610;960(45): 2061-2068  (http://education.[facility].com/faq/FAQ164)    LDL Chol Calc (NIH)  Date Value Ref Range Status  06/27/2020 137 (H) 0 - 99 mg/dL Final          Passed - HDL in normal range and within 360 days    HDL  Date Value Ref Range Status  06/27/2020 72 >39 mg/dL Final          Passed - Triglycerides in normal range and within 360 days    Triglycerides  Date Value Ref Range Status  06/27/2020 105 0 - 149 mg/dL Final          Passed - Patient is not pregnant      Passed - Valid encounter within last 12 months    Recent Outpatient Visits           2 days ago Annual physical exam   [place], PA-C   1 month ago Other acute gastritis without hemorrhage   [place], Clearnce Sorrel, [place]   7 months ago [facility], Clearnce Sorrel, [place]   1 year ago Annual physical exam   [place], Clearnce Sorrel, [place]   1 year ago [place]  Fenton Malling M, PA-C                  Refused Prescriptions Disp Refills   amitriptyline (ELAVIL) 25 MG tablet [Pharmacy Med Name: Amitriptyline HCl 25 MG Oral Tablet] 180 tablet 0    Sig: TAKE 2 TABLETS BY MOUTH AT BEDTIME      Psychiatry:  Antidepressants - Heterocyclics (TCAs) Passed - 06/29/2020  2:39 PM      Passed - Valid encounter within last 6 months    Recent Outpatient Visits           2 days ago Annual physical exam   [place], Anderson Malta M, PA-C   1 month ago Other acute gastritis without hemorrhage   [facility] [place], Clearnce Sorrel, [place]   7 months ago [facility], Clearnce Sorrel, [place]   1 year ago Annual physical exam   [place], Clearnce Sorrel, [place]   1 year ago [place], Jennifer M, [place]                    Requested Prescriptions  Pending Prescriptions Disp Refills   doxycycline (VIBRA-TABS) 100 MG tablet [Pharmacy Med Name: Doxycycline Hyclate 100 MG Oral Tablet] 30 tablet 0    Sig: TAKE 1 TABLET BY MOUTH ONCE DAILY AS NEEDED      Off-Protocol Failed - 06/29/2020  2:39 PM      Failed - Medication not assigned to a protocol, review manually.      Passed - Valid encounter within last 12 months    Recent Outpatient Visits           2 days ago Annual physical exam   [facility] [place], Anderson Malta M, [place]   1 month ago Other acute gastritis without hemorrhage   [facility] [place], Clearnce Sorrel, [place]   7 months ago [facility], Clearnce Sorrel, [place]   1 year ago Annual physical exam   [facility]enton Malling M, [place]   1 year ago [place], Jennifer M, PA-C                  cyclobenzaprine (FLEXERIL) 10 MG tablet [Pharmacy Med Name: Cyclobenzaprine HCl 10 MG Oral Tablet] 30 tablet 0    Sig: Take 1 tablet by  mouth three times daily as needed for muscle spasm      Not Delegated - Analgesics:  Muscle Relaxants Failed - 06/29/2020  2:39 PM      Failed - This refill cannot be delegated      Passed - Valid encounter within last 6 months    Recent Outpatient Visits           2 days ago Annual physical exam   [place], [place]   1 month ago Other acute gastritis without hemorrhage   [facility] [place], Clearnce Sorrel, [place]   7 months ago [facility], Clearnce Sorrel, [place]   1 year ago Annual physical exam   [facility] [place], Clearnce Sorrel, [place]   1 year ago [place], PA-C                  omeprazole (PRILOSEC) 20 MG capsule [Pharmacy Med Name: OMEPRAZOLE 20MG  CAP] 90 capsule 0  Sig: Take 1 capsule by mouth once daily      Gastroenterology: Proton Pump Inhibitors Passed - 06/29/2020  2:39 PM      Passed - Valid encounter within last 12 months    Recent Outpatient Visits           2 days ago Annual physical exam   [facility] [place], Clearnce Sorrel, [place]   1 month ago Other acute gastritis without hemorrhage   [facility] [place], Clearnce Sorrel, [place]   7 months ago [facility], Clearnce Sorrel, [place]   1 year ago Annual physical exam   [facility] [place], Clearnce Sorrel, [place]   1 year ago [place], [place]                 Signed Prescriptions Disp Refills   amLODipine (NORVASC) 5 MG tablet 90 tablet 0    Sig: Take 1 tablet by mouth once daily      Cardiovascular:  Calcium Channel Blockers Failed - 06/29/2020  2:39 PM      Failed - Last BP in normal range    BP Readings from Last 1 Encounters:  06/28/20 (!) 154/75          Passed - Valid encounter within last 6 months    Recent Outpatient Visits            2 days ago Annual physical exam   [facility] [place], [place], [place]   1 month ago Other acute gastritis without hemorrhage   [facility] [place], Clearnce Sorrel, [place]   7 months ago [facility], Clearnce Sorrel, [place]   1 year ago Annual physical exam   [place], Clearnce Sorrel, [place]   1 year ago [place], Jennifer M, PA-C                  pravastatin (PRAVACHOL) 40 MG tablet 90 tablet 0    Sig: Take 1 tablet by mouth once daily      Cardiovascular:  Antilipid - Statins Failed - 06/29/2020  2:39 PM      Failed - Total Cholesterol in normal range and within 360 days    Cholesterol, Total  Date Value Ref Range Status  06/27/2020 227 (H) 100 - 199 mg/dL Final          Failed - LDL in normal range and within 360 days    LDL Cholesterol (Calc)  Date Value Ref Range Status  06/09/2017 124 (H) mg/dL (calc) Final    Comment:    Reference range: <100 . Desirable range <100 mg/dL for primary prevention;   <70 mg/dL for patients with CHD or diabetic patients  with > or = 2 CHD risk factors. Marland Kitchen LDL-C is now calculated using the Martin-Hopkins  calculation, which is a validated novel method providing  better accuracy than the Friedewald equation in the  estimation of LDL-C.  Cresenciano Genre et al. Annamaria Helling. 3220;254(27): 2061-2068  (http://education.[facility].com/faq/FAQ164)    LDL Chol Calc (NIH)  Date Value Ref Range Status  06/27/2020 137 (H) 0 - 99 mg/dL Final          Passed - HDL in normal range and within 360 days    HDL  Date Value Ref Range Status  06/27/2020 72 >39 mg/dL Final  Passed - Triglycerides in normal range and within 360 days    Triglycerides  Date Value Ref Range Status  06/27/2020 105 0 - 149 mg/dL Final          Passed - Patient is not pregnant      Passed - Valid encounter within last 12 months    Recent  Outpatient Visits           2 days ago Annual physical exam   [place], PA-C   1 month ago Other acute gastritis without hemorrhage   [facility] [place], Clearnce Sorrel, [place]   7 months ago [facility], Clearnce Sorrel, [place]   1 year ago Annual physical exam   [facility] [place], Clearnce Sorrel, [place]   1 year ago [place], [place]                 Refused Prescriptions Disp Refills   amitriptyline (ELAVIL) 25 MG tablet [Pharmacy Med Name: Amitriptyline HCl 25 MG Oral Tablet] 180 tablet 0    Sig: TAKE 2 TABLETS BY MOUTH AT BEDTIME      Psychiatry:  Antidepressants - Heterocyclics (TCAs) Passed - 06/29/2020  2:39 PM      Passed - Valid encounter within last 6 months    Recent Outpatient Visits           2 days ago Annual physical exam   [place], [place]   1 month ago Other acute gastritis without hemorrhage   [facility] [place], Clearnce Sorrel, [place]   7 months ago [facility], Clearnce Sorrel, [place]   1 year ago Annual physical exam   [facility] [place], Clearnce Sorrel, [place]   1 year ago [place], [place], [place]

## 2020-06-29 NOTE — Telephone Encounter (Signed)
Requested medication (s) are due for refill today:   Provider to determine  Requested medication (s) are on the active medication list:   Yes  Future visit scheduled:   No just seen for CPE 2 days ago   Last ordered: 12/16/2019 #90, 1 refill  Non delegated refill   Requested Prescriptions  Pending Prescriptions Disp Refills   ALPRAZolam (XANAX) 0.5 MG tablet [Pharmacy Med Name: ALPRAZolam 0.5 MG Oral Tablet] 90 tablet 0    Sig: TAKE 1 TABLET BY MOUTH AT BEDTIME AS NEEDED FOR ANXIETY      Not Delegated - Psychiatry:  Anxiolytics/Hypnotics Failed - 06/29/2020  2:38 PM      Failed - This refill cannot be delegated      Failed - Urine Drug Screen completed in last 360 days      Passed - Valid encounter within last 6 months    Recent Outpatient Visits           2 days ago Annual physical exam   [place], PA-C   1 month ago Other acute gastritis without hemorrhage   [facility] [place], Clearnce Sorrel, [place]   7 months ago [facility], Clearnce Sorrel, [place]   1 year ago Annual physical exam   [facility] [place], Clearnce Sorrel, [place]   1 year ago [place], [place], [place]

## 2020-07-04 ENCOUNTER — Ambulatory Visit: Payer: Managed Care, Other (non HMO)

## 2020-07-05 ENCOUNTER — Other Ambulatory Visit: Payer: Self-pay

## 2020-07-05 ENCOUNTER — Ambulatory Visit
Admission: RE | Admit: 2020-07-05 | Discharge: 2020-07-05 | Disposition: A | Discharge status: Home / Self Care | Payer: Managed Care, Other (non HMO) | Source: Ambulatory Visit | Attending: Gastroenterology | Admitting: Gastroenterology

## 2020-07-05 DIAGNOSIS — R932 Abnormal findings on diagnostic imaging of liver and biliary tract: Secondary | ICD-10-CM

## 2020-07-09 ENCOUNTER — Encounter: Payer: Self-pay | Admitting: Gastroenterology

## 2020-07-10 ENCOUNTER — Telehealth: Payer: Self-pay

## 2020-07-10 NOTE — Telephone Encounter (Signed)
-----   Message from Wyline Mood, MD sent at 07/09/2020  8:32 AM EST ----- Inform cysts appear stable and benign

## 2020-07-20 ENCOUNTER — Ambulatory Visit
Admission: RE | Admit: 2020-07-20 | Discharge: 2020-07-20 | Disposition: A | Discharge status: Home / Self Care | Payer: Managed Care, Other (non HMO) | Source: Ambulatory Visit | Attending: Physician Assistant | Admitting: Physician Assistant

## 2020-07-20 ENCOUNTER — Other Ambulatory Visit: Payer: Self-pay

## 2020-07-20 DIAGNOSIS — Z1231 Encounter for screening mammogram for malignant neoplasm of breast: Secondary | ICD-10-CM | POA: Insufficient documentation

## 2020-08-01 ENCOUNTER — Ambulatory Visit: Attending: Internal Medicine | Primary: Internal Medicine

## 2020-08-01 ENCOUNTER — Ambulatory Visit
Admit: 2020-08-01 | Discharge: 2020-08-01 | Payer: BLUE CROSS/BLUE SHIELD | Attending: Internal Medicine | Primary: Internal Medicine

## 2020-08-01 DIAGNOSIS — Z0000 Encounter for general adult medical examination without abnormal findings: Secondary | ICD-10-CM

## 2020-08-01 NOTE — Progress Notes (Signed)
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 Morene CrockerPoinsette      Problem List:     Patient Active Problem List        Diagnosis  Code         ?  Postmenopausal  Z78.0           History:   No past medical history on file.        Past Surgical History:         Procedure  Laterality  Date          ?  HX COLONOSCOPY    01/09/2020             Social History          Socioeconomic History         ?  Marital status:  MARRIED              Spouse name:  Not on file         ?  Number of children:  Not on file     ?  Years of education:  Not on file     ?  Highest education level:  Not on file       Occupational History        ?  Not on file       Tobacco Use         ?  Smoking status:  Never Smoker     ?  Smokeless tobacco:  Never Used       Substance and Sexual Activity         ?  Alcohol use:  Never     ?  Drug use:  Never     ?  Sexual activity:  Not on file        Other Topics  Concern        ?  Not on file       Social History Narrative        ?  Not on file  Social Determinants of Health          Financial Resource Strain:         ?  Difficulty of Paying Living Expenses: Not on file       Food Insecurity:         ?  Worried About Running Out of Food in the Last Year: Not on file     ?  Ran Out of Food in the Last Year: Not on file       Transportation Needs:         ?   Lack of Transportation (Medical): Not on file     ?  Lack of Transportation (Non-Medical): Not on file       Physical Activity:         ?  Days of Exercise per Week: Not on file     ?  Minutes of Exercise per Session: Not on file       Stress:         ?  Feeling of Stress : Not on file       Social Connections:         ?  Frequency of Communication with Friends and Family: Not on file     ?  Frequency of Social Gatherings with Friends and Family: Not on file     ?  Attends Religious Services: Not on file     ?  Active Member of Clubs or Organizations: Not on file     ?  Attends Banker Meetings: Not on file     ?  Marital Status: Not on file       Intimate Partner Violence:         ?  Fear of Current or Ex-Partner: Not on file     ?  Emotionally Abused: Not on file     ?  Physically Abused: Not on file     ?  Sexually Abused: Not on file       Housing Stability:         ?  Unable to Pay for Housing in the Last Year: Not on file     ?  Number of Places Lived in the Last Year: Not on file        ?  Unstable Housing in the Last Year: Not on file             Family History         Problem  Relation  Age of Onset          ?  Osteoporosis  Mother       ?  Pancreatic Cancer  Mother  22     ?  Dementia  Mother       ?  Hypertension  Mother            ?  Dementia  Father  71           Allergies:   No Known Allergies      Current Medications: NONE         Immunizations:     Immunization History        Administered  Date(s) Administered         ?  COVID-19, Moderna Booster, PF, 0.73mL Dose  06/26/2020         ?  COVID-19, Moderna, Primary or Immunocompromised Series, MRNA, PF, 145mcg/0.5mL  08/19/2019, 09/16/2019  Review of Systems:   Review of Systems    Constitutional: Negative for weight loss.    HENT:         Decrease hearing in left ear    Eyes:         Contacts    Respiratory: Negative for cough.     Cardiovascular: Negative for chest pain.    Musculoskeletal: Negative for falls.     Psychiatric/Behavioral: Negative for depression and memory loss.    All other systems reviewed and are negative.         Vitals:   Visit Vitals      BP  110/70     Temp  97.2 ??F (36.2 ??C)     Ht  5\' 3"  (1.6 m)     Wt  122 lb 9.6 oz (55.6 kg)        BMI  21.72 kg/m??           Physical Exam:   Physical Exam   Vitals reviewed.    Constitutional:        Appearance: Normal appearance. She is normal weight.   HENT :       Head: Normocephalic and atraumatic.      Right Ear: There is no impacted cerumen.      Left Ear: There is impacted cerumen.   Eyes:       Extraocular Movements: Extraocular movements intact.      Pupils: Pupils are equal, round, and reactive to light.   Cardiovascular:       Rate and Rhythm: Normal rate and regular rhythm.      Heart sounds: Normal heart sounds.    Pulmonary:       Effort: Pulmonary effort is normal.      Breath sounds: Normal breath sounds.   Abdominal :      General: Abdomen is flat. Bowel sounds are normal.      Palpations: Abdomen is soft.     Musculoskeletal:          General: Normal range of motion.      Cervical back: Normal range of motion and neck supple.    Skin:      General: Skin is warm and dry.   Neurological :       General: No focal deficit present.      Mental Status: She is alert and oriented to person, place, and time.    Psychiatric:         Mood and Affect: Mood normal.         Behavior: Behavior normal.         Thought Content: Thought content normal.         Judgment: Judgment normal.             Assessment/Plan:    Diagnoses and all orders for this visit:      1. Wellness examination   -     CBC W/O DIFF; Future   -     METABOLIC PANEL, COMPREHENSIVE; Future   -     LIPID PANEL; Future   -     TSH 3RD GENERATION; Future   -     VITAMIN D, 25 HYDROXY; Future   Update labs    Needs mammo and BD   UTD on colon eval-2021   immun reviewed- need to update Tetanus   Continue healthy lifestyle with diet and regular exercise- discussed yoga or pilates to help with  prevention of bone loss  and    Help with balance   2. Postmenopausal   Did not require HRT   Needs baseline bone density   Start Vitamin Supplement like Nature Bounty Hair Skin Nails   3. Eye exam, routine   Goes to [employer] yrly and wears contacts      4. Impacted cerumen of left ear   -     REMOVAL IMPACTED CERUMEN IRRIGATION/LVG UNILAT   Removal by curette and ear lavage by nursing and tolerated well   Keep ear wax soft with OTC tx like Sweet oil      5. Screening mammogram, encounter for   -     MAM 3D TOMO W MAMMO BI SCREENING INCL CAD; Future      6. Postmenopausal bone loss   -     DEXA BONE DENSITY STUDY AXIAL; Future                  Rufina Falco, MD

## 2020-09-25 ENCOUNTER — Other Ambulatory Visit: Payer: Self-pay | Admitting: Physician Assistant

## 2020-09-25 DIAGNOSIS — N390 Urinary tract infection, site not specified: Secondary | ICD-10-CM

## 2020-09-25 DIAGNOSIS — M792 Neuralgia and neuritis, unspecified: Secondary | ICD-10-CM

## 2020-09-25 DIAGNOSIS — F411 Generalized anxiety disorder: Secondary | ICD-10-CM

## 2020-09-25 DIAGNOSIS — K2900 Acute gastritis without bleeding: Secondary | ICD-10-CM

## 2020-09-25 DIAGNOSIS — E7800 Pure hypercholesterolemia, unspecified: Secondary | ICD-10-CM

## 2020-09-25 NOTE — Telephone Encounter (Signed)
Requested medication (s) are due for refill today: no  Requested medication (s) are on the active medication list:  no  Last refill: 06/29/2020  Future visit scheduled: yes  Notes to clinic:  Medication not assigned to a protocol, review manually   Requested Prescriptions  Pending Prescriptions Disp Refills   doxycycline (VIBRA-TABS) 100 MG tablet [Pharmacy Med Name: Doxycycline Hyclate 100 MG Oral Tablet] 30 tablet 0    Sig: TAKE 1 TABLET BY MOUTH ONCE DAILY AS NEEDED      Off-Protocol Failed - 09/25/2020 11:45 AM      Failed - Medication not assigned to a protocol, review manually.      Passed - Valid encounter within last 12 months    Recent Outpatient Visits           3 months ago Annual physical exam   [place], [place]   4 months ago Other acute gastritis without hemorrhage   [facility] [place], Clearnce Sorrel, [place]   10 months ago [facility], Clearnce Sorrel, [place]   1 year ago Annual physical exam   [place], Clearnce Sorrel, [place]   1 year ago [place], [place], [place]

## 2020-09-25 NOTE — Telephone Encounter (Signed)
Requested medication (s) are due for refill today: yes  Requested medication (s) are on the active medication list: yes  Last refill: 06/29/2020  Future visit scheduled: yes  Notes to clinic: this refill cannot be delegated    Requested Prescriptions  Pending Prescriptions Disp Refills   ALPRAZolam (XANAX) 0.5 MG tablet [Pharmacy Med Name: ALPRAZolam 0.5 MG Oral Tablet] 90 tablet 0    Sig: TAKE 1 TABLET BY MOUTH AT BEDTIME AS NEEDED FOR ANXIETY      Not Delegated - Psychiatry:  Anxiolytics/Hypnotics Failed - 09/25/2020 11:48 AM      Failed - This refill cannot be delegated      Failed - Urine Drug Screen completed in last 360 days      Passed - Valid encounter within last 6 months    Recent Outpatient Visits           3 months ago Annual physical exam   [facility] [place] M[place] M, PA-C   4 months ago Other acute gastritis without hemorrhage   [facility] [place], Clearnce Sorrel, [place]   10 months ago [facility], Clearnce Sorrel, [place]   1 year ago Annual physical exam   [facility] [place], Clearnce Sorrel, [place]   1 year ago [place], Jennifer M, PA-C                  sucralfate (CARAFATE) 1 g tablet [Pharmacy Med Name: Sucralfate 1 GM Oral Tablet] 120 tablet 0    Sig: TAKE 1 TABLET BY MOUTH 4 TIMES DAILY WITH MEALS AND AT BEDTIME      Gastroenterology: Antiacids Passed - 09/25/2020 11:48 AM      Passed - Valid encounter within last 12 months    Recent Outpatient Visits           3 months ago Annual physical exam   [facility] F[place] M[place] M, [place]   4 months ago Other acute gastritis without hemorrhage   [facility] [place], Clearnce Sorrel, [place]   10 months ago [facility] [place] [facility], Clearnce Sorrel, [place]   1 year ago Annual physical exam   [facility] [place], Clearnce Sorrel, [place]   1  year ago [place], [place]                 Signed Prescriptions Disp Refills   amitriptyline (ELAVIL) 25 MG tablet 180 tablet 0    Sig: TAKE 2 TABLETS BY MOUTH AT BEDTIME      Psychiatry:  Antidepressants - Heterocyclics (TCAs) Passed - 09/25/2020 11:48 AM      Passed - Valid encounter within last 6 months    Recent Outpatient Visits           3 months ago Annual physical exam   [facility] [place], Anderson Malta M, [place]   4 months ago Other acute gastritis without hemorrhage   [place], Clearnce Sorrel, [place]   10 months ago [facility], Clearnce Sorrel, [place]   1 year ago Annual physical exam   [facility] [place], Clearnce Sorrel, [place]   1 year ago [place], [place], [place]

## 2020-10-29 ENCOUNTER — Telehealth: Payer: Self-pay

## 2020-10-29 NOTE — Telephone Encounter (Signed)
Copied from [place] [id number]. Topic: General - Other >> Oct 29, 2020  2:27 PM Tessa Lerner A wrote: Reason for CRM: Patient would like to be contacted by a member of administrative staff when possible  Patient has concerns related to their medications and who they would be reassigned to as a patient now that they're PCP has left  Please contact to further advise when possible

## 2020-10-30 NOTE — Telephone Encounter (Signed)
Patient advised, she will be seen by one of the providers here. CPE scheduled.

## 2020-11-17 ENCOUNTER — Encounter

## 2021-01-09 ENCOUNTER — Encounter

## 2021-01-09 ENCOUNTER — Ambulatory Visit
Admit: 2021-01-09 | Discharge: 2021-01-09 | Payer: BLUE CROSS/BLUE SHIELD | Attending: Family | Primary: Internal Medicine

## 2021-01-09 DIAGNOSIS — R002 Palpitations: Secondary | ICD-10-CM

## 2021-01-09 NOTE — Telephone Encounter (Signed)
Spoke with patient regarding referrals

## 2021-01-09 NOTE — Progress Notes (Signed)
Leslie Lucas (DOB: [birth year]) presents today c/o episode last night that woke her up from sleep. She felt palpitations and cool in her arms and legs.  She denies chest pain/pressure, shortness of breath or weakness.  She did get up to urinate.  After sitting for some time and working on relaxation, her symptoms improved.  She had a normal BM an hour or so later.  Afebrile. No ill contacts. No recent travel.  No supplements.  She does drink caffeine (one cup of coffee and 3 cups of unsweet tea) daily.  She occasionally snores and grinds her teeth at night, per her husband. She has been stressed caring for two homes, lots of flower watering. She walks 5 miles per day and has had no symptoms when exercising. Her mother had a brain tumor in her 80's; yet lived to late 52's and died of suspected pancreatic cancer.  Her father is living; he does have some "heart issues" but denies heart diease. Her siblings and children are healthy.    Chief Complaint   Patient presents with   ??? Irregular Heart Beat     "heart racing"   ??? Tingling     bilateral arms      Reviewed and updated this visit by provider:  Tobacco   Allergies   Meds   Problems   Med Hx   Surg Hx   Fam Hx        Immunizations:  Immunization status: up to date and documented.    Review of Systems - Negative except as stated in HPI  History obtained from spouse Loraine Leriche), chart review and the patient  General ROS: negative for - fatigue, night sweats or weight loss  Psychological ROS: positive for - anxiety and increased stress  negative for - depression  ENT ROS: negative for - sinus pain  Respiratory ROS: no cough, shortness of breath, or wheezing  Cardiovascular ROS: positive for - irregular heartbeat and palpitations  negative for - chest pain, dyspnea on exertion, edema, loss of consciousness, orthopnea, paroxysmal nocturnal dyspnea or shortness of breath  Gastrointestinal ROS: no abdominal pain, change in bowel habits, or black or bloody  stools  Genito-Urinary ROS: no dysuria, trouble voiding, or hematuria  Musculoskeletal ROS: negative  Neurological ROS: positive for - numbness/tingling  negative for - behavioral changes, bowel and bladder control changes, confusion, dizziness, gait disturbance, headaches, impaired coordination/balance, memory loss, seizures, speech problems, tremors, visual changes or weakness  Dermatological ROS: negative    Visit Vitals  BP 138/88 (Site: Left Upper Arm, Position: Sitting)   Pulse 74   Temp 97.2 ??F (36.2 ??C) (Temporal)   Ht 5\' 3"  (1.6 m)   Wt 121 lb 12.8 oz (55.2 kg)   SpO2 98%   BMI 21.58 kg/m??     Physical Examination: General appearance - alert, well appearing, and in no distress, oriented to person, place, and time and normal appearing weight  Mental status - alert, oriented to person, place, and time, normal mood, behavior, speech, dress, motor activity, and thought processes, affect appropriate to mood  Eyes - pupils equal and reactive, extraocular eye movements intact  Ears - bilateral TM's and external ear canals normal  Nose - normal and patent, no erythema, discharge or polyps  Mouth - mucous membranes moist, pharynx normal without lesions  Neck - supple, no significant adenopathy, thyroid exam: thyroid is normal in size without nodules or tenderness  Chest - clear to auscultation, no wheezes, rales or rhonchi, symmetric air  entry  Heart - normal rate, regular rhythm, normal S1, S2, no murmurs, rubs, clicks or gallops  Abdomen - soft, nontender, nondistended, no masses or organomegaly  no rebound tenderness noted  bowel sounds normal  Neurological - alert, oriented, normal speech, no focal findings or movement disorder noted, cranial nerves II through XII intact  Musculoskeletal - no joint tenderness, deformity or swelling  Extremities - peripheral pulses normal, no pedal edema, no clubbing or cyanosis  Skin - normal coloration and turgor, no rashes, no suspicious skin lesions noted    EKG: Sinus  rhythm; rate 83; ? atrial enlargement. No ectopy or ST-T wave changes. No comparisons available. Reviewed by Dr. Creola Corn.    Assessment/Plan:  1. Palpitations      Orders Placed This Encounter   Procedures   ??? CT CARDIAC CALCIUM SCORING     Standing Status:   Future     Standing Expiration Date:   01/09/2022   ??? Comprehensive Metabolic Panel     Standing Status:   Future     Number of Occurrences:   1     Standing Expiration Date:   01/09/2022   ??? CBC with Auto Differential     Standing Status:   Future     Number of Occurrences:   1     Standing Expiration Date:   01/09/2022   ??? TSH     Standing Status:   Future     Number of Occurrences:   1     Standing Expiration Date:   01/09/2022   ??? Lipid Panel     Standing Status:   Future     Number of Occurrences:   1     Standing Expiration Date:   01/09/2022     Order Specific Question:   Is Patient Fasting?/# of Hours     Answer:   Yes   ??? PTH, Intact     Standing Status:   Future     Number of Occurrences:   1     Standing Expiration Date:   01/09/2022   ??? Cortisol, Baseline     Standing Status:   Future     Number of Occurrences:   1     Standing Expiration Date:   01/09/2022   ??? [facility]     Referral Priority:   Routine     Referral Type:   Eval and Treat     Referral Reason:   Specialty Services Required     Requested Specialty:   Cardiology     Number of Visits Requested:   1   ??? EKG 12 Lead     Order Specific Question:   Reason for Exam?     Answer:   Irregular heart rate     EKG in office - ? atrial enlargement. Otherwise, no acute findings.  Labs drawn today; CT calcium score and ECHO ordered. Will contact with results and determine plan of care.  For now, continue current medications.  Limit caffeine intake and stressors.    Call or return to clinic prn if these symptoms worsen or fail to improve as anticipated.    Bonne Dolores, NP, APRN - CNP

## 2021-01-09 NOTE — Telephone Encounter (Signed)
-----   Message from Lanagan Chopp sent at 01/09/2021  4:14 PM EDT -----  Subject: Message to Provider    QUESTIONS  Information for Provider? please call patient asap in regards to the   referrals placed today after visit with julia....  ---------------------------------------------------------------------------  --------------  Cleotis Lema INFO  What is the best way for the office to contact you? OK to leave message on   voicemail  Preferred Call Back Phone Number? [phone]  ---------------------------------------------------------------------------  --------------  SCRIPT ANSWERS  Relationship to Patient? Self

## 2021-01-10 ENCOUNTER — Inpatient Hospital Stay: Admit: 2021-01-10 | Payer: BLUE CROSS/BLUE SHIELD | Primary: Internal Medicine

## 2021-01-10 ENCOUNTER — Other Ambulatory Visit: Payer: Self-pay | Admitting: Physician Assistant

## 2021-01-10 DIAGNOSIS — R002 Palpitations: Secondary | ICD-10-CM

## 2021-01-10 DIAGNOSIS — E7800 Pure hypercholesterolemia, unspecified: Secondary | ICD-10-CM

## 2021-01-10 DIAGNOSIS — I10 Essential (primary) hypertension: Secondary | ICD-10-CM

## 2021-01-10 LAB — CBC WITH AUTO DIFFERENTIAL
Absolute Eos #: 0 10*3/uL (ref 0.0–0.8)
Absolute Immature Granulocyte: 0 10*3/uL (ref 0.0–0.5)
Absolute Lymph #: 1.1 10*3/uL (ref 0.5–4.6)
Absolute Mono #: 0.2 10*3/uL (ref 0.1–1.3)
Basophils Absolute: 0 10*3/uL (ref 0.0–0.2)
Basophils: 1 % (ref 0.0–2.0)
Eosinophils %: 1 % (ref 0.5–7.8)
Hematocrit: 41.1 % (ref 35.8–46.3)
Hemoglobin: 13 g/dL (ref 11.7–15.4)
Immature Granulocytes: 0 % (ref 0.0–5.0)
Lymphocytes: 30 % (ref 13–44)
MCH: 31.4 PG (ref 26.1–32.9)
MCHC: 31.6 g/dL (ref 31.4–35.0)
MCV: 99.3 FL — ABNORMAL HIGH (ref 79.6–97.8)
MPV: 12.2 FL (ref 9.4–12.3)
Monocytes: 6 % (ref 4.0–12.0)
Platelets: 192 10*3/uL (ref 150–450)
RBC: 4.14 M/uL (ref 4.05–5.2)
RDW: 12.3 % (ref 11.9–14.6)
Seg Neutrophils: 62 % (ref 43–78)
Segs Absolute: 2.3 10*3/uL (ref 1.7–8.2)
WBC: 3.6 10*3/uL — ABNORMAL LOW (ref 4.3–11.1)
nRBC: 0 10*3/uL (ref 0.0–0.2)

## 2021-01-10 LAB — COMPREHENSIVE METABOLIC PANEL
ALT: 19 U/L (ref 12–65)
AST: 22 U/L (ref 15–37)
Albumin/Globulin Ratio: 1.5 (ref 1.2–3.5)
Albumin: 4.3 g/dL (ref 3.5–5.0)
Alk Phosphatase: 54 U/L (ref 50–136)
Anion Gap: 5 mmol/L — ABNORMAL LOW (ref 7–16)
BUN: 17 MG/DL (ref 6–23)
CO2: 27 mmol/L (ref 21–32)
Calcium: 9.5 MG/DL (ref 8.3–10.4)
Chloride: 109 mmol/L — ABNORMAL HIGH (ref 98–107)
Creatinine: 0.8 MG/DL (ref 0.6–1.0)
GFR African American: >60 mL/min/{1.73_m2} (ref >60)
GFR Non-African American: >60 mL/min/{1.73_m2} (ref >60)
Globulin: 2.8 g/dL (ref 2.3–3.5)
Glucose: 97 mg/dL (ref 65–100)
Potassium: 4.4 mmol/L (ref 3.5–5.1)
Sodium: 141 mmol/L (ref 136–145)
Total Bilirubin: 0.4 MG/DL (ref 0.2–1.1)
Total Protein: 7.1 g/dL (ref 6.3–8.2)

## 2021-01-10 LAB — PTH, INTACT
Calcium: 9.7 MG/DL (ref 8.3–10.4)
Pth Intact: 53.3 pg/mL (ref 18.5–88.0)

## 2021-01-10 LAB — LIPID PANEL
Chol/HDL Ratio: 3.3
Cholesterol, Total: 226 MG/DL — ABNORMAL HIGH (ref <200)
HDL: 68 MG/DL — ABNORMAL HIGH (ref 40–60)
LDL Calculated: 144.8 MG/DL — ABNORMAL HIGH (ref <100)
Triglycerides: 66 MG/DL (ref 35–150)
VLDL Cholesterol Calculated: 13.2 MG/DL (ref 6.0–23.0)

## 2021-01-10 LAB — CORTISOL, BASELINE: Cortisol: 12.3 ug/dL

## 2021-01-10 LAB — TSH: TSH, 3RD GENERATION: 1.72 u[IU]/mL (ref 0.358–3.740)

## 2021-01-16 NOTE — Telephone Encounter (Signed)
Pt calling and is requesting to have this refilled. She states that she only has a few days left. Please advise.

## 2021-01-17 ENCOUNTER — Ambulatory Visit: Payer: BLUE CROSS/BLUE SHIELD | Primary: Internal Medicine

## 2021-01-22 ENCOUNTER — Institutional Professional Consult (permissible substitution)
Admit: 2021-01-22 | Discharge: 2021-01-22 | Payer: BLUE CROSS/BLUE SHIELD | Attending: Cardiovascular Disease | Primary: Internal Medicine

## 2021-01-22 ENCOUNTER — Ambulatory Visit: Admit: 2021-01-22 | Discharge: 2021-02-07 | Payer: BLUE CROSS/BLUE SHIELD | Primary: Internal Medicine

## 2021-01-22 DIAGNOSIS — R002 Palpitations: Secondary | ICD-10-CM

## 2021-01-22 NOTE — Progress Notes (Signed)
[facility]  [address], [address]  [place], [place] [address]  PHONE: [phone]      Leslie Lucas  14-Dec-1960    SUBJECTIVE:   Leslie Lucas is a 60 y.o. female seen for a consultation visit regarding the following:     Chief Complaint   Patient presents with   ??? Consultation            HPI:  Consultation is requested by @REFPROVFL @ for evaluation of Consultation   .    60 year old female referred to me for the evaluation of some episodes of waking up at night with some weird discomfort in her left side for some time and has numbness down her arm pain her left arm particularly.  She felt like her heart rate was faster than normal.  This only occurred at night.  She does snore some but never been told she has sleep apnea.  She gets up and walks for 5 miles a day and has no problem at all.  She has had over elevated cholesterol but no history of hypertension or diabetes or smoking.  No real early family history of heart disease.  She was seen by her family doctor and they did a coronary calcium score which came back 0 EKG showed a sinus rhythm.  She has no history of heart murmurs.          Past Medical History, Past Surgical History, Family history, Social History, and Medications were all reviewed with the patient today and updated as necessary.       No Known Allergies  Past Medical History:   Diagnosis Date   ??? Postmenopausal      Past Surgical History:   Procedure Laterality Date   ??? COLONOSCOPY  01/09/2020    normal with Dr. 01/11/2020     Family History   Problem Relation Age of Onset   ??? Osteoporosis Mother    ??? Pancreatic Cancer Mother 96   ??? Dementia Mother    ??? Hypertension Mother    ??? Dementia Father 26   ??? No Known Problems Sister    ??? No Known Problems Daughter      Social History     Tobacco Use   ??? Smoking status: Never Smoker   ??? Smokeless tobacco: Never Used   Substance Use Topics   ??? Alcohol use: Never       ROS:    Review of Systems   Constitutional: Negative for decreased appetite and  weight loss.   Cardiovascular: Positive for palpitations. Negative for chest pain, dyspnea on exertion, irregular heartbeat and leg swelling.   Respiratory: Negative for cough and shortness of breath.    Hematologic/Lymphatic: Negative for bleeding problem.   Gastrointestinal: Negative for abdominal pain.   Neurological: Negative.  Negative for dizziness and focal weakness.          PHYSICAL EXAM:   BP (!) 128/90    Pulse 100    Ht 5\' 3"  (1.6 m)    Wt 121 lb 9.6 oz (55.2 kg)    BMI 21.54 kg/m??      Physical Exam  Constitutional:       General: She is not in acute distress.  Cardiovascular:      Rate and Rhythm: Normal rate and regular rhythm.      Pulses: Normal pulses.      Heart sounds: No murmur heard.  No gallop.    Pulmonary:      Effort:  Pulmonary effort is normal.   Abdominal:      General: Abdomen is flat.   Musculoskeletal:         General: No swelling.   Skin:     General: Skin is warm.   Neurological:      General: No focal deficit present.      Mental Status: She is alert.   Psychiatric:         Mood and Affect: Mood normal.         Medical problems and test results were reviewed with the patient today.     Results for orders placed or performed in visit on 01/22/21   Extended cardiac holter monitor (48 hrs - 15 days)   Result Value Ref Range    Body Surface Area 1.57 m2       Lab Results   Component Value Date/Time    NA 141 01/09/2021 01:27 PM    K 4.4 01/09/2021 01:27 PM    CL 109 01/09/2021 01:27 PM    CO2 27 01/09/2021 01:27 PM    BUN 17 01/09/2021 01:27 PM    GFRAA >60 01/09/2021 01:27 PM     Lab Results   Component Value Date/Time    CHOL 226 01/09/2021 01:27 PM    HDL 68 01/09/2021 01:27 PM     Lab Results   Component Value Date/Time    ALT 19 01/09/2021 01:27 PM   TSH 1.72 WBC 3.6 hemoglobin 13 platelets 192 cholesterol 226 LDL 144 HDL 68.  CMP normal.  Calcium score 0.  EKG normal sinus rhythm no ST changes.  ASSESSMENT and PLAN    Leslie Lucas was seen today for consultation.    Diagnoses and all  orders for this visit:    Palpitations he does feel like her heart is racing and speeding up.  Not sure some anxiety other issues going on as well.  Blood work looks essentially normal.  The white count is little bit low  -     Extended cardiac holter monitor (48 hrs - 15 days); Future    Pain of left upper extremity patient's wakes up at night with this unusual feeling like something is cool down her body some left arm tightness tingling may be some palpitations associated with it.  This only occurs at night does not occur with exercise or any other time.  Her calcium score is 0 which puts significant obstructive CAD at a very low risk that usually would occur just at night there is no heart murmurs that I can hear nothing suggest she is got any underlying valvular or other issues at this time.  She also has been concerned about snoring possibility waking her up as well.  We will put a monitor on her to make sure she is not having type of arrhythmia occurring at night only        @ASSESSTEXT @      No follow-up provider specified.          Thank you for allowing me to participate in this patient's care.  Please call or contact me if there are any questions or concerns regarding the above.      Alferd Obryant , MD  01/22/21  4:56 PM        Consult note

## 2021-01-23 ENCOUNTER — Ambulatory Visit: Payer: BLUE CROSS/BLUE SHIELD | Primary: Internal Medicine

## 2021-02-06 LAB — EXTENDED CARDIAC HOLTER MONITOR: Body Surface Area: 1.57 m2

## 2021-02-15 ENCOUNTER — Encounter: Payer: Self-pay | Admitting: Emergency Medicine

## 2021-02-15 ENCOUNTER — Telehealth: Payer: Managed Care, Other (non HMO) | Admitting: Emergency Medicine

## 2021-02-15 DIAGNOSIS — Z20822 Contact with and (suspected) exposure to covid-19: Secondary | ICD-10-CM | POA: Diagnosis not present

## 2021-02-15 MED ORDER — IPRATROPIUM BROMIDE 0.03 % NA SOLN: 2.0000 | Freq: Two times a day (BID) | NASAL | 0 refills | Status: DC

## 2021-02-15 MED ORDER — BENZONATATE 100 MG PO CAPS: 100.0000 mg | ORAL_CAPSULE | Freq: Two times a day (BID) | ORAL | 0 refills | Status: DC | PRN

## 2021-02-15 MED ORDER — MOLNUPIRAVIR EUA 200MG CAPSULE: 4.0000 | ORAL_CAPSULE | Freq: Two times a day (BID) | ORAL | 0 refills | Status: AC

## 2021-02-15 NOTE — Patient Instructions (Signed)
Take medications as directed.  Begin taking the Molnupiravir if COVID test is positive.  You might consider repeating a home test tomorrow if the test from [facility] is not back.  Call us back if you are still feeling bad after 3-5 days.

## 2021-02-15 NOTE — Progress Notes (Signed)
Virtual Visit Consent   Baraa Labarr, you are scheduled for a virtual visit with a [facility] provider today.     Just as with appointments in the office, your consent must be obtained to participate.  Your consent will be active for this visit and any virtual visit you may have with one of our providers in the next 365 days.     If you have a MyChart account, a copy of this consent can be sent to you electronically.  All virtual visits are billed to your insurance company just like a traditional visit in the office.    As this is a virtual visit, video technology does not allow for your provider to perform a traditional examination.  This may limit your provider's ability to fully assess your condition.  If your provider identifies any concerns that need to be evaluated in person or the need to arrange testing (such as labs, EKG, etc.), we will make arrangements to do so.     Although advances in technology are sophisticated, we cannot ensure that it will always work on either your end or our end.  If the connection with a video visit is poor, the visit may have to be switched to a telephone visit.  With either a video or telephone visit, we are not always able to ensure that we have a secure connection.     I need to obtain your verbal consent now.   Are you willing to proceed with your visit today?    Jordan Baker has provided verbal consent on 02/15/2021 for a virtual visit video.   Montine Circle, PA-C   Date: 02/15/2021 12:06 PM   Virtual Visit via Video Note   I, Montine Circle, connected with  Jordan Baker  ([id number], 1961-05-15) on 02/15/21 at 12:00 PM EDT by a video-enabled telemedicine application and verified that I am speaking with the correct person using two identifiers.  Location: Patient: Virtual Visit Location Patient: Home Provider: Virtual Visit Location Provider: Home Office   I discussed the limitations of evaluation and management by telemedicine  and the availability of in person appointments. The patient expressed understanding and agreed to proceed.    History of Present Illness: Jordan Baker is a 60 y.o. who identifies as a female who was assigned female at birth, and is being seen today for fever, body aches, cough, and congestion.  Onset was yesterday.  Febrile to 101.7.  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   Chronic Arthralgia of knees (Primary Area of Pain) (Bilateral) (L>R) 02/12/2017   Osteoarthritis of knee (Bilateral) (L>R) 01/05/2017   Chronic knee pain (Primary Area of Pain) (Bilateral) (L>R) 12/31/2016   Incomplete emptying of bladder 06/22/2016   Benign essential HTN 06/10/2016   Stable angina pectoris ([place]) 06/10/2016   Abnormal blood sugar 06/02/2016   Fibroid 05/31/2015   Hypercholesterolemia 05/31/2015   Hot flash, menopausal 05/31/2015   GERD (gastroesophageal reflux disease) 05/15/2015   Hypothyroidism 05/15/2015   Anxiety disorder 05/15/2015   Sleep apnea 05/15/2015   Insomnia 05/15/2015   History of neuroendocrine cancer 02/02/2013   Bladder infection, chronic 08/10/2012   Female genital symptoms 08/10/2012   Increased frequency of urination 08/10/2012    Allergies:  Allergies  Allergen Reactions   Augmentin [Amoxicillin-Pot Clavulanate] Rash   Nitrofurantoin Monohyd Macro Rash   Medications:  Current  Outpatient Medications:    ALPRAZolam (XANAX) 0.5 MG tablet, TAKE 1 TABLET BY MOUTH AT BEDTIME AS NEEDED FOR ANXIETY, Disp: 90 tablet, Rfl: 0   amitriptyline (ELAVIL) 25 MG tablet, TAKE 2 TABLETS BY MOUTH AT BEDTIME, Disp: 180 tablet, Rfl: 0   amLODipine (NORVASC) 5 MG tablet, Take 1 tablet by mouth once daily, Disp: 90 tablet, Rfl: 0   APPLE CIDER VINEGAR PO, Take by mouth. gummies, Disp: , Rfl:    cetirizine (ZYRTEC) 10 MG tablet, Take by mouth., Disp: , Rfl:     clobetasol (TEMOVATE) 0.05 % external solution, APPLY AS DIRECTED ONCE TO TWICE DAILY AS NEEDED. AVOID FACE, GROIN, AND AXILLA, Disp: 50 mL, Rfl: 0   cyclobenzaprine (FLEXERIL) 10 MG tablet, Take 1 tablet by mouth three times daily as needed for muscle spasm, Disp: 30 tablet, Rfl: 0   diclofenac Sodium (VOLTAREN) 1 % GEL, , Disp: , Rfl:    doxycycline (VIBRA-TABS) 100 MG tablet, TAKE 1 TABLET BY MOUTH ONCE DAILY AS NEEDED, Disp: 30 tablet, Rfl: 0   DOXYCYCLINE HYCLATE PO, Take 100 mg by mouth., Disp: , Rfl:    fluticasone (FLONASE) 50 MCG/ACT nasal spray, Use 2 spray(s) in each nostril once daily, Disp: 48 g, Rfl: 3   Lactobacillus-Inulin (PROBIOTIC DIGESTIVE SUPPORT PO), Take by mouth. Raw probiotic Probiotic and preprobiotic, Disp: , Rfl:    levothyroxine (SYNTHROID) 100 MCG tablet, Take 1 tablet (100 mcg total) by mouth daily., Disp: 90 tablet, Rfl: 3   NON FORMULARY, CPAP (Device) nightly, Disp: , Rfl:    NON FORMULARY, CBD oil, Disp: , Rfl:    omeprazole (PRILOSEC) 20 MG capsule, Take 1 capsule by mouth once daily, Disp: 90 capsule, Rfl: 0   pravastatin (PRAVACHOL) 40 MG tablet, Take 1 tablet by mouth once daily, Disp: 90 tablet, Rfl: 0   sucralfate (CARAFATE) 1 g tablet, TAKE 1 TABLET BY MOUTH 4 TIMES DAILY WITH MEALS AND AT BEDTIME, Disp: 120 tablet, Rfl: 0  Observations/Objective: Patient is well-developed, well-nourished in no acute distress.  Resting comfortably at home.  Head is normocephalic, atraumatic.  No labored breathing.  Speech is clear and coherent with logical content.  Patient is alert and oriented at baseline.    Assessment and Plan: 1. Suspected COVID-19 virus infection   Follow Up Instructions: I discussed the assessment and treatment plan with the patient. The patient was provided an opportunity to ask questions and all were answered. The patient agreed with the plan and demonstrated an understanding of the instructions.  A copy of instructions were sent to the  patient via MyChart.  The patient was advised to call back or seek an in-person evaluation if the symptoms worsen or if the condition fails to improve as anticipated.  Time:  I spent 13 minutes with the patient via telehealth technology discussing the above problems/concerns.    Montine Circle, PA-C

## 2021-02-21 ENCOUNTER — Ambulatory Visit
Admit: 2021-02-21 | Discharge: 2021-02-21 | Payer: BLUE CROSS/BLUE SHIELD | Attending: Cardiovascular Disease | Primary: Internal Medicine

## 2021-02-21 DIAGNOSIS — R002 Palpitations: Secondary | ICD-10-CM

## 2021-02-21 NOTE — Progress Notes (Signed)
[address], [address]  [place], [place] [address]  PHONE: [phone]    Leslie Lucas  01-Jan-1961      SUBJECTIVE:   Leslie Lucas is a 60 y.o. female seen for a follow up visit regarding the following:     Chief Complaint   Patient presents with    Results     Zio     Palpitations              HPI:    60 year old female comes back for follow-up of her monitor.  She has had no history of heart disease but is had 1 night some symptoms of numbness in different symptoms but she thought she might have a heart attack.  She walks 5 miles a day and does well her calcium score has been 0 and she has no risk factors for obstructive disease.  We put a monitor on her see if she is having any tachycardia which she wore.  She did not really notice any events during the monitor.  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       No Known Allergies  Past Medical History:   Diagnosis Date    Postmenopausal      Past Surgical History:   Procedure Laterality Date    COLONOSCOPY  01/09/2020    normal with Dr. Maris Berger     Family History   Problem Relation Age of Onset    Osteoporosis Mother     Pancreatic Cancer Mother 65    Dementia Mother     Hypertension Mother     Dementia Father 31    No Known Problems Sister     No Known Problems Daughter       Social History     Tobacco Use    Smoking status: Never    Smokeless tobacco: Never   Substance Use Topics    Alcohol use: Never       ROS:    Review of Systems   Cardiovascular:  Negative for chest pain, dyspnea on exertion, irregular heartbeat and leg swelling.   Respiratory:  Negative for shortness of breath.          PHYSICAL EXAM:    BP (!) 140/80    Pulse 92    Ht 5\' 3"  (1.6 m)    Wt 121 lb 9.6 oz (55.2  kg)    BMI 21.54 kg/m??        Wt Readings from Last 3 Encounters:   02/21/21 121 lb 9.6 oz (55.2 kg)   01/22/21 121 lb 9.6 oz (55.2 kg)   01/09/21 121 lb 12.8 oz (55.2 kg)     BP Readings from Last 3 Encounters:   02/21/21 (!) 140/80   01/22/21 (!) 128/90   01/09/21 138/88         Physical Exam  Constitutional:       General: She is not in acute distress.  Cardiovascular:      Rate and Rhythm: Normal rate and regular rhythm.      Heart sounds:     No gallop.   Musculoskeletal:         General: No swelling.  Neurological:      Mental Status: She is alert.       Medical problems and test results were reviewed with the patient today.     No results found for any visits on 02/21/21.  Lab Results   Component Value Date/Time    NA 141 01/09/2021 01:27 PM    K 4.4 01/09/2021 01:27 PM    CL 109 01/09/2021 01:27 PM    CO2 27 01/09/2021 01:27 PM    BUN 17 01/09/2021 01:27 PM    GFRAA >60 01/09/2021 01:27 PM     Lab Results   Component Value Date/Time    CHOL 226 01/09/2021 01:27 PM    HDL 68 01/09/2021 01:27 PM     Review of her extended monitor shows sinus rhythm.  She has some sinus tachycardia which is normal.  She had a rare PAC and 1 run of short duration of atrial tach.  She did not notice it.  During sleep she had a rate of 48 sinus rhythm    ASSESSMENT and PLAN    Leslie Lucas was seen today for results and palpitations.    Diagnoses and all orders for this visit:    Palpitations patient's had some PACs and a little brief run of atrial tach no atrial fibs noted no SVT detected.  She has had no symptoms with the monitor arm.  I showed her the rhythm we will consider the possibly of an oral beta-blocker although she is not having it often.  She had a 0 calcium score she has no other real risk.  This is a very frequent events occurring infections    Atrial tachycardia (HCC) atrial tach as noted above  Elevated blood pressure still a bit higher but not definition of hypertension she gets anxious.  We will have her start  checking her blood pressure more frequently at home and write it down if it stays high other option be a little bit of Toprol which would help blood pressure and the tachycardia      @ASSESSTEXT @      No follow-up provider specified.    Leslie Lucas , MD  02/21/2021  1:59 PM

## 2021-03-06 ENCOUNTER — Ambulatory Visit: Payer: BLUE CROSS/BLUE SHIELD | Primary: Internal Medicine

## 2021-03-10 ENCOUNTER — Other Ambulatory Visit: Payer: Self-pay | Admitting: Physician Assistant

## 2021-03-10 DIAGNOSIS — M792 Neuralgia and neuritis, unspecified: Secondary | ICD-10-CM

## 2021-03-10 DIAGNOSIS — F411 Generalized anxiety disorder: Secondary | ICD-10-CM

## 2021-03-12 NOTE — Telephone Encounter (Signed)
LOV 06/27/2020 (CPE) NOV 06/28/2021   Last Refill Alprazolam 09/25/2020 #90 0 Refills Amitriptyline 09/25/2020 #180 0 Refills

## 2021-03-12 NOTE — Telephone Encounter (Signed)
Nope.  Needs to make a sooner f/u with Daneil Dan to establish before additional refills can be given of her controlled substance.

## 2021-03-20 ENCOUNTER — Ambulatory Visit (INDEPENDENT_AMBULATORY_CARE_PROVIDER_SITE_OTHER): Payer: Managed Care, Other (non HMO) | Admitting: Family Medicine

## 2021-03-20 ENCOUNTER — Encounter: Payer: Self-pay | Admitting: Family Medicine

## 2021-03-20 ENCOUNTER — Other Ambulatory Visit: Payer: Self-pay

## 2021-03-20 VITALS — BP 165/83 | HR 125 | Temp 98.4°F | Ht 62.0 in | Wt 149.3 lb

## 2021-03-20 DIAGNOSIS — F411 Generalized anxiety disorder: Secondary | ICD-10-CM

## 2021-03-20 DIAGNOSIS — E034 Atrophy of thyroid (acquired): Secondary | ICD-10-CM

## 2021-03-20 DIAGNOSIS — I10 Essential (primary) hypertension: Secondary | ICD-10-CM

## 2021-03-20 DIAGNOSIS — K5904 Chronic idiopathic constipation: Secondary | ICD-10-CM | POA: Diagnosis not present

## 2021-03-20 DIAGNOSIS — E7800 Pure hypercholesterolemia, unspecified: Secondary | ICD-10-CM

## 2021-03-20 DIAGNOSIS — J301 Allergic rhinitis due to pollen: Secondary | ICD-10-CM | POA: Insufficient documentation

## 2021-03-20 DIAGNOSIS — M792 Neuralgia and neuritis, unspecified: Secondary | ICD-10-CM | POA: Diagnosis not present

## 2021-03-20 DIAGNOSIS — N390 Urinary tract infection, site not specified: Secondary | ICD-10-CM | POA: Insufficient documentation

## 2021-03-20 MED ORDER — PRAVASTATIN SODIUM 40 MG PO TABS: 40.0000 mg | ORAL_TABLET | Freq: Every day | ORAL | 3 refills | Status: DC

## 2021-03-20 MED ORDER — FLUTICASONE PROPIONATE 50 MCG/ACT NA SUSP: NASAL | 3 refills | Status: DC

## 2021-03-20 MED ORDER — DOXYCYCLINE HYCLATE 100 MG PO TABS: 100.0000 mg | ORAL_TABLET | Freq: Two times a day (BID) | ORAL | 0 refills | Status: DC | PRN

## 2021-03-20 MED ORDER — LEVOTHYROXINE SODIUM 100 MCG PO TABS: 100.0000 ug | ORAL_TABLET | Freq: Every day | ORAL | 3 refills | Status: DC

## 2021-03-20 MED ORDER — SERTRALINE HCL 50 MG PO TABS
50.0000 mg | ORAL_TABLET | Freq: Every day | ORAL | 3 refills | Status: DC
Start: 2021-03-20 — End: 2021-06-28

## 2021-03-20 MED ORDER — AMITRIPTYLINE HCL 25 MG PO TABS
25.0000 mg | ORAL_TABLET | Freq: Every day | ORAL | 3 refills | Status: DC
Start: 2021-03-20 — End: 2021-06-28

## 2021-03-20 MED ORDER — ALPRAZOLAM 0.5 MG PO TABS: 0.5000 mg | ORAL_TABLET | Freq: Every day | ORAL | 0 refills | Status: DC | PRN

## 2021-03-20 NOTE — Assessment & Plan Note (Signed)
Chronic concern Appears to be higher at home that what pt reports at home  Home values 110-120s/60-70s Denies CP, SOB, DOE, LE edema

## 2021-03-20 NOTE — Progress Notes (Signed)
Established patient visit   Patient: Jordan Baker   DOB: January 07, 1961   60 y.o. Female  MRN: [id number] Visit Date: 03/20/2021  Today's healthcare provider: Gwyneth Sprout, FNP   Chief Complaint  Patient presents with   Medication Refill   Subjective    HPI      Medications: Outpatient Medications Prior to Visit  Medication Sig   amLODipine (NORVASC) 5 MG tablet Take 1 tablet by mouth once daily   APPLE CIDER VINEGAR PO Take by mouth. gummies   cetirizine (ZYRTEC) 10 MG tablet Take by mouth.   clobetasol (TEMOVATE) 0.05 % external solution APPLY AS DIRECTED ONCE TO TWICE DAILY AS NEEDED. AVOID FACE, GROIN, AND AXILLA   cyclobenzaprine (FLEXERIL) 10 MG tablet Take 1 tablet by mouth three times daily as needed for muscle spasm   diclofenac Sodium (VOLTAREN) 1 % GEL    Lactobacillus-Inulin (PROBIOTIC DIGESTIVE SUPPORT PO) Take by mouth. Raw probiotic Probiotic and preprobiotic   MAGNESIUM PO Take 400 mg by mouth at bedtime.   Multiple Vitamins-Minerals (ZINC PO) Take 50 mg by mouth daily.   NON FORMULARY CPAP (Device) nightly   NON FORMULARY CBD oil   Psyllium (EQ DAILY FIBER) 400 MG CAPS Take by mouth.   sucralfate (CARAFATE) 1 g tablet TAKE 1 TABLET BY MOUTH 4 TIMES DAILY WITH MEALS AND AT BEDTIME   VITAMIN D PO Take 1,000 Int'l Units/day by mouth.   VITAMIN E PO Take 180 mg by mouth daily.   [DISCONTINUED] ALPRAZolam (XANAX) 0.5 MG tablet TAKE 1 TABLET BY MOUTH AT BEDTIME AS NEEDED FOR ANXIETY   [DISCONTINUED] amitriptyline (ELAVIL) 25 MG tablet TAKE 2 TABLETS BY MOUTH AT BEDTIME   [DISCONTINUED] doxycycline (VIBRA-TABS) 100 MG tablet TAKE 1 TABLET BY MOUTH ONCE DAILY AS NEEDED   [DISCONTINUED] DOXYCYCLINE HYCLATE PO Take 100 mg by mouth.   [DISCONTINUED] fluticasone (FLONASE) 50 MCG/ACT nasal spray Use 2 spray(s) in each nostril once daily   [DISCONTINUED] ipratropium (ATROVENT) 0.03 % nasal spray Place 2 sprays into both nostrils every 12 (twelve) hours.    [DISCONTINUED] levothyroxine (SYNTHROID) 100 MCG tablet Take 1 tablet (100 mcg total) by mouth daily.   [DISCONTINUED] pravastatin (PRAVACHOL) 40 MG tablet Take 1 tablet by mouth once daily   [DISCONTINUED] benzonatate (TESSALON) 100 MG capsule Take 1 capsule (100 mg total) by mouth 2 (two) times daily as needed for cough. (Patient not taking: Reported on 03/20/2021)   [DISCONTINUED] omeprazole (PRILOSEC) 20 MG capsule Take 1 capsule by mouth once daily   No facility-administered medications prior to visit.    Review of Systems     Objective    BP (!) 165/83 (BP Location: Right Arm, Patient Position: Sitting, Cuff Size: Normal)   Pulse (!) 125   Temp 98.4 F (36.9 C) (Oral)   Ht '5\' 2"'$  (1.575 m)   Wt 149 lb 4.8 oz (67.7 kg)   LMP 06/20/2018 Comment: no urine pregnancy needed per Dr. Amie Critchley  SpO2 98%   BMI 27.31 kg/m  {Show previous vital signs (optional):23777}  Physical Exam Vitals and nursing note reviewed.  Constitutional:      General: She is not in acute distress.    Appearance: Normal appearance. She is overweight. She is not ill-appearing, toxic-appearing or diaphoretic.  HENT:     Head: Normocephalic and atraumatic.  Cardiovascular:     Rate and Rhythm: Normal rate and regular rhythm.     Pulses: Normal pulses.     Heart sounds:  Normal heart sounds. No murmur heard.   No friction rub. No gallop.  Pulmonary:     Effort: Pulmonary effort is normal. No respiratory distress.     Breath sounds: Normal breath sounds. No stridor. No wheezing, rhonchi or rales.  Chest:     Chest wall: No tenderness.  Abdominal:     General: Bowel sounds are normal.     Palpations: Abdomen is soft.  Musculoskeletal:        General: No swelling, tenderness, deformity or signs of injury. Normal range of motion.     Right lower leg: No edema.     Left lower leg: No edema.  Skin:    General: Skin is warm and dry.     Capillary Refill: Capillary refill takes less than 2 seconds.      Coloration: Skin is not jaundiced or pale.     Findings: No bruising, erythema, lesion or rash.  Neurological:     General: No focal deficit present.     Mental Status: She is alert and oriented to person, place, and time. Mental status is at baseline.     Cranial Nerves: No cranial nerve deficit.     Sensory: No sensory deficit.     Motor: No weakness.     Coordination: Coordination normal.  Psychiatric:        Mood and Affect: Mood normal.        Behavior: Behavior normal.        Thought Content: Thought content normal.        Judgment: Judgment normal.     No results found for any visits on 03/20/21.  Assessment & Plan     Problem List Items Addressed This Visit       Cardiovascular and Mediastinum   Benign essential HTN    Chronic concern Appears to be higher at home that what pt reports at home  Home values 110-120s/60-70s Denies CP, SOB, DOE, LE edema      Relevant Medications   pravastatin (PRAVACHOL) 40 MG tablet     Respiratory   Chronic seasonal allergic rhinitis due to pollen   Relevant Medications   fluticasone (FLONASE) 50 MCG/ACT nasal spray     Digestive   Chronic idiopathic constipation - Primary   Relevant Medications   Psyllium (EQ DAILY FIBER) 400 MG CAPS   Other Relevant Orders   CBC with Differential/Platelet   Comprehensive metabolic panel   Hemoglobin A1c   Lipid panel   Magnesium   T4, free   TSH   Vitamin B12   VITAMIN D 25 Hydroxy (Vit-D Deficiency, Fractures)     Endocrine   Hypothyroidism    Some complaints of racing HR No other s/s Repeat lab work today Refill sent      Relevant Medications   levothyroxine (SYNTHROID) 100 MCG tablet     Genitourinary   Recurrent UTI    Frequent uti following sexual encounters Refill of doxy today       Relevant Medications   doxycycline (VIBRA-TABS) 100 MG tablet     Other   Anxiety disorder    Start zoloft per pt request Continue xanax- database reviewed Pt with lots of  stressors r/t ibs-c as well as family concerns Continue to follow up on use of medication and titration Reports son is on medication and takes '200mg'$       Relevant Medications   ALPRAZolam (XANAX) 0.5 MG tablet   amitriptyline (ELAVIL) 25 MG tablet   sertraline (ZOLOFT) 50  MG tablet   Hypercholesterolemia    Non fasting labs repeated today Encourage diet control and exercise      Relevant Medications   pravastatin (PRAVACHOL) 40 MG tablet   Nerve pain    Discussed drug dosing- taking only '25mg'$  Has been on med for >20 years Concern for constipation Continue to weigh risk vs benefit Refill sent today at correct dose      Relevant Medications   amitriptyline (ELAVIL) 25 MG tablet     Return in about 6 months (around 09/17/2021) for chonic disease management.     Vonna Kotyk, FNP, have reviewed all documentation for this visit. The documentation on 03/20/21 for the exam, diagnosis, procedures, and orders are all accurate and complete.    Gwyneth Sprout, [place] [phone] (phone) [phone] (fax)  [place]

## 2021-03-20 NOTE — Assessment & Plan Note (Signed)
Frequent uti following sexual encounters Refill of doxy today

## 2021-03-20 NOTE — Assessment & Plan Note (Signed)
Non fasting labs repeated today Encourage diet control and exercise

## 2021-03-20 NOTE — Assessment & Plan Note (Signed)
Start zoloft per pt request Continue xanax- database reviewed Pt with lots of stressors r/t ibs-c as well as family concerns Continue to follow up on use of medication and titration Reports son is on medication and takes '200mg'$ 

## 2021-03-20 NOTE — Assessment & Plan Note (Signed)
Some complaints of racing HR No other s/s Repeat lab work today Refill sent

## 2021-03-20 NOTE — Assessment & Plan Note (Signed)
Discussed drug dosing- taking only '25mg'$  Has been on med for >20 years Concern for constipation Continue to weigh risk vs benefit Refill sent today at correct dose

## 2021-03-21 LAB — CBC WITH DIFFERENTIAL/PLATELET
Basophils Absolute: 0.1 10*3/uL (ref 0.0–0.2)
Basos: 1 %
EOS (ABSOLUTE): 0 10*3/uL (ref 0.0–0.4)
Eos: 1 %
Hematocrit: 39.3 % (ref 34.0–46.6)
Hemoglobin: 13.8 g/dL (ref 11.1–15.9)
Immature Grans (Abs): 0 10*3/uL (ref 0.0–0.1)
Immature Granulocytes: 0 %
Lymphocytes Absolute: 1.7 10*3/uL (ref 0.7–3.1)
Lymphs: 23 %
MCH: 31.3 pg (ref 26.6–33.0)
MCHC: 35.1 g/dL (ref 31.5–35.7)
MCV: 89 fL (ref 79–97)
Monocytes Absolute: 0.4 10*3/uL (ref 0.1–0.9)
Monocytes: 6 %
Neutrophils Absolute: 5.2 10*3/uL (ref 1.4–7.0)
Neutrophils: 69 %
Platelets: 265 10*3/uL (ref 150–450)
RBC: 4.41 x10E6/uL (ref 3.77–5.28)
RDW: 11.9 % (ref 11.7–15.4)
WBC: 7.4 10*3/uL (ref 3.4–10.8)

## 2021-03-21 LAB — LIPID PANEL
Chol/HDL Ratio: 3.6 ratio (ref 0.0–4.4)
Cholesterol, Total: 247 mg/dL — ABNORMAL HIGH (ref 100–199)
HDL: 69 mg/dL (ref >39)
LDL Chol Calc (NIH): 164 mg/dL — ABNORMAL HIGH (ref 0–99)
Triglycerides: 80 mg/dL (ref 0–149)
VLDL Cholesterol Cal: 14 mg/dL (ref 5–40)

## 2021-03-21 LAB — MAGNESIUM: Magnesium: 2.2 mg/dL (ref 1.6–2.3)

## 2021-03-21 LAB — COMPREHENSIVE METABOLIC PANEL
ALT: 14 IU/L (ref 0–32)
AST: 17 IU/L (ref 0–40)
Albumin/Globulin Ratio: 2.3 — ABNORMAL HIGH (ref 1.2–2.2)
Albumin: 4.9 g/dL (ref 3.8–4.9)
Alkaline Phosphatase: 97 IU/L (ref 44–121)
BUN/Creatinine Ratio: 18 (ref 9–23)
BUN: 12 mg/dL (ref 6–24)
Bilirubin Total: <0.2 mg/dL (ref 0.0–1.2)
CO2: 24 mmol/L (ref 20–29)
Calcium: 9.7 mg/dL (ref 8.7–10.2)
Chloride: 101 mmol/L (ref 96–106)
Creatinine, Ser: 0.67 mg/dL (ref 0.57–1.00)
Globulin, Total: 2.1 g/dL (ref 1.5–4.5)
Glucose: 107 mg/dL — ABNORMAL HIGH (ref 65–99)
Potassium: 3.6 mmol/L (ref 3.5–5.2)
Sodium: 140 mmol/L (ref 134–144)
Total Protein: 7 g/dL (ref 6.0–8.5)
eGFR: 101 mL/min/{1.73_m2} (ref >59)

## 2021-03-21 LAB — VITAMIN D 25 HYDROXY (VIT D DEFICIENCY, FRACTURES): Vit D, 25-Hydroxy: 46.1 ng/mL (ref 30.0–100.0)

## 2021-03-21 LAB — HEMOGLOBIN A1C
Est. average glucose Bld gHb Est-mCnc: 114 mg/dL
Hgb A1c MFr Bld: 5.6 % (ref 4.8–5.6)

## 2021-03-21 LAB — T4, FREE: Free T4: 1.72 ng/dL (ref 0.82–1.77)

## 2021-03-21 LAB — TSH: TSH: 0.732 u[IU]/mL (ref 0.450–4.500)

## 2021-03-21 LAB — VITAMIN B12: Vitamin B-12: 1494 pg/mL — ABNORMAL HIGH (ref 232–1245)

## 2021-04-18 ENCOUNTER — Telehealth: Payer: Self-pay

## 2021-04-18 ENCOUNTER — Telehealth: Payer: Self-pay | Admitting: Gastroenterology

## 2021-04-18 NOTE — Telephone Encounter (Signed)
Pt. Ready to schedule 3 year colonoscopy  

## 2021-04-18 NOTE — Telephone Encounter (Signed)
Patient called her colonsocopy not due till 2025  she will call back then

## 2021-04-30 ENCOUNTER — Telehealth: Payer: Self-pay

## 2021-04-30 NOTE — Telephone Encounter (Signed)
Copied from [place] [id number]. Topic: Appointment Scheduling - Scheduling Inquiry for Clinic >> Apr 30, 2021 10:30 AM Celene Kras wrote: Reason for CRM: Pt called stating that she would like to have her CPE scheduled with Daneil Dan instead of Dr. Jacinto Reap. She states that she really liked Daneil Dan and would like to be seen by her. Please advise.

## 2021-05-06 ENCOUNTER — Telehealth: Payer: Self-pay

## 2021-05-06 NOTE — Telephone Encounter (Signed)
Contacted patient back and rescheduled appt for afternoon. KW

## 2021-05-06 NOTE — Telephone Encounter (Signed)
Copied from [place] [id number]. Topic: Appointment Scheduling - Scheduling Inquiry for Clinic >> May 06, 2021 12:22 PM Yvette Rack wrote: Reason for CRM: Pt would like to reschedule appt with Tally Joe. Cb# [phone]

## 2021-05-10 ENCOUNTER — Ambulatory Visit: Payer: BLUE CROSS/BLUE SHIELD | Primary: Internal Medicine

## 2021-05-13 ENCOUNTER — Other Ambulatory Visit: Payer: Self-pay | Admitting: Physician Assistant

## 2021-05-13 DIAGNOSIS — E034 Atrophy of thyroid (acquired): Secondary | ICD-10-CM

## 2021-05-24 ENCOUNTER — Encounter: Payer: BLUE CROSS/BLUE SHIELD | Attending: Cardiovascular Disease | Primary: Internal Medicine

## 2021-05-24 ENCOUNTER — Ambulatory Visit
Admit: 2021-05-24 | Discharge: 2021-05-24 | Payer: BLUE CROSS/BLUE SHIELD | Attending: Cardiovascular Disease | Primary: Internal Medicine

## 2021-05-24 DIAGNOSIS — I471 Supraventricular tachycardia: Secondary | ICD-10-CM

## 2021-05-24 MED ORDER — METOPROLOL SUCCINATE ER 25 MG PO TB24
25 MG | ORAL_TABLET | Freq: Every day | ORAL | 3 refills | Status: DC
Start: 2021-05-24 — End: 2021-08-07

## 2021-05-24 NOTE — Progress Notes (Signed)
[address], [address]  [place], [place] [address]  PHONE: [phone]    Leslie Lucas  Dec 09, 1960      SUBJECTIVE:   Leslie Lucas is a 60 y.o. female seen for a follow up visit regarding the following:     Chief Complaint   Patient presents with    Palpitations       HPI:    60 year old female comes back for follow-up of her some palpitations and elevated blood pressure.  We did a calcium score which came back 0 we did a monitor on her showed some brief atrial tachycardia.  She walks every day and has no problem.  She has had some more anxiety she states.  Is wonder if she needs something for that and she is going to talk to her family doctor.      Past Medical History, Past Surgical History, Family history, Social History, and Medications were all reviewed with the patient today and updated as necessary.       No Known Allergies  Past Medical History:   Diagnosis Date    Postmenopausal      Past Surgical History:   Procedure Laterality Date    COLONOSCOPY  01/09/2020    normal with Dr. Maris Berger     Family History   Problem Relation Age of Onset    Osteoporosis Mother     Pancreatic Cancer Mother 34    Dementia Mother     Hypertension Mother     Dementia Father 41    No Known Problems Sister     No Known Problems Daughter       Social History     Tobacco Use    Smoking status: Never    Smokeless tobacco: Never   Substance Use Topics    Alcohol use: Never       ROS:    Review of Systems   Constitutional: Negative for decreased appetite and weight loss.   Cardiovascular:  Positive for palpitations. Negative for chest pain and leg swelling.   Respiratory:  Negative for cough and shortness of breath.          PHYSICAL EXAM:    BP 114/80    Pulse 100    Ht 5\' 3"  (1.6 m)    Wt 122 lb 3.2 oz (55.4 kg)    BMI 21.65 kg/m??        Wt Readings from Last 3 Encounters:   05/24/21 122 lb 3.2 oz (55.4 kg)   02/21/21 121 lb 9.6 oz (55.2 kg)   01/22/21 121 lb 9.6 oz (55.2 kg)     BP Readings from Last 3 Encounters:   05/24/21  114/80   02/21/21 (!) 140/80   01/22/21 (!) 128/90         Physical Exam  Constitutional:       General: She is not in acute distress.  Cardiovascular:      Rate and Rhythm: Normal rate and regular rhythm.      Heart sounds: No murmur heard.    No gallop.   Pulmonary:      Effort: Pulmonary effort is normal.      Breath sounds: Normal breath sounds.   Abdominal:      Tenderness: There is no abdominal tenderness.   Musculoskeletal:         General: No swelling.   Neurological:      Mental Status: She is alert.       Medical problems and test  results were reviewed with the patient today.     No results found for any visits on 05/24/21.  Lab Results   Component Value Date/Time    NA 141 01/09/2021 01:27 PM    K 4.4 01/09/2021 01:27 PM    CL 109 01/09/2021 01:27 PM    CO2 27 01/09/2021 01:27 PM    BUN 17 01/09/2021 01:27 PM    GFRAA >60 01/09/2021 01:27 PM     Lab Results   Component Value Date/Time    CHOL 226 01/09/2021 01:27 PM    HDL 68 01/09/2021 01:27 PM     Monitor shows brief atrial tachycardia we went back over that.  Calcium score is 0.    ASSESSMENT and PLAN    Leslie Lucas was seen today for palpitations.    Diagnoses and all orders for this visit:    Atrial tachycardia (HCC) I will prescribe a little bit of Toprol-XL 25 see how she does with that.  She is very active and walks and has really no problem with exercise.  He does seem to have some anxiety in certainly can see her family doctor about some sort of medication for that.  Let me know how she does with the Toprol.    Other orders  -     metoprolol succinate (TOPROL XL) 25 MG extended release tablet; Take 1 tablet by mouth daily      @ASSESSTEXT @      No follow-up provider specified.    Keeghan Mcintire , MD  05/24/2021  11:27 AM

## 2021-06-18 ENCOUNTER — Encounter: Payer: Self-pay | Admitting: Family Medicine

## 2021-06-19 ENCOUNTER — Other Ambulatory Visit: Payer: Self-pay | Admitting: Family Medicine

## 2021-06-19 DIAGNOSIS — E7800 Pure hypercholesterolemia, unspecified: Secondary | ICD-10-CM

## 2021-06-19 DIAGNOSIS — R748 Abnormal levels of other serum enzymes: Secondary | ICD-10-CM

## 2021-06-19 DIAGNOSIS — R739 Hyperglycemia, unspecified: Secondary | ICD-10-CM

## 2021-06-24 ENCOUNTER — Other Ambulatory Visit: Payer: Self-pay | Admitting: Family Medicine

## 2021-06-24 DIAGNOSIS — Z1231 Encounter for screening mammogram for malignant neoplasm of breast: Secondary | ICD-10-CM

## 2021-06-26 LAB — COMPREHENSIVE METABOLIC PANEL
ALT: 12 IU/L (ref 0–32)
AST: 17 IU/L (ref 0–40)
Albumin/Globulin Ratio: 2.7 — ABNORMAL HIGH (ref 1.2–2.2)
Albumin: 4.9 g/dL (ref 3.8–4.9)
Alkaline Phosphatase: 95 IU/L (ref 44–121)
BUN/Creatinine Ratio: 14 (ref 9–23)
BUN: 11 mg/dL (ref 6–24)
Bilirubin Total: 0.3 mg/dL (ref 0.0–1.2)
CO2: 26 mmol/L (ref 20–29)
Calcium: 9.6 mg/dL (ref 8.7–10.2)
Chloride: 99 mmol/L (ref 96–106)
Creatinine, Ser: 0.79 mg/dL (ref 0.57–1.00)
Globulin, Total: 1.8 g/dL (ref 1.5–4.5)
Glucose: 93 mg/dL (ref 70–99)
Potassium: 3.8 mmol/L (ref 3.5–5.2)
Sodium: 141 mmol/L (ref 134–144)
Total Protein: 6.7 g/dL (ref 6.0–8.5)
eGFR: 86 mL/min/{1.73_m2} (ref >59)

## 2021-06-26 LAB — LIPID PANEL
Chol/HDL Ratio: 3 ratio (ref 0.0–4.4)
Cholesterol, Total: 213 mg/dL — ABNORMAL HIGH (ref 100–199)
HDL: 71 mg/dL (ref >39)
LDL Chol Calc (NIH): 125 mg/dL — ABNORMAL HIGH (ref 0–99)
Triglycerides: 97 mg/dL (ref 0–149)
VLDL Cholesterol Cal: 17 mg/dL (ref 5–40)

## 2021-06-26 LAB — HEMOGLOBIN A1C
Est. average glucose Bld gHb Est-mCnc: 111 mg/dL
Hgb A1c MFr Bld: 5.5 % (ref 4.8–5.6)

## 2021-06-26 LAB — VITAMIN B12: Vitamin B-12: 688 pg/mL (ref 232–1245)

## 2021-06-27 NOTE — Progress Notes (Signed)
Complete physical exam   Patient: Jordan Baker   DOB: Nov 26, 1960   60 y.o. Female  MRN: [id number] Visit Date: 06/28/2021  Today's healthcare provider: Gwyneth Sprout, FNP   Chief Complaint  Patient presents with   Annual Exam   Subjective     HPI  Jordan Baker is a 60 y.o. female who presents today for a complete physical exam.  She reports consuming a general diet. Home exercise routine includes walking. She generally feels fairly well. She reports sleeping poorly, patient reports difficulty staying asleep. She does have additional problems to discuss today, patient reports GI issues.  Last Reported Mammo- scheduled 07/23/2021 Pap-06/23/2019 Colonoscopy- 06/25/18 Past Medical History:  Diagnosis Date   Anxiety    GERD (gastroesophageal reflux disease)    Hyperlipidemia    Hypothyroidism    Past Surgical History:  Procedure Laterality Date   BIOPSY RECTAL     CESAREAN SECTION  1990/1993   x2   COLONOSCOPY WITH PROPOFOL N/A 06/25/2018   Procedure: COLONOSCOPY WITH PROPOFOL;  Surgeon: [name], MD;  Location: [facility] ENDOSCOPY;  Service: Gastroenterology;  Laterality: N/A;   ESOPHAGOGASTRODUODENOSCOPY (EGD) WITH PROPOFOL N/A 06/25/2018   Procedure: ESOPHAGOGASTRODUODENOSCOPY (EGD) WITH PROPOFOL;  Surgeon: [name], MD;  Location: [facility] ENDOSCOPY;  Service: Gastroenterology;  Laterality: N/A;   EUS N/A 05/17/2015   Procedure: LOWER ENDOSCOPIC ULTRASOUND (EUS);  Surgeon: Holly Bodily, MD;  Location: [facility] ENDOSCOPY;  Service: Gastroenterology;  Laterality: N/A;   JOINT REPLACEMENT Left 06/2017   OVARIAN CYST SURGERY  1993   partial knee replacemt Left 06/24/2017   Social History   Socioeconomic History   Marital status: Married    Spouse name: Not on file   Number of children: 2   Years of education: Not on file   Highest education level: Not on file  Occupational History    Employer: [employer]  Tobacco Use   Smoking status:  Never   Smokeless tobacco: Never  Vaping Use   Vaping Use: Never used  Substance and Sexual Activity   Alcohol use: Yes    Alcohol/week: 0.0 - 1.0 standard drinks    Comment: Occasional   Drug use: No   Sexual activity: Not on file  Other Topics Concern   Not on file  Social History Narrative   Not on file   Social Determinants of Health   Financial Resource Strain: Not on file  Food Insecurity: Not on file  Transportation Needs: Not on file  Physical Activity: Not on file  Stress: Not on file  Social Connections: Not on file  Intimate Partner Violence: Not on file   Family Status  Relation Name Status   PGM  Deceased at age 37       died from old age   Mother  44   Father  Deceased at age 84       Lung cancer   Sister 48 Alive   MGM  Deceased at age 32       died from old age   MGF  Deceased at age 72's       died from [place] heart failure.   PGF  Deceased at age 108       died from old age   Sister 30 Alive   Son  Alive   Son  Alive   Family History  Problem Relation Age of Onset   Breast cancer Paternal Grandmother    Cancer Paternal Grandmother  Breast Cancer   Hyperlipidemia Mother    Hypertension Mother    Hypertension Sister    Arthritis Maternal Grandfather    Allergies  Allergen Reactions   Augmentin [Amoxicillin-Pot Clavulanate] Rash   Nitrofurantoin Monohyd Macro Rash    Patient Care Team: Gwyneth Sprout, FNP as PCP - General (Family Medicine)   Medications: Outpatient Medications Prior to Visit  Medication Sig   APPLE CIDER VINEGAR PO Take by mouth. gummies   Lactobacillus-Inulin (PROBIOTIC DIGESTIVE SUPPORT PO) Take by mouth. Raw probiotic Probiotic and preprobiotic   MAGNESIUM PO Take 400 mg by mouth at bedtime.   Multiple Vitamins-Minerals (ZINC PO) Take 50 mg by mouth daily.   NON FORMULARY CPAP (Device) nightly   NON FORMULARY CBD oil   Psyllium (EQ DAILY FIBER) 400 MG CAPS Take by mouth.   VITAMIN D PO Take 1,000 Int'l  Units/day by mouth.   VITAMIN E PO Take 180 mg by mouth daily.   [DISCONTINUED] ALPRAZolam (XANAX) 0.5 MG tablet Take 1 tablet (0.5 mg total) by mouth daily as needed for anxiety.   [DISCONTINUED] amitriptyline (ELAVIL) 25 MG tablet Take 1 tablet (25 mg total) by mouth at bedtime.   [DISCONTINUED] amLODipine (NORVASC) 5 MG tablet Take 1 tablet by mouth once daily   [DISCONTINUED] cetirizine (ZYRTEC) 10 MG tablet Take by mouth.   [DISCONTINUED] clobetasol (TEMOVATE) 0.05 % external solution APPLY AS DIRECTED ONCE TO TWICE DAILY AS NEEDED. AVOID FACE, GROIN, AND AXILLA   [DISCONTINUED] cyclobenzaprine (FLEXERIL) 10 MG tablet Take 1 tablet by mouth three times daily as needed for muscle spasm   [DISCONTINUED] diclofenac Sodium (VOLTAREN) 1 % GEL    [DISCONTINUED] doxycycline (VIBRA-TABS) 100 MG tablet Take 1 tablet (100 mg total) by mouth 2 (two) times daily as needed.   [DISCONTINUED] fluticasone (FLONASE) 50 MCG/ACT nasal spray Use 2 spray(s) in each nostril once daily   [DISCONTINUED] levothyroxine (SYNTHROID) 100 MCG tablet Take 1 tablet by mouth once daily   [DISCONTINUED] pravastatin (PRAVACHOL) 40 MG tablet Take 1 tablet (40 mg total) by mouth daily.   [DISCONTINUED] sertraline (ZOLOFT) 50 MG tablet Take 1 tablet (50 mg total) by mouth daily.   [DISCONTINUED] sucralfate (CARAFATE) 1 g tablet TAKE 1 TABLET BY MOUTH 4 TIMES DAILY WITH MEALS AND AT BEDTIME   No facility-administered medications prior to visit.    Review of Systems  All other systems reviewed and are negative.    Objective    BP 134/67    Pulse (!) 121    Resp 16    Ht 5\' 2"  (1.575 m)    Wt 151 lb 8 oz (68.7 kg)    LMP 06/20/2018 Comment: no urine pregnancy needed per Dr. Amie Critchley   SpO2 100%    BMI 27.71 kg/m    Physical Exam Vitals and nursing note reviewed.  Constitutional:      General: She is awake. She is not in acute distress.    Appearance: Normal appearance. She is well-developed, well-groomed and  overweight. She is not ill-appearing, toxic-appearing or diaphoretic.  HENT:     Head: Normocephalic and atraumatic.     Jaw: There is normal jaw occlusion. No trismus, tenderness, swelling or pain on movement.     Right Ear: Hearing, tympanic membrane, ear canal and external ear normal. There is no impacted cerumen.     Left Ear: Hearing, tympanic membrane, ear canal and external ear normal. There is no impacted cerumen.     Nose: Nose normal. No congestion or  rhinorrhea.     Right Turbinates: Not enlarged, swollen or pale.     Left Turbinates: Not enlarged, swollen or pale.     Right Sinus: No maxillary sinus tenderness or frontal sinus tenderness.     Left Sinus: No maxillary sinus tenderness or frontal sinus tenderness.     Mouth/Throat:     Lips: Pink.     Mouth: Mucous membranes are moist. No injury.     Tongue: No lesions.     Pharynx: Oropharynx is clear. Uvula midline. No pharyngeal swelling, oropharyngeal exudate, posterior oropharyngeal erythema or uvula swelling.     Tonsils: No tonsillar exudate or tonsillar abscesses.  Eyes:     General: Lids are normal. Lids are everted, no foreign bodies appreciated. Vision grossly intact. Gaze aligned appropriately. No allergic shiner or visual field deficit.       Right eye: No discharge.        Left eye: No discharge.     Extraocular Movements: Extraocular movements intact.     Conjunctiva/sclera: Conjunctivae normal.     Right eye: Right conjunctiva is not injected. No exudate.    Left eye: Left conjunctiva is not injected. No exudate.    Pupils: Pupils are equal, round, and reactive to light.  Neck:     Thyroid: No thyroid mass, thyromegaly or thyroid tenderness.     Vascular: No carotid bruit.     Trachea: Trachea normal.  Cardiovascular:     Rate and Rhythm: Regular rhythm. Tachycardia present.     Pulses: Normal pulses.          Carotid pulses are 2+ on the right side and 2+ on the left side.      Radial pulses are 2+ on the  right side and 2+ on the left side.       Dorsalis pedis pulses are 2+ on the right side and 2+ on the left side.       Posterior tibial pulses are 2+ on the right side and 2+ on the left side.     Heart sounds: Normal heart sounds, S1 normal and S2 normal. No murmur heard.   No friction rub. No gallop.  Pulmonary:     Effort: Pulmonary effort is normal. No respiratory distress.     Breath sounds: Normal breath sounds and air entry. No stridor. No wheezing, rhonchi or rales.  Chest:     Chest wall: No tenderness.     Comments: Breasts: breasts appear normal, no suspicious masses, no skin or nipple changes or axillary nodes, right breast normal without mass, skin or nipple changes or axillary nodes, left breast normal without mass, skin or nipple changes or axillary nodes, risk and benefit of breast self-exam was discussed  Abdominal:     General: Abdomen is flat. Bowel sounds are normal. There is no distension.     Palpations: Abdomen is soft. There is no mass.     Tenderness: There is no abdominal tenderness. There is no right CVA tenderness, left CVA tenderness, guarding or rebound.     Hernia: No hernia is present.  Genitourinary:    Comments: Exam deferred; denies complaints Musculoskeletal:        General: No swelling, tenderness, deformity or signs of injury. Normal range of motion.     Cervical back: Full passive range of motion without pain, normal range of motion and neck supple. No edema, rigidity or tenderness. No muscular tenderness.     Right lower leg: No edema.  Left lower leg: No edema.  Lymphadenopathy:     Cervical: No cervical adenopathy.     Right cervical: No superficial, deep or posterior cervical adenopathy.    Left cervical: No superficial, deep or posterior cervical adenopathy.  Skin:    General: Skin is warm and dry.     Capillary Refill: Capillary refill takes less than 2 seconds.     Coloration: Skin is not jaundiced or pale.     Findings: No bruising,  erythema, lesion or rash.  Neurological:     General: No focal deficit present.     Mental Status: She is alert and oriented to person, place, and time. Mental status is at baseline.     GCS: GCS eye subscore is 4. GCS verbal subscore is 5. GCS motor subscore is 6.     Sensory: Sensation is intact. No sensory deficit.     Motor: Motor function is intact. No weakness.     Coordination: Coordination is intact. Coordination normal.     Gait: Gait is intact. Gait normal.  Psychiatric:        Attention and Perception: Attention and perception normal.        Mood and Affect: Mood and affect normal.        Speech: Speech normal.        Behavior: Behavior normal. Behavior is cooperative.        Thought Content: Thought content normal.        Cognition and Memory: Cognition and memory normal.        Judgment: Judgment normal.      Last depression screening scores PHQ 2/9 Scores 03/20/2021 06/27/2020 06/23/2019  PHQ - 2 Score 3 1 0  PHQ- 9 Score 8 - 0   Last fall risk screening Fall Risk  03/20/2021  Falls in the past year? 0  Number falls in past yr: 0  Injury with Fall? 0  Risk for fall due to : No Fall Risks   Last Audit-C alcohol use screening Alcohol Use Disorder Test (AUDIT) 03/20/2021  1. How often do you have a drink containing alcohol? 2  2. How many drinks containing alcohol do you have on a typical day when you are drinking? 0  3. How often do you have six or more drinks on one occasion? 0  AUDIT-C Score 2  4. How often during the last year have you found that you were not able to stop drinking once you had started? -  5. How often during the last year have you failed to do what was normally expected from you because of drinking? -  6. How often during the last year have you needed a first drink in the morning to get yourself going after a heavy drinking session? -  7. How often during the last year have you had a feeling of guilt of remorse after drinking? -  8. How often during  the last year have you been unable to remember what happened the night before because you had been drinking? -  9. Have you or someone else been injured as a result of your drinking? -  10. Has a relative or friend or a doctor or another health worker been concerned about your drinking or suggested you cut down? -  Alcohol Use Disorder Identification Test Final Score (AUDIT) -   A score of 3 or more in women, and 4 or more in men indicates increased risk for alcohol abuse, EXCEPT if all of the  points are from question 1   No results found for any visits on 06/28/21.  Assessment & Plan    Routine Health Maintenance and Physical Exam  Exercise Activities and Dietary recommendations  Goals   None    Immunization History  Administered Date(s) Administered   Influenza, High Dose Seasonal PF 07/26/2020   Influenza-Unspecified 03/22/2018   Janssen (J&J) SARS-COV-2 Vaccination 07/26/2020   Td 06/02/2016   Tdap 10/21/2005   Zoster Recombinat (Shingrix) 06/28/2021    Health Maintenance  Topic Date Due   COVID-19 Vaccine (2 - Booster for Janssen series) 09/20/2020   INFLUENZA VACCINE  10/12/2021 (Originally 02/11/2021)   Zoster Vaccines- Shingrix (2 of 2) 08/23/2021   PAP SMEAR-Modifier  06/22/2022   MAMMOGRAM  07/20/2022   COLONOSCOPY (Pts 45-72yrs Insurance coverage will need to be confirmed)  06/26/2023   TETANUS/TDAP  06/02/2026   Hepatitis C Screening  Completed   HIV Screening  Completed   Pneumococcal Vaccine 33-12 Years old  Aged Out   HPV VACCINES  Aged Out    Discussed health benefits of physical activity, and encouraged her to engage in regular exercise appropriate for her age and condition.  Problem List Items Addressed This Visit       Cardiovascular and Mediastinum   Hot flash, menopausal    Worsening from 2am on to mid morning and then from 3pm on until early evening Chronic dry mouth, dry eyes, drinks 60-100 oz of water/day      Relevant Medications    amLODipine (NORVASC) 5 MG tablet   pravastatin (PRAVACHOL) 40 MG tablet   Benign essential HTN    Chronic, stable Refill of norvasc No LE edema, denies CP, SOB      Relevant Medications   amLODipine (NORVASC) 5 MG tablet   pravastatin (PRAVACHOL) 40 MG tablet     Respiratory   Chronic seasonal allergic rhinitis due to pollen    Refill of medication Stable at this time      Relevant Medications   cetirizine (ZYRTEC) 10 MG tablet   fluticasone (FLONASE) 50 MCG/ACT nasal spray     Digestive   GERD (gastroesophageal reflux disease)    Consulted GI- who declined scope for EGD at this time as pt is not due Encouraged to try carafate and reglan in addition to diet modification      Relevant Medications   metoCLOPramide (REGLAN) 5 MG tablet   sucralfate (CARAFATE) 1 GM/10ML suspension   Dry mouth    Encouraged to try humidification through CPAP as well as use of chin strap Has nasal CPAP; doing well- no apnea, however, chronic dry mouth Has been using biotene         Endocrine   Hypothyroidism    Medication refill- no hair loss noted      Relevant Medications   levothyroxine (SYNTHROID) 100 MCG tablet     Other   Anxiety disorder    rec increase of SSRI to 100 mg and trial of am dosing given slight insomnia worsening Encouraged to seek therapy/counseling       Relevant Medications   amitriptyline (ELAVIL) 25 MG tablet   sertraline (ZOLOFT) 100 MG tablet   ALPRAZolam (XANAX) 0.5 MG tablet   Hypercholesterolemia    Improvement in lipid recommend diet low in saturated fat and regular exercise - 30 min at least 5 times per week       Relevant Medications   amLODipine (NORVASC) 5 MG tablet   pravastatin (PRAVACHOL) 40 MG tablet  Nerve pain    Continue to use elevil qHS for sleep assistance      Relevant Medications   amitriptyline (ELAVIL) 25 MG tablet   Annual physical exam - Primary    Things to do to keep yourself healthy  - Exercise at least 30-45  minutes a day, 3-4 days a week.  - Eat a low-fat diet with lots of fruits and vegetables, up to 7-9 servings per day.  - Seatbelts can save your life. Wear them always.  - Smoke detectors on every level of your home, check batteries every year.  - Eye Doctor - have an eye exam every 1-2 years  - Safe sex - if you may be exposed to STDs, use a condom.  - Alcohol -  If you drink, do it moderately, less than 2 drinks per day.  - Sebastian. Choose someone to speak for you if you are not able.  - Depression is common in our stressful world.If you're feeling down or losing interest in things you normally enjoy, please come in for a visit.  - Violence - If anyone is threatening or hurting you, please call immediately.        Muscle spasm of back    Pulled for medication refill; use of voltaren and flexeril       Relevant Medications   cyclobenzaprine (FLEXERIL) 10 MG tablet   diclofenac Sodium (VOLTAREN) 1 % GEL   Need for shingles vaccine    1/2 provided today      Relevant Medications   Zoster Vaccine Adjuvanted [facility]) injection   Dry eyes    Stable per last eye visit; denies any recent changes extending beyond dry eye      Other Visit Diagnoses     Slow transit constipation       Relevant Medications   metoCLOPramide (REGLAN) 5 MG tablet   Dermatitis       Relevant Medications   clobetasol (TEMOVATE) 0.05 % external solution   Need for zoster vaccination       Relevant Orders   Varicella-zoster vaccine IM (Completed)        Return if symptoms worsen or fail to improve- abdominal pain.     Jordan Kotyk, FNP, have reviewed all documentation for this visit. The documentation on 06/28/21 for the exam, diagnosis, procedures, and orders are all accurate and complete.    Gwyneth Sprout, [place] [phone] (phone) [phone] (fax)  [place]

## 2021-06-28 ENCOUNTER — Encounter: Payer: Managed Care, Other (non HMO) | Admitting: Family Medicine

## 2021-06-28 ENCOUNTER — Encounter: Payer: Self-pay | Admitting: Family Medicine

## 2021-06-28 ENCOUNTER — Ambulatory Visit (INDEPENDENT_AMBULATORY_CARE_PROVIDER_SITE_OTHER): Payer: Managed Care, Other (non HMO) | Admitting: Family Medicine

## 2021-06-28 ENCOUNTER — Other Ambulatory Visit: Payer: Self-pay

## 2021-06-28 ENCOUNTER — Ambulatory Visit: Payer: Self-pay | Admitting: Family Medicine

## 2021-06-28 VITALS — BP 134/67 | HR 121 | Resp 16 | Ht 62.0 in | Wt 151.5 lb

## 2021-06-28 DIAGNOSIS — Z0000 Encounter for general adult medical examination without abnormal findings: Secondary | ICD-10-CM | POA: Diagnosis not present

## 2021-06-28 DIAGNOSIS — H04123 Dry eye syndrome of bilateral lacrimal glands: Secondary | ICD-10-CM | POA: Insufficient documentation

## 2021-06-28 DIAGNOSIS — E034 Atrophy of thyroid (acquired): Secondary | ICD-10-CM | POA: Diagnosis not present

## 2021-06-28 DIAGNOSIS — L309 Dermatitis, unspecified: Secondary | ICD-10-CM

## 2021-06-28 DIAGNOSIS — N951 Menopausal and female climacteric states: Secondary | ICD-10-CM

## 2021-06-28 DIAGNOSIS — M792 Neuralgia and neuritis, unspecified: Secondary | ICD-10-CM | POA: Diagnosis not present

## 2021-06-28 DIAGNOSIS — Z23 Encounter for immunization: Secondary | ICD-10-CM | POA: Insufficient documentation

## 2021-06-28 DIAGNOSIS — I10 Essential (primary) hypertension: Secondary | ICD-10-CM | POA: Diagnosis not present

## 2021-06-28 DIAGNOSIS — K219 Gastro-esophageal reflux disease without esophagitis: Secondary | ICD-10-CM

## 2021-06-28 DIAGNOSIS — K5901 Slow transit constipation: Secondary | ICD-10-CM

## 2021-06-28 DIAGNOSIS — M62830 Muscle spasm of back: Secondary | ICD-10-CM | POA: Insufficient documentation

## 2021-06-28 DIAGNOSIS — F411 Generalized anxiety disorder: Secondary | ICD-10-CM | POA: Diagnosis not present

## 2021-06-28 DIAGNOSIS — R682 Dry mouth, unspecified: Secondary | ICD-10-CM | POA: Diagnosis not present

## 2021-06-28 DIAGNOSIS — E7800 Pure hypercholesterolemia, unspecified: Secondary | ICD-10-CM

## 2021-06-28 DIAGNOSIS — J301 Allergic rhinitis due to pollen: Secondary | ICD-10-CM | POA: Diagnosis not present

## 2021-06-28 MED ORDER — METOCLOPRAMIDE HCL 5 MG PO TABS: 5.0000 mg | ORAL_TABLET | Freq: Three times a day (TID) | ORAL | 1 refills | Status: DC

## 2021-06-28 MED ORDER — DICLOFENAC SODIUM 1 % EX GEL: 4.0000 g | Freq: Four times a day (QID) | CUTANEOUS | 11 refills | Status: DC

## 2021-06-28 MED ORDER — SERTRALINE HCL 100 MG PO TABS: 100.0000 mg | ORAL_TABLET | Freq: Every day | ORAL | 3 refills | Status: DC

## 2021-06-28 MED ORDER — CETIRIZINE HCL 10 MG PO TABS: 10.0000 mg | ORAL_TABLET | Freq: Every day | ORAL | 3 refills | Status: DC

## 2021-06-28 MED ORDER — CYCLOBENZAPRINE HCL 10 MG PO TABS: 10.0000 mg | ORAL_TABLET | Freq: Three times a day (TID) | ORAL | 3 refills | Status: DC | PRN

## 2021-06-28 MED ORDER — ALPRAZOLAM 0.5 MG PO TABS: 0.5000 mg | ORAL_TABLET | Freq: Every day | ORAL | 0 refills | Status: DC | PRN

## 2021-06-28 MED ORDER — FLUTICASONE PROPIONATE 50 MCG/ACT NA SUSP: NASAL | 3 refills | Status: DC

## 2021-06-28 MED ORDER — LEVOTHYROXINE SODIUM 100 MCG PO TABS: 100.0000 ug | ORAL_TABLET | Freq: Every day | ORAL | 3 refills | Status: DC

## 2021-06-28 MED ORDER — AMITRIPTYLINE HCL 25 MG PO TABS: 25.0000 mg | ORAL_TABLET | Freq: Every day | ORAL | 3 refills | Status: DC

## 2021-06-28 MED ORDER — AMLODIPINE BESYLATE 5 MG PO TABS
5.0000 mg | ORAL_TABLET | Freq: Every day | ORAL | 3 refills | Status: DC
Start: 2021-06-28 — End: 2021-12-31

## 2021-06-28 MED ORDER — ZOSTER VAC RECOMB ADJUVANTED 50 MCG/0.5ML IM SUSR: 0.5000 mL | Freq: Once | INTRAMUSCULAR | 0 refills | Status: AC

## 2021-06-28 MED ORDER — SUCRALFATE 1 GM/10ML PO SUSP: 1.0000 g | Freq: Three times a day (TID) | ORAL | 1 refills | Status: DC

## 2021-06-28 MED ORDER — CLOBETASOL PROPIONATE 0.05 % EX SOLN
Freq: Two times a day (BID) | CUTANEOUS | 11 refills | Status: DC
Start: 2021-06-28 — End: 2021-12-31

## 2021-06-28 MED ORDER — PRAVASTATIN SODIUM 40 MG PO TABS: 40.0000 mg | ORAL_TABLET | Freq: Every day | ORAL | 3 refills | Status: DC

## 2021-06-28 NOTE — Assessment & Plan Note (Signed)
Encouraged to try humidification through CPAP as well as use of chin strap Has nasal CPAP; doing well- no apnea, however, chronic dry mouth Has been using biotene

## 2021-06-28 NOTE — Assessment & Plan Note (Signed)
1/2 provided today

## 2021-06-28 NOTE — Assessment & Plan Note (Signed)
Medication refill- no hair loss noted

## 2021-06-28 NOTE — Assessment & Plan Note (Signed)
Improvement in lipid recommend diet low in saturated fat and regular exercise - 30 min at least 5 times per week

## 2021-06-28 NOTE — Assessment & Plan Note (Signed)
Pulled for medication refill; use of voltaren and flexeril

## 2021-06-28 NOTE — Assessment & Plan Note (Signed)

## 2021-06-28 NOTE — Assessment & Plan Note (Signed)
Stable per last eye visit; denies any recent changes extending beyond dry eye

## 2021-06-28 NOTE — Progress Notes (Deleted)
Complete physical exam   Patient: Jordan Baker   DOB: 10/31/1960   60 y.o. Female  MRN: [id number] Visit Date: 06/28/2021  Today's healthcare provider: Gwyneth Sprout, FNP   Chief Complaint  Patient presents with   Annual Exam   Subjective     HPI  Kyleah Pensabene is a 60 y.o. female who presents today for a complete physical exam.  She reports consuming a general diet. Home exercise routine includes walking. She generally feels fairly well. She reports sleeping poorly, patient reports difficulty staying asleep. She does have additional problems to discuss today, patient reports GI issues.  Last Reported Mammo- scheduled 07/23/2021 Pap-06/23/2019 Colonoscopy- 06/25/18 Past Medical History:  Diagnosis Date   Anxiety    GERD (gastroesophageal reflux disease)    Hyperlipidemia    Hypothyroidism    Past Surgical History:  Procedure Laterality Date   BIOPSY RECTAL     CESAREAN SECTION  1990/1993   x2   COLONOSCOPY WITH PROPOFOL N/A 06/25/2018   Procedure: COLONOSCOPY WITH PROPOFOL;  Surgeon: [name], MD;  Location: [facility] ENDOSCOPY;  Service: Gastroenterology;  Laterality: N/A;   ESOPHAGOGASTRODUODENOSCOPY (EGD) WITH PROPOFOL N/A 06/25/2018   Procedure: ESOPHAGOGASTRODUODENOSCOPY (EGD) WITH PROPOFOL;  Surgeon: [name], MD;  Location[facility] ENDOSCOPY;  Service: Gastroenterology;  Laterality: N/A;   EUS N/A 05/17/2015   Procedure: LOWER ENDOSCOPIC ULTRASOUND (EUS);  Surgeon: Holly Bodily, MD;  Location: [facility] ENDOSCOPY;  Service: Gastroenterology;  Laterality: N/A;   JOINT REPLACEMENT Left 06/2017   OVARIAN CYST SURGERY  1993   partial knee replacemt Left 06/24/2017   Social History   Socioeconomic History   Marital status: Married    Spouse name: Not on file   Number of children: 2   Years of education: Not on file   Highest education level: Not on file  Occupational History    Employer: [employer]  Tobacco Use   Smoking status:  Never   Smokeless tobacco: Never  Vaping Use   Vaping Use: Never used  Substance and Sexual Activity   Alcohol use: Yes    Alcohol/week: 0.0 - 1.0 standard drinks    Comment: Occasional   Drug use: No   Sexual activity: Not on file  Other Topics Concern   Not on file  Social History Narrative   Not on file   Social Determinants of Health   Financial Resource Strain: Not on file  Food Insecurity: Not on file  Transportation Needs: Not on file  Physical Activity: Not on file  Stress: Not on file  Social Connections: Not on file  Intimate Partner Violence: Not on file   Family Status  Relation Name Status   PGM  Deceased at age 59       died from old age   Mother  60   Father  Deceased at age 51       Lung cancer   Sister 13 Alive   MGM  Deceased at age 44       died from old age   MGF  Deceased at age 49's       died from [place] heart failure.   PGF  Deceased at age 67       died from old age   Sister 21 Alive   Son  Alive   Son  Alive   Family History  Problem Relation Age of Onset   Breast cancer Paternal Grandmother    Cancer Paternal Grandmother  Breast Cancer   Hyperlipidemia Mother    Hypertension Mother    Hypertension Sister    Arthritis Maternal Grandfather    Allergies  Allergen Reactions   Augmentin [Amoxicillin-Pot Clavulanate] Rash   Nitrofurantoin Monohyd Macro Rash    Patient Care Team: Gwyneth Sprout, FNP as PCP - General (Family Medicine)   Medications: Outpatient Medications Prior to Visit  Medication Sig   APPLE CIDER VINEGAR PO Take by mouth. gummies   Lactobacillus-Inulin (PROBIOTIC DIGESTIVE SUPPORT PO) Take by mouth. Raw probiotic Probiotic and preprobiotic   MAGNESIUM PO Take 400 mg by mouth at bedtime.   Multiple Vitamins-Minerals (ZINC PO) Take 50 mg by mouth daily.   NON FORMULARY CPAP (Device) nightly   NON FORMULARY CBD oil   Psyllium (EQ DAILY FIBER) 400 MG CAPS Take by mouth.   VITAMIN D PO Take 1,000 Int'l  Units/day by mouth.   VITAMIN E PO Take 180 mg by mouth daily.   [DISCONTINUED] ALPRAZolam (XANAX) 0.5 MG tablet Take 1 tablet (0.5 mg total) by mouth daily as needed for anxiety.   [DISCONTINUED] amitriptyline (ELAVIL) 25 MG tablet Take 1 tablet (25 mg total) by mouth at bedtime.   [DISCONTINUED] amLODipine (NORVASC) 5 MG tablet Take 1 tablet by mouth once daily   [DISCONTINUED] cetirizine (ZYRTEC) 10 MG tablet Take by mouth.   [DISCONTINUED] clobetasol (TEMOVATE) 0.05 % external solution APPLY AS DIRECTED ONCE TO TWICE DAILY AS NEEDED. AVOID FACE, GROIN, AND AXILLA   [DISCONTINUED] cyclobenzaprine (FLEXERIL) 10 MG tablet Take 1 tablet by mouth three times daily as needed for muscle spasm   [DISCONTINUED] diclofenac Sodium (VOLTAREN) 1 % GEL    [DISCONTINUED] doxycycline (VIBRA-TABS) 100 MG tablet Take 1 tablet (100 mg total) by mouth 2 (two) times daily as needed.   [DISCONTINUED] fluticasone (FLONASE) 50 MCG/ACT nasal spray Use 2 spray(s) in each nostril once daily   [DISCONTINUED] levothyroxine (SYNTHROID) 100 MCG tablet Take 1 tablet by mouth once daily   [DISCONTINUED] pravastatin (PRAVACHOL) 40 MG tablet Take 1 tablet (40 mg total) by mouth daily.   [DISCONTINUED] sertraline (ZOLOFT) 50 MG tablet Take 1 tablet (50 mg total) by mouth daily.   [DISCONTINUED] sucralfate (CARAFATE) 1 g tablet TAKE 1 TABLET BY MOUTH 4 TIMES DAILY WITH MEALS AND AT BEDTIME   No facility-administered medications prior to visit.    Review of Systems  All other systems reviewed and are negative.    Objective    BP 134/67    Pulse (!) 121    Resp 16    Ht 5\' 2"  (1.575 m)    Wt 151 lb 8 oz (68.7 kg)    LMP 06/20/2018 Comment: no urine pregnancy needed per Dr. Amie Critchley   SpO2 100%    BMI 27.71 kg/m    Physical Exam Vitals and nursing note reviewed.  Constitutional:      General: She is awake. She is not in acute distress.    Appearance: Normal appearance. She is well-developed, well-groomed and  overweight. She is not ill-appearing, toxic-appearing or diaphoretic.  HENT:     Head: Normocephalic and atraumatic.     Jaw: There is normal jaw occlusion. No trismus, tenderness, swelling or pain on movement.     Right Ear: Hearing, tympanic membrane, ear canal and external ear normal. There is no impacted cerumen.     Left Ear: Hearing, tympanic membrane, ear canal and external ear normal. There is no impacted cerumen.     Nose: Nose normal. No congestion or  rhinorrhea.     Right Turbinates: Not enlarged, swollen or pale.     Left Turbinates: Not enlarged, swollen or pale.     Right Sinus: No maxillary sinus tenderness or frontal sinus tenderness.     Left Sinus: No maxillary sinus tenderness or frontal sinus tenderness.     Mouth/Throat:     Lips: Pink.     Mouth: Mucous membranes are moist. No injury.     Tongue: No lesions.     Pharynx: Oropharynx is clear. Uvula midline. No pharyngeal swelling, oropharyngeal exudate, posterior oropharyngeal erythema or uvula swelling.     Tonsils: No tonsillar exudate or tonsillar abscesses.  Eyes:     General: Lids are normal. Lids are everted, no foreign bodies appreciated. Vision grossly intact. Gaze aligned appropriately. No allergic shiner or visual field deficit.       Right eye: No discharge.        Left eye: No discharge.     Extraocular Movements: Extraocular movements intact.     Conjunctiva/sclera: Conjunctivae normal.     Right eye: Right conjunctiva is not injected. No exudate.    Left eye: Left conjunctiva is not injected. No exudate.    Pupils: Pupils are equal, round, and reactive to light.  Neck:     Thyroid: No thyroid mass, thyromegaly or thyroid tenderness.     Vascular: No carotid bruit.     Trachea: Trachea normal.  Cardiovascular:     Rate and Rhythm: Regular rhythm. Tachycardia present.     Pulses: Normal pulses.          Carotid pulses are 2+ on the right side and 2+ on the left side.      Radial pulses are 2+ on the  right side and 2+ on the left side.       Dorsalis pedis pulses are 2+ on the right side and 2+ on the left side.       Posterior tibial pulses are 2+ on the right side and 2+ on the left side.     Heart sounds: Normal heart sounds, S1 normal and S2 normal. No murmur heard.   No friction rub. No gallop.  Pulmonary:     Effort: Pulmonary effort is normal. No respiratory distress.     Breath sounds: Normal breath sounds and air entry. No stridor. No wheezing, rhonchi or rales.  Chest:     Chest wall: No tenderness.     Comments: Breasts: breasts appear normal, no suspicious masses, no skin or nipple changes or axillary nodes, right breast normal without mass, skin or nipple changes or axillary nodes, left breast normal without mass, skin or nipple changes or axillary nodes, risk and benefit of breast self-exam was discussed  Abdominal:     General: Abdomen is flat. Bowel sounds are normal. There is no distension.     Palpations: Abdomen is soft. There is no mass.     Tenderness: There is no abdominal tenderness. There is no right CVA tenderness, left CVA tenderness, guarding or rebound.     Hernia: No hernia is present.  Genitourinary:    Comments: Exam deferred; denies complaints Musculoskeletal:        General: No swelling, tenderness, deformity or signs of injury. Normal range of motion.     Cervical back: Full passive range of motion without pain, normal range of motion and neck supple. No edema, rigidity or tenderness. No muscular tenderness.     Right lower leg: No edema.  Left lower leg: No edema.  Lymphadenopathy:     Cervical: No cervical adenopathy.     Right cervical: No superficial, deep or posterior cervical adenopathy.    Left cervical: No superficial, deep or posterior cervical adenopathy.  Skin:    General: Skin is warm and dry.     Capillary Refill: Capillary refill takes less than 2 seconds.     Coloration: Skin is not jaundiced or pale.     Findings: No bruising,  erythema, lesion or rash.  Neurological:     General: No focal deficit present.     Mental Status: She is alert and oriented to person, place, and time. Mental status is at baseline.     GCS: GCS eye subscore is 4. GCS verbal subscore is 5. GCS motor subscore is 6.     Sensory: Sensation is intact. No sensory deficit.     Motor: Motor function is intact. No weakness.     Coordination: Coordination is intact. Coordination normal.     Gait: Gait is intact. Gait normal.  Psychiatric:        Attention and Perception: Attention and perception normal.        Mood and Affect: Mood and affect normal.        Speech: Speech normal.        Behavior: Behavior normal. Behavior is cooperative.        Thought Content: Thought content normal.        Cognition and Memory: Cognition and memory normal.        Judgment: Judgment normal.      Last depression screening scores PHQ 2/9 Scores 03/20/2021 06/27/2020 06/23/2019  PHQ - 2 Score 3 1 0  PHQ- 9 Score 8 - 0   Last fall risk screening Fall Risk  03/20/2021  Falls in the past year? 0  Number falls in past yr: 0  Injury with Fall? 0  Risk for fall due to : No Fall Risks   Last Audit-C alcohol use screening Alcohol Use Disorder Test (AUDIT) 03/20/2021  1. How often do you have a drink containing alcohol? 2  2. How many drinks containing alcohol do you have on a typical day when you are drinking? 0  3. How often do you have six or more drinks on one occasion? 0  AUDIT-C Score 2  4. How often during the last year have you found that you were not able to stop drinking once you had started? -  5. How often during the last year have you failed to do what was normally expected from you because of drinking? -  6. How often during the last year have you needed a first drink in the morning to get yourself going after a heavy drinking session? -  7. How often during the last year have you had a feeling of guilt of remorse after drinking? -  8. How often during  the last year have you been unable to remember what happened the night before because you had been drinking? -  9. Have you or someone else been injured as a result of your drinking? -  10. Has a relative or friend or a doctor or another health worker been concerned about your drinking or suggested you cut down? -  Alcohol Use Disorder Identification Test Final Score (AUDIT) -   A score of 3 or more in women, and 4 or more in men indicates increased risk for alcohol abuse, EXCEPT if all of the  points are from question 1   No results found for any visits on 06/28/21.  Assessment & Plan    Routine Health Maintenance and Physical Exam  Exercise Activities and Dietary recommendations  Goals   None    Immunization History  Administered Date(s) Administered   Influenza, High Dose Seasonal PF 07/26/2020   Influenza-Unspecified 03/22/2018   Janssen (J&J) SARS-COV-2 Vaccination 07/26/2020   Td 06/02/2016   Tdap 10/21/2005   Zoster Recombinat (Shingrix) 06/28/2021    Health Maintenance  Topic Date Due   COVID-19 Vaccine (2 - Booster for Janssen series) 09/20/2020   INFLUENZA VACCINE  10/12/2021 (Originally 02/11/2021)   Zoster Vaccines- Shingrix (2 of 2) 08/23/2021   PAP SMEAR-Modifier  06/22/2022   MAMMOGRAM  07/20/2022   COLONOSCOPY (Pts 45-54yrs Insurance coverage will need to be confirmed)  06/26/2023   TETANUS/TDAP  06/02/2026   Hepatitis C Screening  Completed   HIV Screening  Completed   Pneumococcal Vaccine 14-23 Years old  Aged Out   HPV VACCINES  Aged Out    Discussed health benefits of physical activity, and encouraged her to engage in regular exercise appropriate for her age and condition.  Problem List Items Addressed This Visit       Cardiovascular and Mediastinum   Hot flash, menopausal    Worsening from 2am on to mid morning and then from 3pm on until early evening Chronic dry mouth, dry eyes, drinks 60-100 oz of water/day      Relevant Medications    amLODipine (NORVASC) 5 MG tablet   pravastatin (PRAVACHOL) 40 MG tablet   Benign essential HTN    Chronic, stable Refill of norvasc No LE edema, denies CP, SOB      Relevant Medications   amLODipine (NORVASC) 5 MG tablet   pravastatin (PRAVACHOL) 40 MG tablet     Respiratory   Chronic seasonal allergic rhinitis due to pollen    Refill of medication Stable at this time      Relevant Medications   cetirizine (ZYRTEC) 10 MG tablet   fluticasone (FLONASE) 50 MCG/ACT nasal spray     Digestive   GERD (gastroesophageal reflux disease)    Consulted GI- who declined scope for EGD at this time as pt is not due Encouraged to try carafate and reglan in addition to diet modification      Relevant Medications   metoCLOPramide (REGLAN) 5 MG tablet   sucralfate (CARAFATE) 1 GM/10ML suspension   Dry mouth    Encouraged to try humidification through CPAP as well as use of chin strap Has nasal CPAP; doing well- no apnea, however, chronic dry mouth Has been using biotene         Endocrine   Hypothyroidism    Medication refill- no hair loss noted      Relevant Medications   levothyroxine (SYNTHROID) 100 MCG tablet     Other   Anxiety disorder    rec increase of SSRI to 100 mg and trial of am dosing given slight insomnia worsening Encouraged to seek therapy/counseling       Relevant Medications   amitriptyline (ELAVIL) 25 MG tablet   sertraline (ZOLOFT) 100 MG tablet   ALPRAZolam (XANAX) 0.5 MG tablet   Hypercholesterolemia    Improvement in lipid recommend diet low in saturated fat and regular exercise - 30 min at least 5 times per week       Relevant Medications   amLODipine (NORVASC) 5 MG tablet   pravastatin (PRAVACHOL) 40 MG tablet  Nerve pain    Continue to use elevil qHS for sleep assistance      Relevant Medications   amitriptyline (ELAVIL) 25 MG tablet   Annual physical exam - Primary    Things to do to keep yourself healthy  - Exercise at least 30-45  minutes a day, 3-4 days a week.  - Eat a low-fat diet with lots of fruits and vegetables, up to 7-9 servings per day.  - Seatbelts can save your life. Wear them always.  - Smoke detectors on every level of your home, check batteries every year.  - Eye Doctor - have an eye exam every 1-2 years  - Safe sex - if you may be exposed to STDs, use a condom.  - Alcohol -  If you drink, do it moderately, less than 2 drinks per day.  - [place]. Choose someone to speak for you if you are not able.  - Depression is common in our stressful world.If you're feeling down or losing interest in things you normally enjoy, please come in for a visit.  - Violence - If anyone is threatening or hurting you, please call immediately.        Muscle spasm of back    Pulled for medication refill; use of voltaren and flexeril       Relevant Medications   cyclobenzaprine (FLEXERIL) 10 MG tablet   diclofenac Sodium (VOLTAREN) 1 % GEL   Need for shingles vaccine    1/2 provided today      Relevant Medications   Zoster Vaccine Adjuvanted [facility]) injection   Dry eyes    Stable per last eye visit; denies any recent changes extending beyond dry eye      Other Visit Diagnoses     Slow transit constipation       Relevant Medications   metoCLOPramide (REGLAN) 5 MG tablet   Dermatitis       Relevant Medications   clobetasol (TEMOVATE) 0.05 % external solution   Need for zoster vaccination       Relevant Orders   Varicella-zoster vaccine IM (Completed)        Return if symptoms worsen or fail to improve- abdominal pain.     Vonna Kotyk, FNP, have reviewed all documentation for this visit. The documentation on 06/28/21 for the exam, diagnosis, procedures, and orders are all accurate and complete.    Gwyneth Sprout, [place] [phone] (phone) [phone] (fax)  [place]

## 2021-06-28 NOTE — Assessment & Plan Note (Signed)
Refill of medication Stable at this time

## 2021-06-28 NOTE — Assessment & Plan Note (Addendum)
Chronic, stable Refill of norvasc No LE edema, denies CP, SOB

## 2021-06-28 NOTE — Assessment & Plan Note (Signed)
Consulted GI- who declined scope for EGD at this time as pt is not due Encouraged to try carafate and reglan in addition to diet modification

## 2021-06-28 NOTE — Assessment & Plan Note (Signed)
rec increase of SSRI to 100 mg and trial of am dosing given slight insomnia worsening Encouraged to seek therapy/counseling

## 2021-06-28 NOTE — Assessment & Plan Note (Signed)
Continue to use elevil qHS for sleep assistance

## 2021-06-28 NOTE — Assessment & Plan Note (Signed)
Worsening from 2am on to mid morning and then from 3pm on until early evening Chronic dry mouth, dry eyes, drinks 60-100 oz of water/day

## 2021-07-24 ENCOUNTER — Other Ambulatory Visit: Payer: Self-pay

## 2021-07-24 ENCOUNTER — Ambulatory Visit
Admission: RE | Admit: 2021-07-24 | Discharge: 2021-07-24 | Disposition: A | Discharge status: Home / Self Care | Payer: Managed Care, Other (non HMO) | Source: Ambulatory Visit | Attending: Family Medicine | Admitting: Family Medicine

## 2021-07-24 DIAGNOSIS — Z1231 Encounter for screening mammogram for malignant neoplasm of breast: Secondary | ICD-10-CM | POA: Insufficient documentation

## 2021-08-07 ENCOUNTER — Ambulatory Visit
Admit: 2021-08-07 | Discharge: 2021-08-07 | Payer: BLUE CROSS/BLUE SHIELD | Attending: Internal Medicine | Primary: Internal Medicine

## 2021-08-07 ENCOUNTER — Encounter: Attending: Internal Medicine | Primary: Internal Medicine

## 2021-08-07 DIAGNOSIS — Z0000 Encounter for general adult medical examination without abnormal findings: Secondary | ICD-10-CM

## 2021-08-07 LAB — AMB POC URINALYSIS DIP STICK AUTO W/O MICRO
Bilirubin, Urine, POC: NEGATIVE
Glucose, Urine, POC: NEGATIVE
Leukocyte Esterase, Urine, POC: NEGATIVE
Nitrite, Urine, POC: NEGATIVE
Protein, Urine, POC: NEGATIVE
Specific Gravity, Urine, POC: 1.015 (ref 1.001–1.035)
Urobilinogen, POC: 0.2
pH, Urine, POC: 5.5 (ref 4.6–8.0)

## 2021-08-07 MED ORDER — METOPROLOL SUCCINATE ER 25 MG PO TB24
25 MG | ORAL_TABLET | Freq: Every day | ORAL | 1 refills | Status: AC
Start: 2021-08-07 — End: ?

## 2021-08-07 NOTE — Progress Notes (Signed)
HPI:  Leslie Lucas (DOB: 02-21-1961)    Wellness update    Problem List:  Patient Active Problem List   Diagnosis   ??? Postmenopausal   ??? Palpitations   ??? Pain of left upper extremity   ??? Atrial tachycardia (HCC)   ??? Hypercholesteremia       History:  Past Medical History:   Diagnosis Date   ??? [place]atston coronary artery calcium score less than 100 2022    score of 0   ??? Postmenopausal        Past Surgical History:   Procedure Laterality Date   ??? COLONOSCOPY  01/09/2020    normal with Dr. Maris Berger       Social History     Socioeconomic History   ??? Marital status: Married     Spouse name: Not on file   ??? Number of children: Not on file   ??? Years of education: Not on file   ??? Highest education level: Not on file   Occupational History   ??? Not on file   Tobacco Use   ??? Smoking status: Never   ??? Smokeless tobacco: Never   Substance and Sexual Activity   ??? Alcohol use: Never   ??? Drug use: Never   ??? Sexual activity: Not on file   Other Topics Concern   ??? Not on file   Social History Narrative   ??? Not on file     Social Determinants of Health     Financial Resource Strain: Not on file   Food Insecurity: Not on file   Transportation Needs: Not on file   Physical Activity: Not on file   Stress: Not on file   Social Connections: Not on file   Intimate Partner Violence: Not on file   Housing Stability: Not on file       Family History   Problem Relation Age of Onset   ??? Osteoporosis Mother    ??? Pancreatic Cancer Mother 26   ??? Dementia Mother    ??? Hypertension Mother    ??? Dementia Father 8   ??? No Known Problems Sister    ??? No Known Problems Daughter        Allergies:  No Known Allergies    Current Medications:  Current Outpatient Medications   Medication Sig Dispense Refill   ??? metoprolol succinate (TOPROL XL) 25 MG extended release tablet Take 1 tablet by mouth daily 15 tablet 1   ??? Multiple Vitamin (MULTIVITAMIN ADULT PO) Take by mouth daily     ??? Cyanocobalamin  (VITAMIN B 12 PO) Take by mouth       No current facility-administered medications for this visit.       Immunizations:  Immunization status:   Immunization History   Administered Date(s) Administered   ??? COVID-19, MODERNA BLUE border, Primary or Immunocompromised, (age 12y+), IM, 100 mcg/0.9mL 08/19/2019, 09/16/2019, 06/26/2020     .    Review of Systems:  Review of Systems   Constitutional:  Negative for unexpected weight change.   Respiratory:  Negative for shortness of breath.    Cardiovascular:  Negative for chest pain.   Genitourinary:  Positive for frequency.   Psychiatric/Behavioral:  Positive for sleep disturbance.    All other systems reviewed and are negative.    Vitals:  BP 110/80    Ht 5\' 3"  (1.6 m)    Wt 122 lb 6.4 oz (55.5 kg)    BMI 21.68 kg/m??  Physical Exam:  Physical Exam  Vitals reviewed.   Constitutional:       General: She is not in acute distress.     Appearance: Normal appearance. She is normal weight.   HENT:      Head: Normocephalic and atraumatic.      Right Ear: Tympanic membrane normal.      Left Ear: Tympanic membrane normal.   Eyes:      Extraocular Movements: Extraocular movements intact.      Pupils: Pupils are equal, round, and reactive to light.   Cardiovascular:      Rate and Rhythm: Normal rate and regular rhythm.      Pulses: Normal pulses.      Heart sounds: Normal heart sounds.   Pulmonary:      Effort: Pulmonary effort is normal.      Breath sounds: Normal breath sounds.   Abdominal:      General: Abdomen is flat. Bowel sounds are normal.      Palpations: Abdomen is soft.   Musculoskeletal:         General: Normal range of motion.      Cervical back: Normal range of motion and neck supple.   Skin:     General: Skin is warm and dry.   Neurological:      General: No focal deficit present.      Mental Status: She is alert and oriented to person, place, and time.   Psychiatric:         Mood and Affect: Mood normal.         Behavior: Behavior normal.         Thought Content:  Thought content normal.         Judgment: Judgment normal.        Assessment/Plan:   [name] was seen today for annual exam.    Diagnoses and all orders for this visit:    Wellness examination    Atrial tachycardia (HCC)    Urinary frequency  -     AMB POC URINALYSIS DIP STICK AUTO W/O MICRO    Hypercholesteremia    Other orders  -     metoprolol succinate (TOPROL XL) 25 MG extended release tablet; Take 1 tablet by mouth daily    Had labs in June    Need to work on cholesterol in diet    Regular exercise by walking    Keep Toprol on hand as needed for anxiety and heart racing with stressors  Discussed ways to reduce stress and sleep better esp with breathing techniques    No UTI noted     Need to update Mammo and BD          Rufina Falco, MD

## 2021-08-28 ENCOUNTER — Other Ambulatory Visit: Payer: Self-pay

## 2021-08-28 DIAGNOSIS — R682 Dry mouth, unspecified: Secondary | ICD-10-CM

## 2021-08-28 DIAGNOSIS — H04123 Dry eye syndrome of bilateral lacrimal glands: Secondary | ICD-10-CM

## 2021-08-28 DIAGNOSIS — M2550 Pain in unspecified joint: Secondary | ICD-10-CM

## 2021-09-10 NOTE — Progress Notes (Signed)
Pt called asking if she can get these labs done at [facility].  She said she was told she could by her eye doctor.  CB#  [phone]

## 2021-09-11 NOTE — Progress Notes (Signed)
Pt is going to come in tomorrow and have her labs drawn.

## 2021-09-11 NOTE — Progress Notes (Signed)
Lmtcb, unsure of what labs patient is referring to I would need clarification. Labs ordered in chart from 08/28/21 still show active. KW

## 2021-09-13 LAB — SJOGRENS SYNDROME-A EXTRACTABLE NUCLEAR ANTIBODY: ENA SSA (RO) Ab: 0.3 AI (ref 0.0–0.9)

## 2021-09-13 LAB — ANA W/REFLEX IF POSITIVE
Anti JO-1: <0.2 AI (ref 0.0–0.9)
Anti Nuclear Antibody (ANA): POSITIVE — AB
Centromere Ab Screen: <0.2 AI (ref 0.0–0.9)
Chromatin Ab SerPl-aCnc: <0.2 AI (ref 0.0–0.9)
ENA RNP Ab: 1.6 AI — ABNORMAL HIGH (ref 0.0–0.9)
ENA SM Ab Ser-aCnc: <0.2 AI (ref 0.0–0.9)
Scleroderma (Scl-70) (ENA) Antibody, IgG: <0.2 AI (ref 0.0–0.9)
dsDNA Ab: <1 IU/mL (ref 0–9)

## 2021-09-13 LAB — SJOGRENS SYNDROME-B EXTRACTABLE NUCLEAR ANTIBODY: ENA SSB (LA) Ab: <0.2 AI (ref 0.0–0.9)

## 2021-09-16 ENCOUNTER — Ambulatory Visit (INDEPENDENT_AMBULATORY_CARE_PROVIDER_SITE_OTHER): Payer: Managed Care, Other (non HMO) | Admitting: Family Medicine

## 2021-09-16 ENCOUNTER — Encounter: Payer: Self-pay | Admitting: Family Medicine

## 2021-09-16 ENCOUNTER — Other Ambulatory Visit: Payer: Self-pay

## 2021-09-16 VITALS — BP 151/91 | HR 122 | Temp 98.8°F | Wt 150.0 lb

## 2021-09-16 DIAGNOSIS — K219 Gastro-esophageal reflux disease without esophagitis: Secondary | ICD-10-CM

## 2021-09-16 DIAGNOSIS — M542 Cervicalgia: Secondary | ICD-10-CM | POA: Insufficient documentation

## 2021-09-16 DIAGNOSIS — H04121 Dry eye syndrome of right lacrimal gland: Secondary | ICD-10-CM | POA: Insufficient documentation

## 2021-09-16 DIAGNOSIS — M792 Neuralgia and neuritis, unspecified: Secondary | ICD-10-CM

## 2021-09-16 DIAGNOSIS — I10 Essential (primary) hypertension: Secondary | ICD-10-CM

## 2021-09-16 DIAGNOSIS — M359 Systemic involvement of connective tissue, unspecified: Secondary | ICD-10-CM | POA: Insufficient documentation

## 2021-09-16 DIAGNOSIS — H5789 Other specified disorders of eye and adnexa: Secondary | ICD-10-CM | POA: Insufficient documentation

## 2021-09-16 DIAGNOSIS — Z79899 Other long term (current) drug therapy: Secondary | ICD-10-CM | POA: Insufficient documentation

## 2021-09-16 DIAGNOSIS — F411 Generalized anxiety disorder: Secondary | ICD-10-CM | POA: Insufficient documentation

## 2021-09-16 DIAGNOSIS — K5904 Chronic idiopathic constipation: Secondary | ICD-10-CM

## 2021-09-16 MED ORDER — SERTRALINE HCL 50 MG PO TABS: 100.0000 mg | ORAL_TABLET | Freq: Every day | ORAL | Status: DC

## 2021-09-16 MED ORDER — PREDNISONE 5 MG PO TABS
5.0000 mg | ORAL_TABLET | Freq: Every day | ORAL | 0 refills | Status: DC
Start: 2021-09-16 — End: 2022-07-01

## 2021-09-16 MED ORDER — SUCRALFATE 1 G PO TABS: 1.0000 g | ORAL_TABLET | Freq: Two times a day (BID) | ORAL | 1 refills | Status: DC

## 2021-09-16 MED ORDER — GABAPENTIN 600 MG PO TABS: 600.0000 mg | ORAL_TABLET | Freq: Three times a day (TID) | ORAL | 1 refills | Status: DC

## 2021-09-16 NOTE — Assessment & Plan Note (Signed)
Encourage OTC genteal eye drops to assist given normal eye exams ?

## 2021-09-16 NOTE — Assessment & Plan Note (Signed)
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

## 2021-09-16 NOTE — Assessment & Plan Note (Signed)
Chronic, improving ?No change in vision ?Was seen at eye dr for eye exam ?

## 2021-09-16 NOTE — Assessment & Plan Note (Signed)
Chronic, stable ?Reports 2-3 BM/week ?Denies change in pattern, consistency, or appearance ?Encourage high fiber diet and increase in water ?Encourage activity to assist ?

## 2021-09-16 NOTE — Progress Notes (Signed)
151/91      Established patient visit   Patient: Jordan Baker   DOB: 1960-12-15   61 y.o. Female  MRN: [id number] Visit Date: 09/16/2021  Today's healthcare provider: Gwyneth Sprout, FNP  Pt-and husband Introduced to nurse practitioner role and practice setting.  All questions answered.  Discussed provider/patient relationship and expectations.   No chief complaint on file.  Subjective    HPI  Patient is a 61 year old female who presents for evaluation of headache with right eye involvement.  She has history of Virl Axe, diagnosed in 2015.  Until December 2022 she had only had a handful of episodes.  Since December she is now on her 3rd episode.  When this one started 6 days ago she was started on Valtrex by her eye doctor.  Then she was seen again 3 days ago she started having the drooping of her eye.  She was then again seen by her eye doctor and was given steroid eye drops.   Medications: Outpatient Medications Prior to Visit  Medication Sig   ALPRAZolam (XANAX) 0.5 MG tablet Take 1 tablet (0.5 mg total) by mouth daily as needed for anxiety.   amitriptyline (ELAVIL) 25 MG tablet Take 1 tablet (25 mg total) by mouth at bedtime.   amLODipine (NORVASC) 5 MG tablet Take 1 tablet (5 mg total) by mouth daily.   APPLE CIDER VINEGAR PO Take by mouth. gummies   cetirizine (ZYRTEC) 10 MG tablet Take 1 tablet (10 mg total) by mouth daily.   clobetasol (TEMOVATE) 0.05 % external solution Apply topically 2 (two) times daily.   cyclobenzaprine (FLEXERIL) 10 MG tablet Take 1 tablet (10 mg total) by mouth 3 (three) times daily as needed.   diclofenac Sodium (VOLTAREN) 1 % GEL Apply 4 g topically 4 (four) times daily.   fluticasone (FLONASE) 50 MCG/ACT nasal spray Use 2 spray(s) in each nostril once daily   Lactobacillus-Inulin (PROBIOTIC DIGESTIVE SUPPORT PO) Take by mouth. Raw probiotic Probiotic and preprobiotic   levothyroxine (SYNTHROID) 100 MCG tablet Take 1 tablet (100 mcg total) by  mouth daily.   MAGNESIUM PO Take 400 mg by mouth at bedtime.   metoCLOPramide (REGLAN) 5 MG tablet Take 1 tablet (5 mg total) by mouth 4 (four) times daily -  before meals and at bedtime.   Multiple Vitamins-Minerals (ZINC PO) Take 50 mg by mouth daily.   NON FORMULARY CPAP (Device) nightly   NON FORMULARY CBD oil   pravastatin (PRAVACHOL) 40 MG tablet Take 1 tablet (40 mg total) by mouth daily.   Psyllium (EQ DAILY FIBER) 400 MG CAPS Take by mouth.   VITAMIN D PO Take 1,000 Int'l Units/day by mouth.   VITAMIN E PO Take 180 mg by mouth daily.   [DISCONTINUED] sertraline (ZOLOFT) 100 MG tablet Take 1 tablet (100 mg total) by mouth daily.   [DISCONTINUED] sucralfate (CARAFATE) 1 GM/10ML suspension Take 10 mLs (1 g total) by mouth 4 (four) times daily -  with meals and at bedtime.   No facility-administered medications prior to visit.    Review of Systems  HENT:  Positive for ear pain.   Eyes:  Positive for redness. Negative for photophobia, pain, discharge, itching and visual disturbance.  Neurological:  Positive for dizziness, light-headedness and headaches.      Objective    BP (!) 151/91 (BP Location: Right Arm, Patient Position: Sitting, Cuff Size: Normal)    Pulse (!) 122    Temp 98.8 F (37.1 C) (Oral)  Wt 150 lb (68 kg)    LMP 06/20/2018 Comment: no urine pregnancy needed per Dr. Amie Critchley   SpO2 99%    BMI 27.44 kg/m    Physical Exam Vitals and nursing note reviewed.  Constitutional:      General: She is not in acute distress.    Appearance: Normal appearance. She is overweight. She is not ill-appearing, toxic-appearing or diaphoretic.  HENT:     Head: Normocephalic and atraumatic.     Left Ear: Tympanic membrane, ear canal and external ear normal. There is no impacted cerumen.  Cardiovascular:     Rate and Rhythm: Normal rate and regular rhythm.     Pulses: Normal pulses.     Heart sounds: Normal heart sounds. No murmur heard.   No friction rub. No gallop.   Pulmonary:     Effort: Pulmonary effort is normal. No respiratory distress.     Breath sounds: Normal breath sounds. No stridor. No wheezing, rhonchi or rales.  Chest:     Chest wall: No tenderness.  Abdominal:     General: Bowel sounds are normal.     Palpations: Abdomen is soft.  Musculoskeletal:        General: No swelling, deformity or signs of injury. Normal range of motion.     Cervical back: Tenderness present. No rigidity.     Right lower leg: No edema.     Left lower leg: No edema.  Lymphadenopathy:     Cervical: No cervical adenopathy.  Skin:    General: Skin is warm and dry.     Capillary Refill: Capillary refill takes less than 2 seconds.     Coloration: Skin is not jaundiced or pale.     Findings: No bruising, erythema, lesion or rash.  Neurological:     General: No focal deficit present.     Mental Status: She is alert and oriented to person, place, and time. Mental status is at baseline.     Cranial Nerves: No cranial nerve deficit.     Sensory: No sensory deficit.     Motor: No weakness.     Coordination: Coordination normal.  Psychiatric:        Mood and Affect: Mood normal.        Behavior: Behavior normal.        Thought Content: Thought content normal.        Judgment: Judgment normal.     No results found for any visits on 09/16/21.  Assessment & Plan     Problem List Items Addressed This Visit       Cardiovascular and Mediastinum   Primary hypertension    Chronic, previously stable; likely elevated d/t pain in R eye, mouth, R ear and neck Denies CP Denies SOB Denies DOE No LE Edema noted on exam Continue medication- norvasc 5 mg Refills stable Seek emergent care if you develop CP, chest pain or chest pressure         Digestive   Chronic idiopathic constipation    Chronic, stable Reports 2-3 BM/week Denies change in pattern, consistency, or appearance Encourage high fiber diet and increase in water Encourage activity to assist       Relevant Medications   sucralfate (CARAFATE) 1 g tablet   GERD (gastroesophageal reflux disease)    Chronic, stable Encourage diet modifications Stable on carafate Stable on reglan Continue probiotics Seek consult from GI again if needed      Relevant Medications   sucralfate (CARAFATE) 1 g tablet  Other   RESOLVED: Anxiety disorder   Relevant Medications   sertraline (ZOLOFT) 50 MG tablet   Burning sensation of eye    Encourage OTC genteal eye drops to assist given normal eye exams      Relevant Orders   Ambulatory referral to Immunology   Chronic prescription benzodiazepine use    Daily use; PDMP reviewed Will establish UDS Will establish controlled substance contract      Relevant Orders   Drug Screen 12+Alcohol+CRT, Ur   Connective tissue disease, 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    Referral placed Will start low dose prednisone to assist Pt reported taking a 'cbd thc gummy' to assist with pain to assist with sleep recently it's gotten so bad       Relevant Medications   predniSONE (DELTASONE) 5 MG tablet   Other Relevant Orders   Ambulatory referral to Immunology   Dry eye of right side    Chronic, improving No change in vision Was seen at eye dr for eye exam      Relevant Orders   Ambulatory referral to Immunology   GAD (generalized anxiety disorder)    Chronic, stable On SSRI- Zoloft and PRN, daily Xanax Encourage use of counseling, therapy Denies SI or HI; contracted to safety      Relevant Medications   sertraline (ZOLOFT) 50 MG tablet   Other Relevant Orders   Drug Screen 12+Alcohol+CRT, Ur   Neck pain    Continue heat/cold Continue muscle relaxants Work to stretch your back and neck muscles to promote relaxation      Relevant Medications   gabapentin (NEURONTIN) 600 MG tablet   Other Relevant Orders   Ambulatory referral to Immunology   Nerve pain    Chronic, worsening Was given prednisone eye gtts Has been using flexeril  around the clock Will start gabapentin and low dose steroid- refer to immunology for follow up on connective tissue marker that came back positive      Relevant Medications   gabapentin (NEURONTIN) 600 MG tablet     Return in about 4 weeks (around 10/14/2021) for HTN management.      Vonna Kotyk, FNP, have reviewed all documentation for this visit. The documentation on 09/16/21 for the exam, diagnosis, procedures, and orders are all accurate and complete.    Gwyneth Sprout, [place] [phone] (phone) [phone] (fax)  [place]

## 2021-09-16 NOTE — Assessment & Plan Note (Signed)
Chronic, stable ?Encourage diet modifications ?Stable on carafate ?Stable on reglan ?Continue probiotics ?Seek consult from GI again if needed ?

## 2021-09-16 NOTE — Assessment & Plan Note (Signed)
Continue heat/cold ?Continue muscle relaxants ?Work to [employer] back and neck muscles to promote relaxation ?

## 2021-09-16 NOTE — Assessment & Plan Note (Signed)
Chronic, previously stable; likely elevated d/t pain in R eye, mouth, R ear and neck ?Denies CP ?Denies SOB ?Denies DOE ?No LE Edema noted on exam ?Continue medication- norvasc 5 mg ?Refills stable ?Seek emergent care if you develop CP, chest pain or chest pressure ? ?

## 2021-09-16 NOTE — Assessment & Plan Note (Signed)
Chronic, stable ?On SSRI- Zoloft and PRN, daily Xanax ?Encourage use of counseling, therapy ?Denies SI or HI; contracted to safety ?

## 2021-09-16 NOTE — Assessment & Plan Note (Signed)
Referral placed ?Will start low dose prednisone to assist ?Pt reported taking a 'cbd thc gummy' to assist with pain to assist with sleep recently it's gotten so bad ? ?

## 2021-09-16 NOTE — Assessment & Plan Note (Signed)
Daily use; PDMP reviewed ?Will establish UDS ?Will establish controlled substance contract ?

## 2021-09-19 ENCOUNTER — Encounter: Payer: Self-pay | Admitting: Family Medicine

## 2021-09-21 LAB — DRUG SCREEN 12+ALCOHOL+CRT, UR
Amphetamines, Urine: NEGATIVE ng/mL
BENZODIAZ UR QL: NEGATIVE ng/mL
Barbiturate: NEGATIVE ng/mL
Cannabinoids: NEGATIVE
Cocaine (Metabolite): NEGATIVE ng/mL
Creatinine, Urine: 36.2 mg/dL (ref 20.0–300.0)
Ethanol, Urine: NEGATIVE %
Meperidine: NEGATIVE ng/mL
Methadone: NEGATIVE ng/mL
OPIATE SCREEN URINE: NEGATIVE ng/mL
Oxycodone/Oxymorphone, Urine: NEGATIVE ng/mL
Phencyclidine: NEGATIVE ng/mL
Propoxyphene: NEGATIVE ng/mL
Tramadol: NEGATIVE ng/mL

## 2021-09-23 ENCOUNTER — Encounter: Payer: Self-pay | Admitting: Family Medicine

## 2021-10-18 ENCOUNTER — Telehealth: Payer: Self-pay | Admitting: Family Medicine

## 2021-10-18 NOTE — Telephone Encounter (Signed)
Patient has been advised. KW 

## 2021-10-18 NOTE — Telephone Encounter (Signed)
Patient was advised by [facility] to bring the most recent lab results and office notes to appointment. Please fax to specialist and note when faxed.  ? ?Patient states if you would prefer her to pick up the lab results and office visit notes please call her and she will come to the office today. Patient appointment is scheduled with [facility] on  10/22/2021. Please call patient directly ?

## 2021-11-12 ENCOUNTER — Other Ambulatory Visit: Payer: Self-pay | Admitting: Family Medicine

## 2021-11-12 DIAGNOSIS — F411 Generalized anxiety disorder: Secondary | ICD-10-CM

## 2021-12-26 ENCOUNTER — Encounter: Payer: Self-pay | Admitting: Family Medicine

## 2021-12-26 ENCOUNTER — Other Ambulatory Visit: Payer: Self-pay | Admitting: Family Medicine

## 2021-12-26 MED ORDER — GABAPENTIN 300 MG PO CAPS: 300.0000 mg | ORAL_CAPSULE | Freq: Three times a day (TID) | ORAL | 1 refills | Status: DC

## 2021-12-31 ENCOUNTER — Ambulatory Visit (INDEPENDENT_AMBULATORY_CARE_PROVIDER_SITE_OTHER): Payer: Managed Care, Other (non HMO) | Admitting: Dermatology

## 2021-12-31 DIAGNOSIS — L309 Dermatitis, unspecified: Secondary | ICD-10-CM | POA: Diagnosis not present

## 2021-12-31 DIAGNOSIS — D225 Melanocytic nevi of trunk: Secondary | ICD-10-CM

## 2021-12-31 DIAGNOSIS — L409 Psoriasis, unspecified: Secondary | ICD-10-CM | POA: Diagnosis not present

## 2021-12-31 DIAGNOSIS — D239 Other benign neoplasm of skin, unspecified: Secondary | ICD-10-CM

## 2021-12-31 DIAGNOSIS — D485 Neoplasm of uncertain behavior of skin: Secondary | ICD-10-CM

## 2021-12-31 DIAGNOSIS — I781 Nevus, non-neoplastic: Secondary | ICD-10-CM

## 2021-12-31 DIAGNOSIS — Z1283 Encounter for screening for malignant neoplasm of skin: Secondary | ICD-10-CM | POA: Diagnosis not present

## 2021-12-31 DIAGNOSIS — L570 Actinic keratosis: Secondary | ICD-10-CM | POA: Diagnosis not present

## 2021-12-31 DIAGNOSIS — K146 Glossodynia: Secondary | ICD-10-CM

## 2021-12-31 DIAGNOSIS — L814 Other melanin hyperpigmentation: Secondary | ICD-10-CM

## 2021-12-31 DIAGNOSIS — Z85828 Personal history of other malignant neoplasm of skin: Secondary | ICD-10-CM

## 2021-12-31 DIAGNOSIS — D2339 Other benign neoplasm of skin of other parts of face: Secondary | ICD-10-CM

## 2021-12-31 DIAGNOSIS — L821 Other seborrheic keratosis: Secondary | ICD-10-CM

## 2021-12-31 DIAGNOSIS — L578 Other skin changes due to chronic exposure to nonionizing radiation: Secondary | ICD-10-CM

## 2021-12-31 DIAGNOSIS — D229 Melanocytic nevi, unspecified: Secondary | ICD-10-CM

## 2021-12-31 DIAGNOSIS — D1800 Hemangioma unspecified site: Secondary | ICD-10-CM

## 2021-12-31 MED ORDER — NYSTATIN 100000 UNIT/ML MT SUSP: OROMUCOSAL | 2 refills | Status: DC

## 2021-12-31 MED ORDER — FLUOCINOLONE ACETONIDE 0.01 % EX CREA: TOPICAL_CREAM | CUTANEOUS | 2 refills | Status: DC

## 2021-12-31 MED ORDER — CLOBETASOL PROPIONATE 0.05 % EX SOLN: CUTANEOUS | 2 refills | Status: DC

## 2021-12-31 NOTE — Patient Instructions (Addendum)
Wound Care Instructions  Cleanse wound gently with soap and water once a day then pat dry with clean gauze. Apply a thing coat of Petrolatum (petroleum jelly, "Vaseline") over the wound (unless you have an allergy to this). We recommend that you use a new, sterile tube of Vaseline. Do not pick or remove scabs. Do not remove the yellow or white "healing tissue" from the base of the wound.  Cover the wound with fresh, clean, nonstick gauze and secure with paper tape. You may use Band-Aids in place of gauze and tape if the would is small enough, but would recommend trimming much of the tape off as there is often too much. Sometimes Band-Aids can irritate the skin.  You should call the office for your biopsy report after 1 week if you have not already been contacted.  If you experience any problems, such as abnormal amounts of bleeding, swelling, significant bruising, significant pain, or evidence of infection, please call the office immediately.  FOR ADULT SURGERY PATIENTS: If you need something for pain relief you may take 1 extra strength Tylenol (acetaminophen) AND 2 Ibuprofen (200mg each) together every 4 hours as needed for pain. (do not take these if you are allergic to them or if you have a reason you should not take them.) Typically, you may only need pain medication for 1 to 3 days.    Due to recent changes in healthcare laws, you may see results of your pathology and/or laboratory studies on MyChart before the doctors have had a chance to review them. We understand that in some cases there may be results that are confusing or concerning to you. Please understand that not all results are received at the same time and often the doctors may need to interpret multiple results in order to provide you with the best plan of care or course of treatment. Therefore, we ask that you please give us 2 business days to thoroughly review all your results before contacting the office for clarification. Should we  see a critical lab result, you will be contacted sooner.   If You Need Anything After Your Visit  If you have any questions or concerns for your doctor, please call our main line at [phone] and press option 4 to reach your doctor's medical assistant. If no one answers, please leave a voicemail as directed and we will return your call as soon as possible. Messages left after 4 pm will be answered the following business day.   You may also send us a message via MyChart. We typically respond to MyChart messages within 1-2 business days.  For prescription refills, please ask your pharmacy to contact our office. Our fax number is [phone].  If you have an urgent issue when the clinic is closed that cannot wait until the next business day, you can page your doctor at the number below.    Please note that while we do our best to be available for urgent issues outside of office hours, we are not available 24/7.   If you have an urgent issue and are unable to reach us, you may choose to seek medical care at your doctor's office, retail clinic, urgent care center, or emergency room.  If you have a medical emergency, please immediately call 911 or go to the emergency department.  Pager Numbers  - Dr. Kowalski: [phone]  - Dr. Moye: [phone]  - Dr. Stewart: [phone]  In the event of inclement weather, please call our main line at [phone]   for an update on the status of any delays or closures.  Dermatology Medication Tips: Please keep the boxes that topical medications come in in order to help keep track of the instructions about where and how to use these. Pharmacies typically print the medication instructions only on the boxes and not directly on the medication tubes.   If your medication is too expensive, please contact our office at [phone] option 4 or send us a message through MyChart.   We are unable to tell what your co-pay for medications will be in advance  as this is different depending on your insurance coverage. However, we may be able to find a substitute medication at lower cost or fill out paperwork to get insurance to cover a needed medication.   If a prior authorization is required to get your medication covered by your insurance company, please allow us 1-2 business days to complete this process.  Drug prices often vary depending on where the prescription is filled and some pharmacies may offer cheaper prices.  The website www.goodrx.com contains coupons for medications through different pharmacies. The prices here do not account for what the cost may be with help from insurance (it may be cheaper with your insurance), but the website can give you the price if you did not use any insurance.  - You can print the associated coupon and take it with your prescription to the pharmacy.  - You may also stop by our office during regular business hours and pick up a GoodRx coupon card.  - If you need your prescription sent electronically to a different pharmacy, notify our office through [place] MyChart or by phone at [phone] option 4.     Si Usted Necesita Algo Despus de Su Visita  Tambin puede enviarnos un mensaje a travs de MyChart. Por lo general respondemos a los mensajes de MyChart en el transcurso de 1 a 2 das hbiles.  Para renovar recetas, por favor pida a su farmacia que se ponga en contacto con nuestra oficina. Nuestro nmero de fax es el [phone].  Si tiene un asunto urgente cuando la clnica est cerrada y que no puede esperar hasta el siguiente da hbil, puede llamar/localizar a su doctor(a) al nmero que aparece a continuacin.   Por favor, tenga en cuenta que aunque hacemos todo lo posible para estar disponibles para asuntos urgentes fuera del horario de oficina, no estamos disponibles las 24 horas del da, los 7 das de la semana.   Si tiene un problema urgente y no puede comunicarse con nosotros, puede optar  por buscar atencin mdica  en el consultorio de su doctor(a), en una clnica privada, en un centro de atencin urgente o en una sala de emergencias.  Si tiene una emergencia mdica, por favor llame inmediatamente al 911 o vaya a la sala de emergencias.  Nmeros de bper  - Dr. Kowalski: [phone]  - Dra. Moye: [phone]  - Dra. Stewart: [phone]  En caso de inclemencias del tiempo, por favor llame a nuestra lnea principal al [phone] para una actualizacin sobre el estado de cualquier retraso o cierre.  Consejos para la medicacin en dermatologa: Por favor, guarde las cajas en las que vienen los medicamentos de uso tpico para ayudarle a seguir las instrucciones sobre dnde y cmo usarlos. Las farmacias generalmente imprimen las instrucciones del medicamento slo en las cajas y no directamente en los tubos del medicamento.   Si su medicamento es muy caro, por favor, pngase en contacto con nuestra   oficina llamando al [phone] y presione la opcin 4 o envenos un mensaje a travs de MyChart.   No podemos decirle cul ser su copago por los medicamentos por adelantado ya que esto es diferente dependiendo de la cobertura de su seguro. Sin embargo, es posible que podamos encontrar un medicamento sustituto a menor costo o llenar un formulario para que el seguro cubra el medicamento que se considera necesario.   Si se requiere una autorizacin previa para que su compaa de seguros cubra su medicamento, por favor permtanos de 1 a 2 das hbiles para completar este proceso.  Los precios de los medicamentos varan con frecuencia dependiendo del lugar de dnde se surte la receta y alguna farmacias pueden ofrecer precios ms baratos.  El sitio web www.goodrx.com tiene cupones para medicamentos de diferentes farmacias. Los precios aqu no tienen en cuenta lo que podra costar con la ayuda del seguro (puede ser ms barato con su seguro), pero el sitio web puede darle el precio si  no utiliz ningn seguro.  - Puede imprimir el cupn correspondiente y llevarlo con su receta a la farmacia.  - Tambin puede pasar por nuestra oficina durante el horario de atencin regular y recoger una tarjeta de cupones de GoodRx.  - Si necesita que su receta se enve electrnicamente a una farmacia diferente, informe a nuestra oficina a travs de MyChart de [place] o por telfono llamando al [phone] y presione la opcin 4.  

## 2021-12-31 NOTE — Progress Notes (Unsigned)
Follow-Up Visit   Subjective  Jordan Baker is a 61 y.o. female who presents for the following: Annual Exam.  The patient presents for Total-Body Skin Exam (TBSE) for skin cancer screening and mole check.  The patient has spots, moles and lesions to be evaluated, some may be new or changing. She has a dark spot on her nose that she noticed come up in January 2023. She has psoriasis of the scalp and hairline and uses clobetasol solution to scalp and fluocinolone cream. Patient has bumps in the inside of her mouth, dryness, and burning lips she would like checked. History of BCC of the right nasal dorsum (2014).   The following portions of the chart were reviewed this encounter and updated as appropriate:       Review of Systems:  No other skin or systemic complaints except as noted in HPI or Assessment and Plan.  Objective  Well appearing patient in no apparent distress; mood and affect are within normal limits.  A full examination was performed including scalp, head, eyes, ears, nose, lips, neck, chest, axillae, abdomen, back, buttocks, bilateral upper extremities, bilateral lower extremities, hands, feet, fingers, toes, fingernails, and toenails. All findings within normal limits unless otherwise noted below.  Scalp Clear today.  central lower abdomen 8.0 x 6.0 mm light brown macule  Left buttock 4 x 3 mm medium brown macule  Right Hand Dorsum 5.0 x 3.0 mm speckled brown macule  Dorsum of Nose Brown macule with gritty scale  Left nasal dorsum Blanching red macule  Left Upper Forehead 2.5 mm gray blue macule  upper sternum Dark violaceous papule 2.0 mm  Oral mucosa, tongue, mucosal lips Oral mucosa, tongue, mucosal lips normal appearing    Assessment & Plan  Skin cancer screening performed today.  Actinic Damage - chronic, secondary to cumulative UV radiation exposure/sun exposure over time - diffuse scaly erythematous macules with underlying  dyspigmentation - Recommend daily broad spectrum sunscreen SPF 30+ to sun-exposed areas, reapply every 2 hours as needed.  - Recommend staying in the shade or wearing long sleeves, sun glasses (UVA+UVB protection) and wide brim hats (4-inch brim around the entire circumference of the hat). - Call for new or changing lesions.  History of Basal Cell Carcinoma of the Skin - No evidence of recurrence today of the right nasal dorsum (2014) - Recommend regular full body skin exams - Recommend daily broad spectrum sunscreen SPF 30+ to sun-exposed areas, reapply every 2 hours as needed.  - Call if any new or changing lesions are noted between office visits  Lentigines - Scattered tan macules, including nose - Due to sun exposure - Benign-appering, observe - Recommend daily broad spectrum sunscreen SPF 30+ to sun-exposed areas, reapply every 2 hours as needed. - Call for any changes  Seborrheic Keratoses - Stuck-on, waxy, tan-brown papules and/or plaques, including face  - Benign-appearing - Discussed benign etiology and prognosis. - Observe - Call for any changes  Hemangiomas - Red papules - Discussed benign nature - Observe - Call for any changes   Psoriasis Scalp  Chronic condition with duration or expected duration over one year. Currently well-controlled.   Psoriasis is a chronic non-curable, but treatable genetic/hereditary disease that may have other systemic features affecting other organ systems such as joints (Psoriatic Arthritis). It is associated with an increased risk of inflammatory bowel disease, heart disease, non-alcoholic fatty liver disease, and depression.    Continue Clobetasol Solution Apply to AA scalp QD/BID prn dsp 41m 2Rf  Continue fluocinolone 0.025% cream Apply to AA frontal hairline QD/BID prn dsp 30g 3Rf  Topical steroids (such as triamcinolone, fluocinolone, fluocinonide, mometasone, clobetasol, halobetasol, betamethasone, hydrocortisone) can cause  thinning and lightening of the skin if they are used for too long in the same area. Your physician has selected the right strength medicine for your problem and area affected on the body. Please use your medication only as directed by your physician to prevent side effects.    fluocinolone (VANOS) 0.01 % cream - Scalp Apply to affected areas face/frontal hairline once to twice daily as needed for psoriasis.  Dermatitis  Related Medications clobetasol (TEMOVATE) 0.05 % external solution Apply to affected areas scalp once to twice daily as needed. Avoid face.  Nevus (2) Left buttock; central lower abdomen  Benign-appearing.  Observation.  Call clinic for new or changing moles.  Recommend daily use of broad spectrum spf 30+ sunscreen to sun-exposed areas.   Lentigo Right Hand Dorsum  Stable. Benign, observe.    Pigmented actinic keratosis Dorsum of Nose  Actinic keratoses are precancerous spots that appear secondary to cumulative UV radiation exposure/sun exposure over time. They are chronic with expected duration over 1 year. A portion of actinic keratoses will progress to squamous cell carcinoma of the skin. It is not possible to reliably predict which spots will progress to skin cancer and so treatment is recommended to prevent development of skin cancer.  Recommend daily broad spectrum sunscreen SPF 30+ to sun-exposed areas, reapply every 2 hours as needed.  Recommend staying in the shade or wearing long sleeves, sun glasses (UVA+UVB protection) and wide brim hats (4-inch brim around the entire circumference of the hat). Call for new or changing lesions.  Destruction of lesion - Dorsum of Nose  Destruction method: cryotherapy   Informed consent: discussed and consent obtained   Lesion destroyed using liquid nitrogen: Yes   Region frozen until ice ball extended beyond lesion: Yes   Outcome: patient tolerated procedure well with no complications   Post-procedure details: wound  care instructions given   Additional details:  Prior to procedure, discussed risks of blister formation, small wound, skin dyspigmentation, or rare scar following cryotherapy. Recommend Vaseline ointment to treated areas while healing.   Telangiectasias Left nasal dorsum  Benign, observe.    Blue nevus Left Upper Forehead  Benign-appearing.  Observation.  Call clinic for new or changing moles.  Recommend daily use of broad spectrum spf 30+ sunscreen to sun-exposed areas.     Neoplasm of uncertain behavior of skin upper sternum  Epidermal / dermal shaving  Lesion diameter (cm):  0.3 Informed consent: discussed and consent obtained   Patient was prepped and draped in usual sterile fashion: Area prepped with alcohol. Anesthesia: the lesion was anesthetized in a standard fashion   Anesthetic:  1% lidocaine w/ epinephrine 1-100,000 buffered w/ 8.4% NaHCO3 Instrument used: flexible razor blade   Hemostasis achieved with: pressure, aluminum chloride and electrodesiccation   Outcome: patient tolerated procedure well   Post-procedure details: wound care instructions given   Post-procedure details comment:  Ointment and small bandage applied  Specimen 1 - Surgical pathology Differential Diagnosis: Irritated Hemangioma vs other Check Margins: No Dark violaceous papule 2.0 mm  Burning mouth syndrome Oral mucosa, tongue, mucosal lips  Chronic condition, Unclear etiology/perimenopausal, difficult to treat  Pt has had rheumatology work-up- possible Sjogrens, since she also has dry eyes, for dry mouth symptoms, rheum. discussed good dental hygiene, adequate water intake, and xylitol melts all of which she  is performing. She also uses a humidified CPAP machine.   Has tried OTC Biotene Currently on Gabapentin 300 mg PO tid  Start Magic Mouthwash (Nystatin, lidocaine, diphenhydramine) Swish and spit 77m QID prn mouth pain dsp 1829m2Rf.  Patient to call if not improving with mouthwash.  Observation.   magic mouthwash (nystatin, lidocaine, diphenhydrAMINE) suspension - Oral mucosa, tongue, mucosal lips Swish and spit 33m28mID prn mouth pain.   Return in about 1 year (around 01/01/2023) for TBSE, Hx BCC.  I, MJamesetta OrleansMA, am acting as scribe for TarBrendolyn PattyD . Documentation: I have reviewed the above documentation for accuracy and completeness, and I agree with the above.  TarBrendolyn Patty

## 2022-01-06 ENCOUNTER — Other Ambulatory Visit: Payer: Self-pay

## 2022-01-06 ENCOUNTER — Telehealth: Payer: Self-pay

## 2022-01-06 DIAGNOSIS — K146 Glossodynia: Secondary | ICD-10-CM

## 2022-01-06 MED ORDER — NYSTATIN 100000 UNIT/ML MT SUSP: OROMUCOSAL | 2 refills | Status: DC

## 2022-01-06 NOTE — Telephone Encounter (Signed)
Discussed biopsy results with pt  °

## 2022-03-21 ENCOUNTER — Other Ambulatory Visit: Payer: Self-pay | Admitting: Family Medicine

## 2022-03-21 DIAGNOSIS — F411 Generalized anxiety disorder: Secondary | ICD-10-CM

## 2022-04-21 IMAGING — US US ABDOMEN LIMITED
1 series · 14 of 25 positions shown · non-contrast
Comparison: CT 08/19/2018

CLINICAL DATA: Hepatic cysts identified on CT

EXAM:
ULTRASOUND ABDOMEN LIMITED RIGHT UPPER QUADRANT

[Series 1: us abdomen limited ruq (liver/gb) · 14 of 49 slices shown]
[im 1/49]
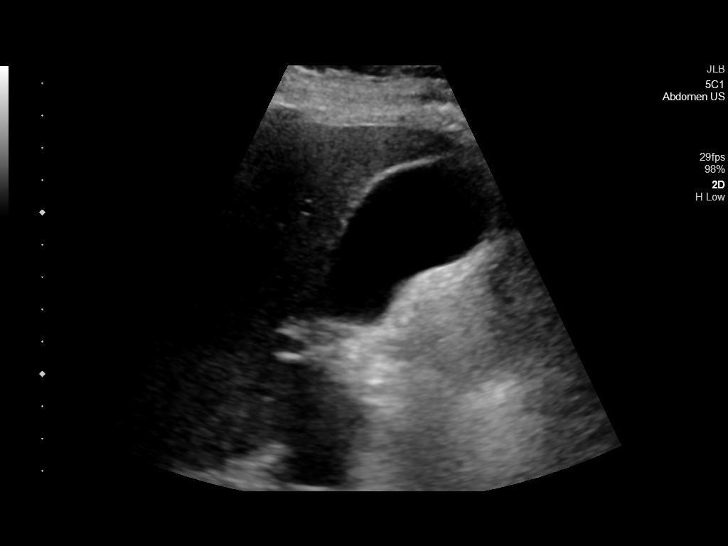
[im 5/49]
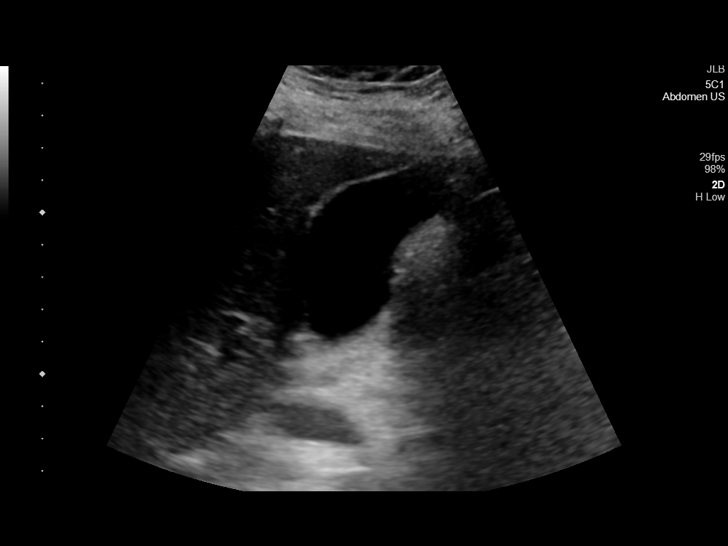
[im 9/49]
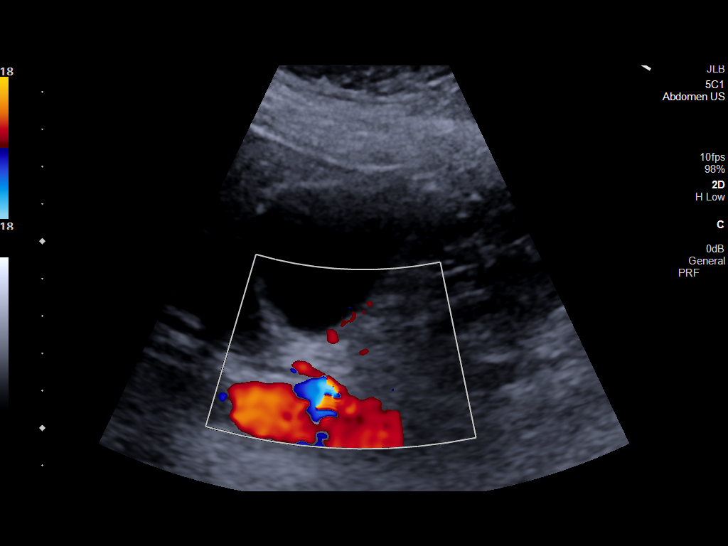
[im 13/49]
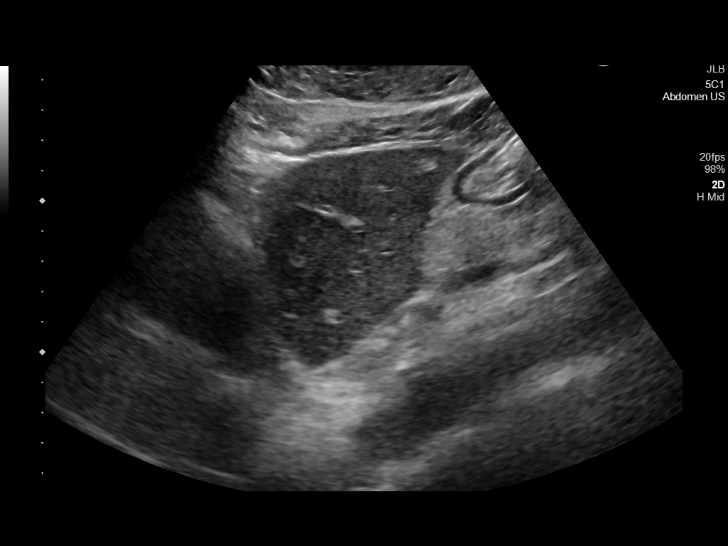
[im 17/49]
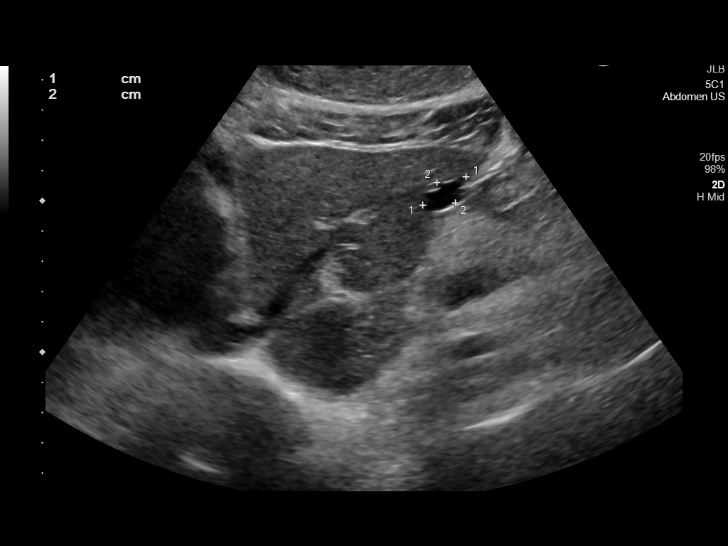
[im 19/49]
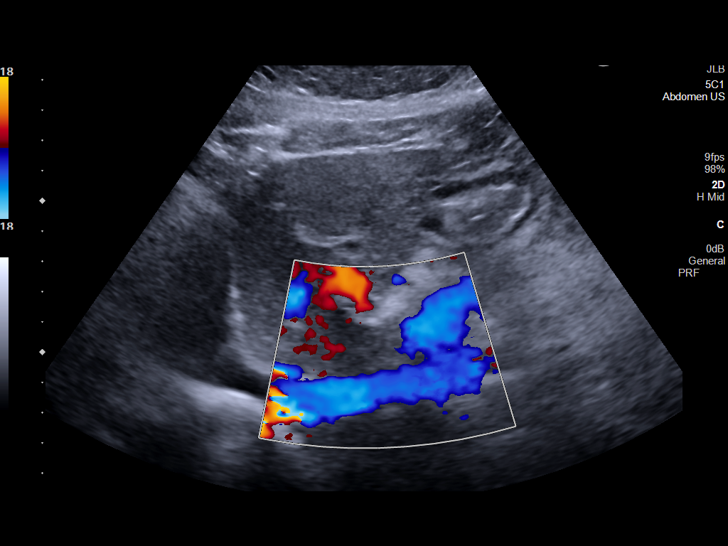
[im 23/49]
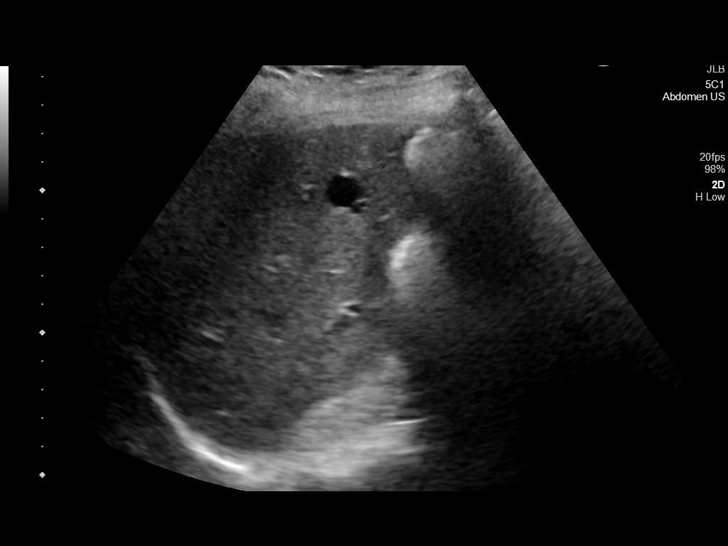
[im 27/49]
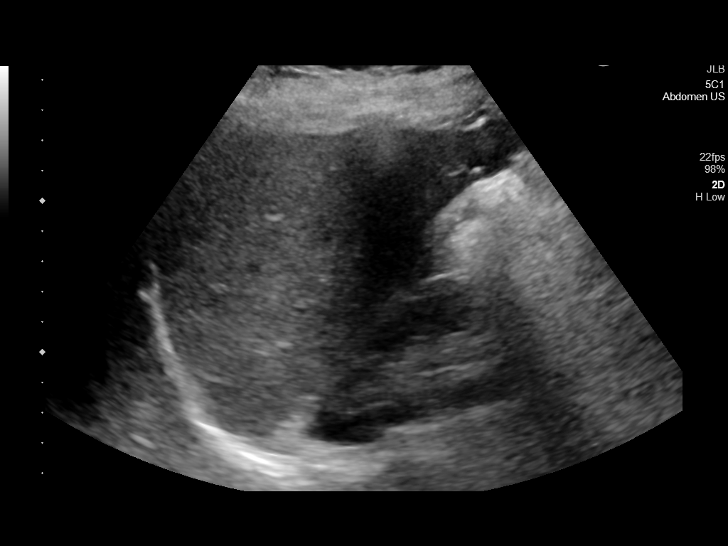
[im 31/49]
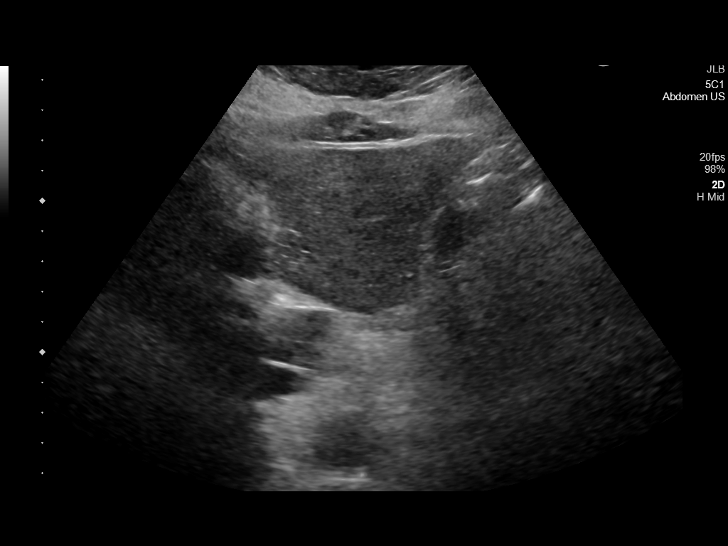
[im 33/49]
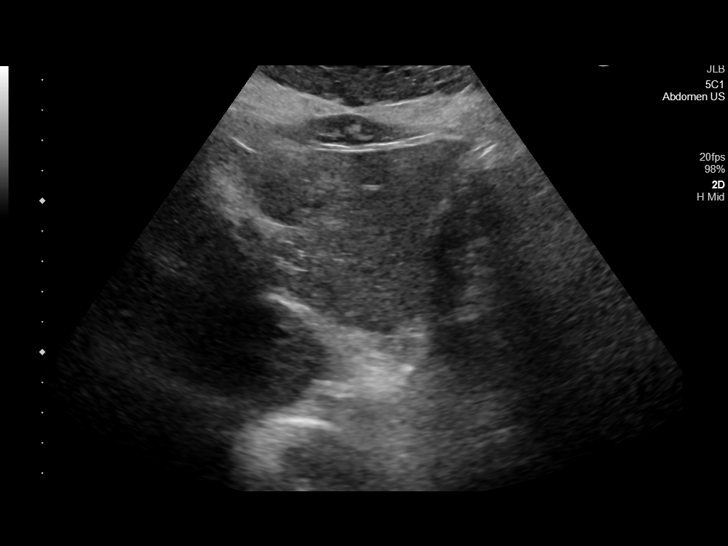
[im 37/49]
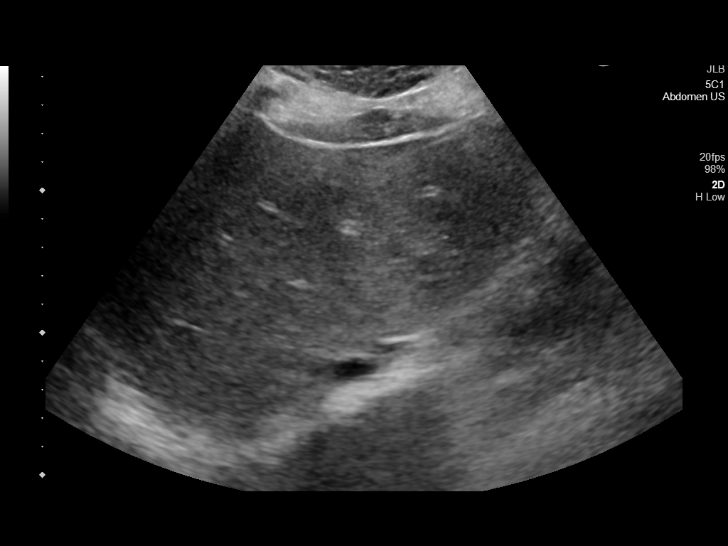
[im 41/49]
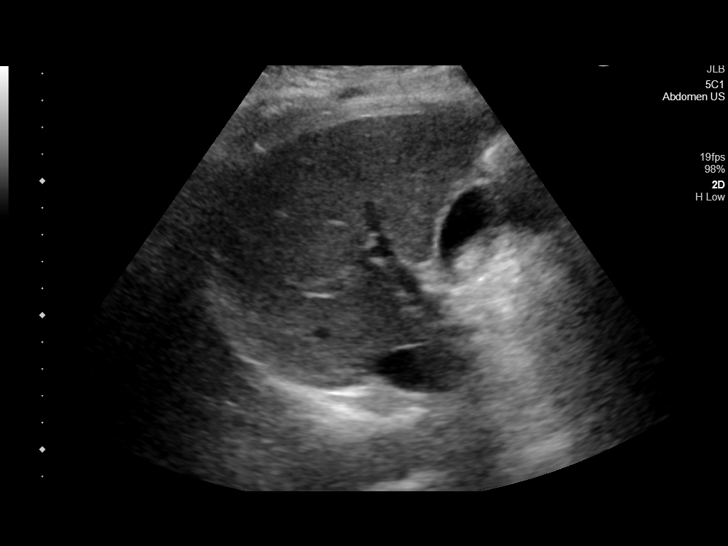
[im 45/49]
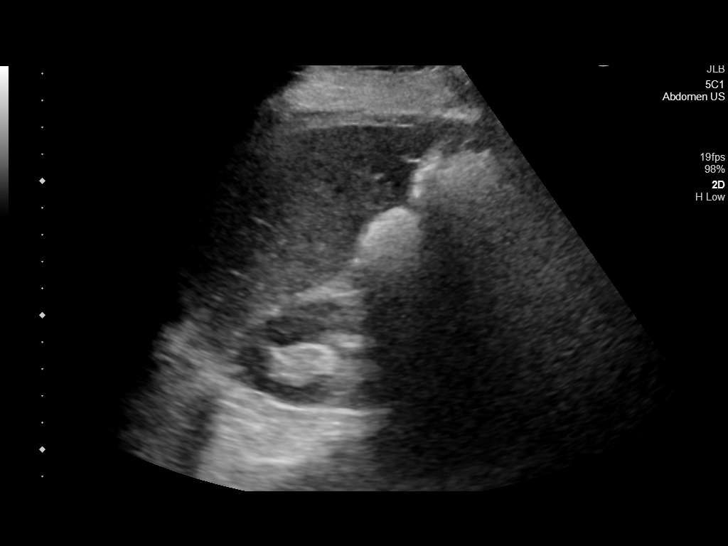
[im 49/49]
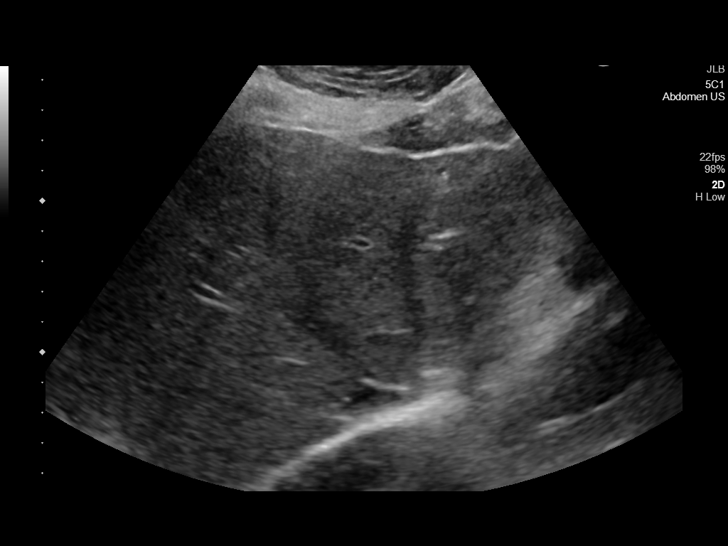

[14 of 25 positions shown; findings below may reference images not displayed]

FINDINGS: Gallbladder:

Gallbladder is partially collapsed. No gallstones evident. Negative
sonographic Murphy's sign.

Common bile duct:

Diameter: Normal at 2 mm

Liver:

There are 3 anechoic cysts within the liver ranging size from 1. To
.5 cm. No biliary duct dilatation. Portal vein is patent on color
Doppler imaging with normal direction of blood flow towards the
liver.

Other: Ascites
IMPRESSION: 1. Benign hepatic cysts unchanged from comparison CT.
2. Collapsed gallbladder.  No acute findings

## 2022-04-22 ENCOUNTER — Telehealth: Payer: Self-pay | Admitting: Family Medicine

## 2022-04-22 DIAGNOSIS — Z1239 Encounter for other screening for malignant neoplasm of breast: Secondary | ICD-10-CM

## 2022-04-22 NOTE — Telephone Encounter (Signed)
Referral Request - Has patient seen PCP for this complaint? No.  Referral for which specialty: Breast center  Preferred provider/office: [place] [facility]  Reason for referral: mammogram

## 2022-04-23 ENCOUNTER — Telehealth: Payer: Self-pay

## 2022-04-23 DIAGNOSIS — Z0000 Encounter for general adult medical examination without abnormal findings: Secondary | ICD-10-CM

## 2022-04-23 NOTE — Telephone Encounter (Signed)
Referral sent 

## 2022-04-23 NOTE — Telephone Encounter (Signed)
Patient wants labs before appt.

## 2022-04-24 NOTE — Telephone Encounter (Signed)
Spoke with patient and ordered labs.

## 2022-04-24 NOTE — Addendum Note (Signed)
Addended by: Smitty Knudsen on: 04/24/2022 11:28 AM   Modules accepted: Orders

## 2022-06-02 DIAGNOSIS — S52509A Unspecified fracture of the lower end of unspecified radius, initial encounter for closed fracture: Secondary | ICD-10-CM | POA: Insufficient documentation

## 2022-06-18 ENCOUNTER — Other Ambulatory Visit: Payer: Self-pay | Admitting: Family Medicine

## 2022-06-18 DIAGNOSIS — Z1231 Encounter for screening mammogram for malignant neoplasm of breast: Secondary | ICD-10-CM

## 2022-06-18 LAB — COMPREHENSIVE METABOLIC PANEL
ALT: 16 IU/L (ref 0–32)
AST: 15 IU/L (ref 0–40)
Albumin/Globulin Ratio: 2 (ref 1.2–2.2)
Albumin: 4.5 g/dL (ref 3.8–4.9)
Alkaline Phosphatase: 96 IU/L (ref 44–121)
BUN/Creatinine Ratio: 17 (ref 12–28)
BUN: 13 mg/dL (ref 8–27)
Bilirubin Total: 0.3 mg/dL (ref 0.0–1.2)
CO2: 21 mmol/L (ref 20–29)
Calcium: 9.4 mg/dL (ref 8.7–10.3)
Chloride: 101 mmol/L (ref 96–106)
Creatinine, Ser: 0.75 mg/dL (ref 0.57–1.00)
Globulin, Total: 2.2 g/dL (ref 1.5–4.5)
Glucose: 93 mg/dL (ref 70–99)
Potassium: 4 mmol/L (ref 3.5–5.2)
Sodium: 140 mmol/L (ref 134–144)
Total Protein: 6.7 g/dL (ref 6.0–8.5)
eGFR: 91 mL/min/{1.73_m2} (ref >59)

## 2022-06-18 LAB — LIPID PANEL
Chol/HDL Ratio: 3.6 ratio (ref 0.0–4.4)
Cholesterol, Total: 229 mg/dL — ABNORMAL HIGH (ref 100–199)
HDL: 64 mg/dL (ref >39)
LDL Chol Calc (NIH): 147 mg/dL — ABNORMAL HIGH (ref 0–99)
Triglycerides: 101 mg/dL (ref 0–149)
VLDL Cholesterol Cal: 18 mg/dL (ref 5–40)

## 2022-06-18 LAB — CBC
Hematocrit: 39 % (ref 34.0–46.6)
Hemoglobin: 13 g/dL (ref 11.1–15.9)
MCH: 30.2 pg (ref 26.6–33.0)
MCHC: 33.3 g/dL (ref 31.5–35.7)
MCV: 91 fL (ref 79–97)
Platelets: 288 10*3/uL (ref 150–450)
RBC: 4.31 x10E6/uL (ref 3.77–5.28)
RDW: 11.8 % (ref 11.7–15.4)
WBC: 5.9 10*3/uL (ref 3.4–10.8)

## 2022-06-18 LAB — TSH+T4F+T3FREE
Free T4: 1.44 ng/dL (ref 0.82–1.77)
T3, Free: 2.7 pg/mL (ref 2.0–4.4)
TSH: 1.5 u[IU]/mL (ref 0.450–4.500)

## 2022-06-18 LAB — HEMOGLOBIN A1C
Est. average glucose Bld gHb Est-mCnc: 111 mg/dL
Hgb A1c MFr Bld: 5.5 % (ref 4.8–5.6)

## 2022-06-18 LAB — VITAMIN B12: Vitamin B-12: 553 pg/mL (ref 232–1245)

## 2022-06-18 LAB — VITAMIN D 25 HYDROXY (VIT D DEFICIENCY, FRACTURES): Vit D, 25-Hydroxy: 56.8 ng/mL (ref 30.0–100.0)

## 2022-06-18 NOTE — Progress Notes (Signed)
Cholesterol has increased; both total and LDL remain above goal. However, calculated heart attack and/or stroke risk remains low at 4% in 10 years. I continue to recommend diet low in saturated fat and regular exercise - 30 min at least 5 times per week  The 10-year ASCVD risk score (Arnett DK, et al., 2019) is: 3.7%   Values used to calculate the score:     Age: 61 years     Sex: Female     Is Non-Hispanic African American: No     Diabetic: No     Tobacco smoker: No     Systolic Blood Pressure: 326 mmHg     Is BP treated: No     HDL Cholesterol: 64 mg/dL     Total Cholesterol: 229 mg/dL  All other labs are normal/stable.

## 2022-07-01 ENCOUNTER — Ambulatory Visit (INDEPENDENT_AMBULATORY_CARE_PROVIDER_SITE_OTHER): Payer: Managed Care, Other (non HMO) | Admitting: Family Medicine

## 2022-07-01 ENCOUNTER — Other Ambulatory Visit (HOSPITAL_COMMUNITY)
Admission: RE | Admit: 2022-07-01 | Discharge: 2022-07-01 | Disposition: A | Discharge status: Home / Self Care | Payer: Managed Care, Other (non HMO) | Source: Ambulatory Visit | Attending: Family Medicine | Admitting: Family Medicine

## 2022-07-01 ENCOUNTER — Encounter: Payer: Self-pay | Admitting: Family Medicine

## 2022-07-01 VITALS — BP 131/72 | HR 117 | Temp 98.7°F | Ht 62.0 in | Wt 156.0 lb

## 2022-07-01 DIAGNOSIS — Z124 Encounter for screening for malignant neoplasm of cervix: Secondary | ICD-10-CM | POA: Diagnosis not present

## 2022-07-01 DIAGNOSIS — Z79899 Other long term (current) drug therapy: Secondary | ICD-10-CM

## 2022-07-01 DIAGNOSIS — Z0000 Encounter for general adult medical examination without abnormal findings: Secondary | ICD-10-CM | POA: Diagnosis not present

## 2022-07-01 DIAGNOSIS — Z1231 Encounter for screening mammogram for malignant neoplasm of breast: Secondary | ICD-10-CM

## 2022-07-01 DIAGNOSIS — I10 Essential (primary) hypertension: Secondary | ICD-10-CM

## 2022-07-01 DIAGNOSIS — E7800 Pure hypercholesterolemia, unspecified: Secondary | ICD-10-CM

## 2022-07-01 DIAGNOSIS — E034 Atrophy of thyroid (acquired): Secondary | ICD-10-CM

## 2022-07-01 DIAGNOSIS — F411 Generalized anxiety disorder: Secondary | ICD-10-CM | POA: Diagnosis not present

## 2022-07-01 DIAGNOSIS — L409 Psoriasis, unspecified: Secondary | ICD-10-CM

## 2022-07-01 DIAGNOSIS — M62830 Muscle spasm of back: Secondary | ICD-10-CM

## 2022-07-01 DIAGNOSIS — J301 Allergic rhinitis due to pollen: Secondary | ICD-10-CM

## 2022-07-01 MED ORDER — GABAPENTIN 300 MG PO CAPS: 300.0000 mg | ORAL_CAPSULE | Freq: Every day | ORAL | 1 refills | Status: DC

## 2022-07-01 MED ORDER — LEVOTHYROXINE SODIUM 100 MCG PO TABS: 100.0000 ug | ORAL_TABLET | Freq: Every day | ORAL | 3 refills | Status: DC

## 2022-07-01 MED ORDER — PRAVASTATIN SODIUM 40 MG PO TABS: 40.0000 mg | ORAL_TABLET | Freq: Every day | ORAL | 1 refills | Status: DC

## 2022-07-01 MED ORDER — FLUOCINOLONE ACETONIDE 0.01 % EX CREA: TOPICAL_CREAM | CUTANEOUS | 2 refills | Status: DC

## 2022-07-01 MED ORDER — SERTRALINE HCL 100 MG PO TABS: 100.0000 mg | ORAL_TABLET | Freq: Every day | ORAL | 1 refills | Status: DC

## 2022-07-01 MED ORDER — CYCLOBENZAPRINE HCL 10 MG PO TABS: 10.0000 mg | ORAL_TABLET | Freq: Three times a day (TID) | ORAL | 3 refills | Status: DC | PRN

## 2022-07-01 MED ORDER — ALPRAZOLAM 0.5 MG PO TABS: 0.5000 mg | ORAL_TABLET | Freq: Two times a day (BID) | ORAL | 1 refills | Status: DC | PRN

## 2022-07-01 MED ORDER — GABAPENTIN 100 MG PO CAPS: 100.0000 mg | ORAL_CAPSULE | Freq: Two times a day (BID) | ORAL | 1 refills | Status: DC

## 2022-07-01 MED ORDER — FLUTICASONE PROPIONATE 50 MCG/ACT NA SUSP: NASAL | 3 refills | Status: DC

## 2022-07-01 NOTE — Assessment & Plan Note (Signed)
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

## 2022-07-01 NOTE — Progress Notes (Signed)
I,Connie R Striblin,acting as a Education administrator for Gwyneth Sprout, FNP.,have documented all relevant documentation on the behalf of Gwyneth Sprout, FNP,as directed by  Gwyneth Sprout, FNP while in the presence of Gwyneth Sprout, FNP.   Complete physical exam   Patient: Jordan Baker   DOB: [birth year]   61 y.o. Female  MRN: [id number] Visit Date: 07/01/2022  Today's healthcare provider: Gwyneth Sprout, FNP  Re Introduced to nurse practitioner role and practice setting.  All questions answered.  Discussed provider/patient relationship and expectations.   Chief Complaint  Patient presents with   Annual Exam   Subjective    Jordan Baker is a 61 y.o. female who presents today for a complete physical exam.  She reports consuming a general diet. The patient does not participate in regular exercise at present. She generally feels well. She reports sleeping fairly well. She does not have additional problems to discuss today.  HPI   Past Medical History:  Diagnosis Date   Actinic keratosis 10/26/2009   Right upper arm.   Anxiety    Basal cell carcinoma 07/12/2013   Right nasal dorsum. Nodular pattern.   Dysplastic nevus 11/05/2007   Left thigh. Moderate atypia. Close to surgical margin.   GERD (gastroesophageal reflux disease)    Hyperlipidemia    Hypothyroidism    Past Surgical History:  Procedure Laterality Date   BIOPSY RECTAL     CESAREAN SECTION  1990/1993   x2   COLONOSCOPY WITH PROPOFOL N/A 06/25/2018   Procedure: COLONOSCOPY WITH PROPOFOL;  Surgeon: [name], MD;  Location: [facility] ENDOSCOPY;  Service: Gastroenterology;  Laterality: N/A;   ESOPHAGOGASTRODUODENOSCOPY (EGD) WITH PROPOFOL N/A 06/25/2018   Procedure: ESOPHAGOGASTRODUODENOSCOPY (EGD) WITH PROPOFOL;  Surgeon: [name], MD;  Location: [facility] ENDOSCOPY;  Service: Gastroenterology;  Laterality: N/A;   EUS N/A 05/17/2015   Procedure: LOWER ENDOSCOPIC ULTRASOUND (EUS);  Surgeon: Holly Bodily, MD;  Location:  [facility] ENDOSCOPY;  Service: Gastroenterology;  Laterality: N/A;   JOINT REPLACEMENT Left 06/2017   OVARIAN CYST SURGERY  1993   partial knee replacemt Left 06/24/2017   Social History   Socioeconomic History   Marital status: Married    Spouse name: Not on file   Number of children: 2   Years of education: Not on file   Highest education level: Not on file  Occupational History    Employer: [employer]  Tobacco Use   Smoking status: Never   Smokeless tobacco: Never  Vaping Use   Vaping Use: Never used  Substance and Sexual Activity   Alcohol use: Yes    Alcohol/week: 0.0 - 1.0 standard drinks of alcohol    Comment: Occasional   Drug use: No   Sexual activity: Not on file  Other Topics Concern   Not on file  Social History Narrative   Not on file   Social Determinants of Health   Financial Resource Strain: Not on file  Food Insecurity: Not on file  Transportation Needs: Not on file  Physical Activity: Not on file  Stress: Not on file  Social Connections: Not on file  Intimate Partner Violence: Not on file   Family Status  Relation Name Status   PGM  Deceased at age 39       died from old age   Mother  76   Father  Deceased at age 13       Lung cancer   Sister 1 [place]  Deceased at age 22       died from old age   MGF  Deceased at age 32's       died from congestiv heart failure.   PGF  Deceased at age 20       died from old age   Sister 79 Alive   Son  Alive   Son  Alive   Family History  Problem Relation Age of Onset   Breast cancer Paternal Grandmother    Cancer Paternal Grandmother        Breast Cancer   Hyperlipidemia Mother    Hypertension Mother    Hypertension Sister    Arthritis Maternal Grandfather    Allergies  Allergen Reactions   Augmentin [Amoxicillin-Pot Clavulanate] Rash   Nitrofurantoin Monohyd Macro Rash    Patient Care Team: Gwyneth Sprout, FNP as PCP - General (Family Medicine)    Medications: Outpatient Medications Prior to Visit  Medication Sig   APPLE CIDER VINEGAR PO Take by mouth. gummies   cetirizine (ZYRTEC) 10 MG tablet Take 1 tablet (10 mg total) by mouth daily.   clobetasol (TEMOVATE) 0.05 % external solution Apply to affected areas scalp once to twice daily as needed. Avoid face.   diclofenac Sodium (VOLTAREN) 1 % GEL Apply 4 g topically 4 (four) times daily.   Lactobacillus-Inulin (PROBIOTIC DIGESTIVE SUPPORT PO) Take by mouth. Raw probiotic Probiotic and preprobiotic   magic mouthwash (nystatin, lidocaine, diphenhydrAMINE) suspension Swish and spit 57m QID prn mouth pain.   MAGNESIUM PO Take 400 mg by mouth at bedtime.   Multiple Vitamins-Minerals (ZINC PO) Take 50 mg by mouth daily.   NON FORMULARY CPAP (Device) nightly   NON FORMULARY CBD oil   Psyllium (EQ DAILY FIBER) 400 MG CAPS Take by mouth.   VITAMIN D PO Take 1,000 Int'l Units/day by mouth.   VITAMIN E PO Take 180 mg by mouth daily.   [DISCONTINUED] ALPRAZolam (XANAX) 0.5 MG tablet TAKE 1 TABLET BY MOUTH ONCE DAILY AS NEEDED FOR ANXIETY   [DISCONTINUED] cyclobenzaprine (FLEXERIL) 10 MG tablet Take 1 tablet (10 mg total) by mouth 3 (three) times daily as needed.   [DISCONTINUED] fluocinolone (VANOS) 0.01 % cream Apply to affected areas face/frontal hairline once to twice daily as needed for psoriasis.   [DISCONTINUED] fluticasone (FLONASE) 50 MCG/ACT nasal spray Use 2 spray(s) in each nostril once daily   [DISCONTINUED] gabapentin (NEURONTIN) 300 MG capsule Take 1 capsule (300 mg total) by mouth 3 (three) times daily.   [DISCONTINUED] levothyroxine (SYNTHROID) 100 MCG tablet Take 1 tablet (100 mcg total) by mouth daily.   [DISCONTINUED] pravastatin (PRAVACHOL) 40 MG tablet Take 1 tablet (40 mg total) by mouth daily.   [DISCONTINUED] sertraline (ZOLOFT) 50 MG tablet Take 2 tablets (100 mg total) by mouth daily.   [DISCONTINUED] sucralfate (CARAFATE) 1 g tablet Take 1 tablet (1 g total) by mouth  2 (two) times daily.   [DISCONTINUED] predniSONE (DELTASONE) 5 MG tablet Take 1 tablet (5 mg total) by mouth daily with breakfast.   No facility-administered medications prior to visit.    Review of Systems  Respiratory:  Positive for apnea.   Endocrine: Positive for polydipsia.    Objective    BP 131/72 (BP Location: Left Arm, Patient Position: Sitting, Cuff Size: Normal)   Pulse (!) 117   Temp 98.7 F (37.1 C) (Oral)   Ht '5\' 2"'$  (1.575 m)   Wt 156 lb (70.8 kg)   LMP 06/20/2018 Comment: no urine pregnancy needed  per Dr. Amie Critchley  SpO2 99%   BMI 28.53 kg/m   Physical Exam Vitals and nursing note reviewed.  Constitutional:      General: She is awake. She is not in acute distress.    Appearance: Normal appearance. She is well-developed, well-groomed and overweight. She is not ill-appearing, toxic-appearing or diaphoretic.  HENT:     Head: Normocephalic and atraumatic.     Jaw: There is normal jaw occlusion. No trismus, tenderness, swelling or pain on movement.     Right Ear: Hearing, tympanic membrane, ear canal and external ear normal. There is no impacted cerumen.     Left Ear: Hearing, tympanic membrane, ear canal and external ear normal. There is no impacted cerumen.     Nose: Nose normal. No congestion or rhinorrhea.     Right Turbinates: Not enlarged, swollen or pale.     Left Turbinates: Not enlarged, swollen or pale.     Right Sinus: No maxillary sinus tenderness or frontal sinus tenderness.     Left Sinus: No maxillary sinus tenderness or frontal sinus tenderness.     Mouth/Throat:     Lips: Pink.     Mouth: Mucous membranes are moist. No injury.     Tongue: No lesions.     Pharynx: Oropharynx is clear. Uvula midline. No pharyngeal swelling, oropharyngeal exudate, posterior oropharyngeal erythema or uvula swelling.     Tonsils: No tonsillar exudate or tonsillar abscesses.  Eyes:     General: Lids are normal. Lids are everted, no foreign bodies appreciated. Vision  grossly intact. Gaze aligned appropriately. No allergic shiner or visual field deficit.       Right eye: No discharge.        Left eye: No discharge.     Extraocular Movements: Extraocular movements intact.     Conjunctiva/sclera: Conjunctivae normal.     Right eye: Right conjunctiva is not injected. No exudate.    Left eye: Left conjunctiva is not injected. No exudate.    Pupils: Pupils are equal, round, and reactive to light.  Neck:     Thyroid: No thyroid mass, thyromegaly or thyroid tenderness.     Vascular: No carotid bruit.     Trachea: Trachea normal.  Cardiovascular:     Rate and Rhythm: Regular rhythm. Tachycardia present.     Pulses: Normal pulses.          Carotid pulses are 2+ on the right side and 2+ on the left side.      Radial pulses are 2+ on the right side and 2+ on the left side.       Dorsalis pedis pulses are 2+ on the right side and 2+ on the left side.       Posterior tibial pulses are 2+ on the right side and 2+ on the left side.     Heart sounds: Normal heart sounds, S1 normal and S2 normal. No murmur heard.    No friction rub. No gallop.  Pulmonary:     Effort: Pulmonary effort is normal. No respiratory distress.     Breath sounds: Normal breath sounds and air entry. No stridor. No wheezing, rhonchi or rales.  Chest:     Chest wall: No tenderness.     Comments: Breasts: breasts appear normal, no suspicious masses, no skin or nipple changes or axillary nodes, symmetric fibrous changes in both upper outer quadrants, right breast normal without mass, skin or nipple changes or axillary nodes, left breast normal without mass, skin or nipple  changes or axillary nodes, risk and benefit of breast self-exam was discussed  Abdominal:     General: Abdomen is flat. Bowel sounds are normal. There is no distension.     Palpations: Abdomen is soft. There is no mass.     Tenderness: There is no abdominal tenderness. There is no right CVA tenderness, left CVA tenderness,  guarding or rebound.     Hernia: No hernia is present.  Genitourinary:    General: Normal vulva.     Exam position: Lithotomy position.     Pubic Area: No rash or pubic lice.      Tanner stage (genital): 5.     Labia:        Right: No rash, tenderness, lesion or injury.        Left: No rash, tenderness, lesion or injury.      Vagina: Normal.     Cervix: Normal.     Uterus: Normal.      Adnexa: Right adnexa normal and left adnexa normal.  Musculoskeletal:        General: No swelling, tenderness, deformity or signs of injury. Normal range of motion.     Cervical back: Full passive range of motion without pain, normal range of motion and neck supple. No edema, rigidity or tenderness. No muscular tenderness.     Right lower leg: No edema.     Left lower leg: No edema.  Lymphadenopathy:     Cervical: No cervical adenopathy.     Right cervical: No superficial, deep or posterior cervical adenopathy.    Left cervical: No superficial, deep or posterior cervical adenopathy.  Skin:    General: Skin is warm and dry.     Capillary Refill: Capillary refill takes less than 2 seconds.     Coloration: Skin is not jaundiced or pale.     Findings: No bruising, erythema, lesion or rash.  Neurological:     General: No focal deficit present.     Mental Status: She is alert and oriented to person, place, and time. Mental status is at baseline.     GCS: GCS eye subscore is 4. GCS verbal subscore is 5. GCS motor subscore is 6.     Sensory: Sensation is intact. No sensory deficit.     Motor: Motor function is intact. No weakness.     Coordination: Coordination is intact. Coordination normal.     Gait: Gait is intact. Gait normal.  Psychiatric:        Attention and Perception: Attention and perception normal.        Mood and Affect: Mood and affect normal.        Speech: Speech normal.        Behavior: Behavior normal. Behavior is cooperative.        Thought Content: Thought content normal.         Cognition and Memory: Cognition and memory normal.        Judgment: Judgment normal.     Last depression screening scores    07/01/2022    3:16 PM 09/16/2021    3:59 PM 03/20/2021    4:13 PM  PHQ 2/9 Scores  PHQ - 2 Score '1 1 3  '$ PHQ- 9 Score '4 6 8   '$ Last fall risk screening    07/01/2022    3:16 PM  [place] in the past year? 1  Number falls in past yr: 0  Injury with Fall? 1  Comment broken wrist  Last Audit-C alcohol use screening    07/01/2022    3:16 PM  Alcohol Use Disorder Test (AUDIT)  1. How often do you have a drink containing alcohol? 2  2. How many drinks containing alcohol do you have on a typical day when you are drinking? 0  3. How often do you have six or more drinks on one occasion? 0  AUDIT-C Score 2   A score of 3 or more in women, and 4 or more in men indicates increased risk for alcohol abuse, EXCEPT if all of the points are from question 1   No results found for any visits on 07/01/22.  Assessment & Plan    Routine Health Maintenance and Physical Exam  Exercise Activities and Dietary recommendations  Goals   None     Immunization History  Administered Date(s) Administered   Influenza, High Dose Seasonal PF 07/26/2020   Influenza-Unspecified 03/22/2018   Janssen (J&J) SARS-COV-2 Vaccination 07/26/2020   Td 06/02/2016   Tdap 10/21/2005   Zoster Recombinat (Shingrix) 06/28/2021    Health Maintenance  Topic Date Due   Zoster Vaccines- Shingrix (2 of 2) 08/23/2021   INFLUENZA VACCINE  02/11/2022   PAP SMEAR-Modifier  06/22/2022   COVID-19 Vaccine (2 - Janssen risk series) 07/17/2022 (Originally 08/23/2020)   COLONOSCOPY (Pts 45-34yr Insurance coverage will need to be confirmed)  06/26/2023   MAMMOGRAM  07/25/2023   DTaP/Tdap/Td (3 - Td or Tdap) 06/02/2026   Hepatitis C Screening  Completed   HIV Screening  Completed   HPV VACCINES  Aged Out    Discussed health benefits of physical activity, and encouraged her to engage in  regular exercise appropriate for her age and condition.  Problem List Items Addressed This Visit       Cardiovascular and Mediastinum   Primary hypertension    Chronic, stable Continue diet/exercise to assist Not on medications at this time Recommend medications if >140/>90      Relevant Medications   pravastatin (PRAVACHOL) 40 MG tablet     Respiratory   Chronic seasonal allergic rhinitis due to pollen    Chronic, stable Request for refills      Relevant Medications   fluticasone (FLONASE) 50 MCG/ACT nasal spray     Endocrine   Hypothyroidism    Chronic, stable Continue 100 mcg levothyroxine  Labs stable earlier this month       Relevant Medications   levothyroxine (SYNTHROID) 100 MCG tablet     Musculoskeletal and Integument   Psoriasis    Chronic, stable Followed by derm Request for refills       Relevant Medications   fluocinolone (VANOS) 0.01 % cream     Other   Annual physical exam - Primary    UTD on dental and vision Mammo scheduled PAP completed today Colon not due Things to do to keep yourself healthy  - Exercise at least 30-45 minutes a day, 3-4 days a week.  - Eat a low-fat diet with lots of fruits and vegetables, up to 7-9 servings per day.  - Seatbelts can save your life. Wear them always.  - Smoke detectors on every level of your home, check batteries every year.  - Eye Doctor - have an eye exam every 1-2 years  - Safe sex - if you may be exposed to STDs, use a condom.  - Alcohol -  If you drink, do it moderately, less than 2 drinks per day.  - [place] Choose someone  to speak for you if you are not able.  - Depression is common in our stressful world.If you're feeling down or losing interest in things you normally enjoy, please come in for a visit.  - Violence - If anyone is threatening or hurting you, please call immediately.       Relevant Orders   MM 3D SCREEN BREAST BILATERAL   Cytology - PAP   Chronic  prescription benzodiazepine use    Patient is aware of risks of abortive anxiety medication use to include increased sedation, respiratory suppression, falls, dependence and cardiovascular events.  Patient would like to continue treatment as benefit determined to outweigh risk.         Encounter for Papanicolaou smear for cervical cancer screening    Repeated 3 year interval given no cell sampling of transformation zone; previously HPV negative       Relevant Orders   Cytology - PAP   RESOLVED: GAD (generalized anxiety disorder)   Relevant Medications   ALPRAZolam (XANAX) 0.5 MG tablet   sertraline (ZOLOFT) 100 MG tablet   Generalized anxiety disorder    Chronic, worsened since losing two friends and co-workers s/s cancer in last month Wishes to increase xanax to assist with grief Contracted for safety      Relevant Medications   ALPRAZolam (XANAX) 0.5 MG tablet   sertraline (ZOLOFT) 100 MG tablet   Hypercholesterolemia    Chronic, stable but elevated Declines increase of pravachol >40 mg given previous complications Plans to increase activity in 2024 and work on heart healthy diet LDL goal of <100      Relevant Medications   pravastatin (PRAVACHOL) 40 MG tablet   Muscle spasm of back    Acute on chronic, stable Request for refills       Relevant Medications   cyclobenzaprine (FLEXERIL) 10 MG tablet   Screening mammogram for breast cancer    Due for screening for mammogram, denies breast concerns, provided with phone number to call and schedule appointment for mammogram. Encouraged to repeat breast cancer screening every 1-2 years.       Relevant Orders   MM 3D SCREEN BREAST BILATERAL   Return in about 6 months (around 12/31/2022) for chonic disease management.    Vonna Kotyk, FNP, have reviewed all documentation for this visit. The documentation on 07/01/22 for the exam, diagnosis, procedures, and orders are all accurate and complete.  Gwyneth Sprout, [place] [phone] (phone) [phone] (fax)  [place]

## 2022-07-01 NOTE — Assessment & Plan Note (Signed)
Patient is aware of risks of abortive anxiety medication use to include increased sedation, respiratory suppression, falls, dependence and cardiovascular events.  Patient would like to continue treatment as benefit determined to outweigh risk.

## 2022-07-01 NOTE — Assessment & Plan Note (Signed)
Chronic, stable Continue 100 mcg levothyroxine  Labs stable earlier this month

## 2022-07-01 NOTE — Assessment & Plan Note (Signed)
Chronic, stable Request for refills

## 2022-07-01 NOTE — Assessment & Plan Note (Signed)
Chronic, stable but elevated Declines increase of pravachol >40 mg given previous complications Plans to increase activity in 2024 and work on heart healthy diet LDL goal of <100

## 2022-07-01 NOTE — Assessment & Plan Note (Signed)
Chronic, stable Followed by derm Request for refills

## 2022-07-01 NOTE — Assessment & Plan Note (Signed)
Acute on chronic, stable Request for refills

## 2022-07-01 NOTE — Assessment & Plan Note (Signed)
Chronic, worsened since losing two friends and co-workers s/s cancer in last month Wishes to increase xanax to assist with grief Contracted for safety

## 2022-07-01 NOTE — Assessment & Plan Note (Signed)
Chronic, stable Continue diet/exercise to assist Not on medications at this time Recommend medications if >140/>90

## 2022-07-01 NOTE — Assessment & Plan Note (Signed)
Due for screening for mammogram, denies breast concerns, provided with phone number to call and schedule appointment for mammogram. Encouraged to repeat breast cancer screening every 1-2 years.  

## 2022-07-01 NOTE — Assessment & Plan Note (Signed)
UTD on dental and vision Mammo scheduled PAP completed today Colon not due Things to do to keep yourself healthy  - Exercise at least 30-45 minutes a day, 3-4 days a week.  - Eat a low-fat diet with lots of fruits and vegetables, up to 7-9 servings per day.  - Seatbelts can save your life. Wear them always.  - Smoke detectors on every level of your home, check batteries every year.  - Eye Doctor - have an eye exam every 1-2 years  - Safe sex - if you may be exposed to STDs, use a condom.  - Alcohol -  If you drink, do it moderately, less than 2 drinks per day.  - [place]. Choose someone to speak for you if you are not able.  - Depression is common in our stressful world.If you're feeling down or losing interest in things you normally enjoy, please come in for a visit.  - Violence - If anyone is threatening or hurting you, please call immediately.

## 2022-07-03 ENCOUNTER — Ambulatory Visit: Payer: Managed Care, Other (non HMO) | Admitting: Family Medicine

## 2022-07-08 LAB — CYTOLOGY - PAP
Chlamydia: NEGATIVE
Comment: NEGATIVE
Comment: NEGATIVE
Comment: NEGATIVE
Comment: NEGATIVE
Comment: NORMAL
Diagnosis: NEGATIVE
HSV1: NEGATIVE
HSV2: NEGATIVE
High risk HPV: NEGATIVE
Neisseria Gonorrhea: NEGATIVE
Trichomonas: NEGATIVE

## 2022-07-08 NOTE — Progress Notes (Signed)
Hi Harshika,  Normal PAP smear; repeat in 5 years.  Please let us know if you have any questions.  Thank you,  Tally Joe, FNP

## 2022-07-28 ENCOUNTER — Ambulatory Visit
Admission: RE | Admit: 2022-07-28 | Discharge: 2022-07-28 | Disposition: A | Discharge status: Home / Self Care | Payer: Managed Care, Other (non HMO) | Source: Ambulatory Visit | Attending: Family Medicine | Admitting: Family Medicine

## 2022-07-28 DIAGNOSIS — Z1231 Encounter for screening mammogram for malignant neoplasm of breast: Secondary | ICD-10-CM | POA: Insufficient documentation

## 2022-07-29 NOTE — Progress Notes (Signed)
Hi Jordan Baker  Normal mammogram; repeat in 1 year.  Please let us know if you have any questions.  Thank you,  Tally Joe, FNP

## 2022-08-06 ENCOUNTER — Ambulatory Visit (INDEPENDENT_AMBULATORY_CARE_PROVIDER_SITE_OTHER): Payer: Managed Care, Other (non HMO)

## 2022-08-06 ENCOUNTER — Ambulatory Visit: Payer: Managed Care, Other (non HMO)

## 2022-08-06 DIAGNOSIS — Z23 Encounter for immunization: Secondary | ICD-10-CM

## 2022-08-11 ENCOUNTER — Encounter

## 2022-08-12 ENCOUNTER — Encounter

## 2022-08-12 LAB — CBC WITH AUTO DIFFERENTIAL
Absolute Immature Granulocyte: 0 10*3/uL (ref 0.0–0.5)
Basophils %: 1 % (ref 0.0–2.0)
Basophils Absolute: 0 10*3/uL (ref 0.0–0.2)
Eosinophils %: 3 % (ref 0.5–7.8)
Eosinophils Absolute: 0.1 10*3/uL (ref 0.0–0.8)
Hematocrit: 38.4 % (ref 35.8–46.3)
Hemoglobin: 12.5 g/dL (ref 11.7–15.4)
Immature Granulocytes: 0 % (ref 0.0–5.0)
Lymphocytes %: 62 % — ABNORMAL HIGH (ref 13–44)
Lymphocytes Absolute: 1.7 10*3/uL (ref 0.5–4.6)
MCH: 32.4 PG (ref 26.1–32.9)
MCHC: 32.6 g/dL (ref 31.4–35.0)
MCV: 99.5 FL (ref 82–102)
MPV: 11.9 FL (ref 9.4–12.3)
Monocytes %: 8 % (ref 4.0–12.0)
Monocytes Absolute: 0.2 10*3/uL (ref 0.1–1.3)
Neutrophils %: 25 % — ABNORMAL LOW (ref 43–78)
Neutrophils Absolute: 0.7 10*3/uL — ABNORMAL LOW (ref 1.7–8.2)
Platelets: 198 10*3/uL (ref 150–450)
RBC: 3.86 M/uL — ABNORMAL LOW (ref 4.05–5.2)
RDW: 11.9 % (ref 11.9–14.6)
WBC: 2.7 10*3/uL — ABNORMAL LOW (ref 4.3–11.1)
nRBC: 0 10*3/uL (ref 0.0–0.2)

## 2022-08-13 ENCOUNTER — Ambulatory Visit
Admit: 2022-08-13 | Discharge: 2022-08-13 | Payer: BLUE CROSS/BLUE SHIELD | Attending: Internal Medicine | Primary: Internal Medicine

## 2022-08-13 DIAGNOSIS — Z0000 Encounter for general adult medical examination without abnormal findings: Secondary | ICD-10-CM

## 2022-08-13 LAB — COMPREHENSIVE METABOLIC PANEL
ALT: 15 U/L (ref 12–65)
AST: 16 U/L (ref 15–37)
Albumin/Globulin Ratio: 1.3 (ref 0.4–1.6)
Albumin: 3.8 g/dL (ref 3.2–4.6)
Alk Phosphatase: 66 U/L (ref 50–136)
Anion Gap: 1 mmol/L — ABNORMAL LOW (ref 2–11)
BUN: 14 MG/DL (ref 8–23)
CO2: 30 mmol/L (ref 21–32)
Calcium: 8.7 MG/DL (ref 8.3–10.4)
Chloride: 112 mmol/L (ref 103–113)
Creatinine: 0.8 MG/DL (ref 0.6–1.0)
Est, Glom Filt Rate: >60 mL/min/{1.73_m2} (ref >60)
Globulin: 3 g/dL (ref 2.8–4.5)
Glucose: 92 mg/dL (ref 65–100)
Potassium: 4.3 mmol/L (ref 3.5–5.1)
Sodium: 143 mmol/L (ref 136–146)
Total Bilirubin: 0.4 MG/DL (ref 0.2–1.1)
Total Protein: 6.8 g/dL (ref 6.3–8.2)

## 2022-08-13 LAB — LIPID PANEL
Chol/HDL Ratio: 3.1
Cholesterol, Total: 190 MG/DL (ref <200)
HDL: 62 MG/DL — ABNORMAL HIGH (ref 40–60)
LDL Calculated: 112.4 MG/DL — ABNORMAL HIGH (ref <100)
Triglycerides: 78 MG/DL (ref 35–150)
VLDL Cholesterol Calculated: 15.6 MG/DL (ref 6.0–23.0)

## 2022-08-13 LAB — TSH: TSH, 3RD GENERATION: 2.51 u[IU]/mL (ref 0.358–3.740)

## 2022-08-13 NOTE — Progress Notes (Signed)
HPI:  Leslie Lucas (DOB: Jan 11, 1961)    Wellness update      Had labs and review results today      Problem List:  Patient Active Problem List   Diagnosis   . Postmenopausal   . Palpitations   . Pain of left upper extremity   . Atrial tachycardia   . Hypercholesteremia       History:  Past Medical History:   Diagnosis Date   . Agatston coronary artery calcium score less than 100 2022    score of 0   . Postmenopausal        Past Surgical History:   Procedure Laterality Date   . COLONOSCOPY  01/09/2020    normal with Dr. Bennetta Lucas       Social History     Socioeconomic History   . Marital status: Married     Spouse name: Not on file   . Number of children: Not on file   . Years of education: Not on file   . Highest education level: Not on file   Occupational History   . Not on file   Tobacco Use   . Smoking status: Never   . Smokeless tobacco: Never   Substance and Sexual Activity   . Alcohol use: Never   . Drug use: Never   . Sexual activity: Not on file   Other Topics Concern   . Not on file   Social History Narrative   . Not on file     Social Determinants of Health     Financial Resource Strain: Low Risk  (08/13/2022)    Overall Financial Resource Strain (CARDIA)    . Difficulty of Paying Living Expenses: Not hard at all   Food Insecurity: No Food Insecurity (08/13/2022)    Hunger Vital Sign    . Worried About Charity fundraiser in the Last Year: Never true    . Ran Out of Food in the Last Year: Never true   Transportation Needs: Unknown (08/13/2022)    PRAPARE - Transportation    . Lack of Transportation (Medical): Not on file    . Lack of Transportation (Non-Medical): No   Physical Activity: Not on file   Stress: Not on file   Social Connections: Not on file   Intimate Partner Violence: Not on file   Housing Stability: Unknown (08/13/2022)    Housing Stability Vital Sign    . Unable to Pay for Housing in the Last Year: Not on file    . Number of Places  Lived in the Last Year: Not on file    . Unstable Housing in the Last Year: No       Family History   Problem Relation Age of Onset   . Osteoporosis Mother    . Pancreatic Cancer Mother 44   . Dementia Mother    . Hypertension Mother    . Dementia Father 33   . No Known Problems Sister    . No Known Problems Daughter        Allergies:  No Known Allergies    Current Medications:  Current Outpatient Medications   Medication Sig Dispense Refill   . metoprolol succinate (TOPROL XL) 25 MG extended release tablet Take 1 tablet by mouth daily 15 tablet 1   . Multiple Vitamin (MULTIVITAMIN ADULT PO) Take by mouth daily     . Cyanocobalamin (VITAMIN B 12 PO) Take by mouth       No current  facility-administered medications for this visit.       Immunizations:  Immunization status: up to date and documented.    Review of Systems:  Review of Systems   Constitutional:  Positive for unexpected weight change. Negative for activity change, appetite change and fatigue.        Intentional wt loss with diet changes    No night sweats   HENT:  Positive for sinus pressure.    Respiratory:  Negative for shortness of breath.    Cardiovascular:  Negative for chest pain.   All other systems reviewed and are negative.    Vitals:  BP 110/70   Ht 1.6 m (5\' 3" )   Wt 52.3 kg (115 lb 6.4 oz)   BMI 20.44 kg/m     Physical Exam:  Physical Exam  Vitals reviewed.   Constitutional:       Appearance: Normal appearance. She is normal weight.   HENT:      Head: Normocephalic and atraumatic.      Right Ear: Tympanic membrane normal.      Left Ear: Tympanic membrane normal.      Nose:      Comments: Septal deviation on the left     Mouth/Throat:      Mouth: Mucous membranes are moist.      Pharynx: No oropharyngeal exudate or posterior oropharyngeal erythema.   Eyes:      Extraocular Movements: Extraocular movements intact.      Pupils: Pupils are equal, round, and reactive to light.   Cardiovascular:      Rate and Rhythm: Normal rate and regular  rhythm.      Pulses: Normal pulses.      Heart sounds: Normal heart sounds.   Pulmonary:      Effort: Pulmonary effort is normal.      Breath sounds: Normal breath sounds.   Musculoskeletal:         General: Normal range of motion.      Cervical back: Normal range of motion and neck supple.   Lymphadenopathy:      Cervical: No cervical adenopathy.   Skin:     General: Skin is warm and dry.   Neurological:      General: No focal deficit present.      Mental Status: She is alert and oriented to person, place, and time.   Psychiatric:         Mood and Affect: Mood normal.         Behavior: Behavior normal.         Thought Content: Thought content normal.         Judgment: Judgment normal.        Assessment/Plan:   Leslie Lucas was seen today for annual exam.    Diagnoses and all orders for this visit:    Wellness examination    Breast cancer screening by mammogram  -     MAM TOMO DIGITAL SCREEN BILATERAL; Future    Postmenopausal bone loss  -     DEXA BONE DENSITY AXIAL SKELETON; Future    Nasal septal deviation  -     [facility], [facility]    Chronic sinus complaints  -     [facility], Leslie Lucas    Lymphopenia  -     Electrophoresis Protein, Serum; Future  -     Vitamin B12; Future  -     Folate; Future  -     CBC  with Auto Differential; Future    Vitamin D deficiency  -     Vitamin D 25 Hydroxy; Future      Had labs and her white count was low with lymphocytosis so will need additional labs  Did have flu like symptoms over Christmas week but 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

## 2022-08-31 ENCOUNTER — Other Ambulatory Visit: Payer: Self-pay | Admitting: Family Medicine

## 2022-10-15 ENCOUNTER — Ambulatory Visit: Payer: BLUE CROSS/BLUE SHIELD | Primary: Internal Medicine

## 2022-11-07 ENCOUNTER — Telehealth (INDEPENDENT_AMBULATORY_CARE_PROVIDER_SITE_OTHER): Payer: Managed Care, Other (non HMO) | Admitting: Physician Assistant

## 2022-11-07 DIAGNOSIS — R3989 Other symptoms and signs involving the genitourinary system: Secondary | ICD-10-CM

## 2022-11-07 NOTE — Progress Notes (Signed)
     Virtual Visit via Video Note  I connected with Jordan Baker on 11/07/22 at  2:00 PM EDT by a video enabled telemedicine application and verified that I am speaking with the correct person using two identifiers.  Today's Provider: Jacquelin Hawking, MHS, PA-C Introduced myself to the patient as a PA-C and provided education on APPs in clinical practice.   Location: Patient: Jordan Baker, [place]  Provider: [facility], Cheree Ditto   I discussed the limitations of evaluation and management by telemedicine and the availability of in person appointments. The patient expressed understanding and agreed to proceed.  Chief Complaint  Patient presents with   Urinary Tract Infection    Pt states that this morning when she woke up her urine had a very strong odor. States she had pain with urination as well. States she has a history of UTI's.      Patient was informed that since she is not in [place] I am unable to provide medical advice or treatment as I am not licensed in [facility] where she is currently staying I further explained that I would need a urine sample to adequately assess her for her concerns and this is not possible due to the nature of the virtual visit and she is not able to drop one off at the office for testing I recommended that she seeks care at an Urgent Care within her local area and state she is currently in for prompt evaluation and management but if desired she can follow up with PCP office for further assessment.    Observations/Objective:  Due to the nature of the virtual visit, physical exam and observations are limited. Able to obtain the following observations:   Alert, oriented, x3  Appears comfortable, in no acute distress.  No scleral injection, no appreciated hoarseness, tachypnea, wheeze or strider. Able to maintain conversation without visible strain.  No cough appreciated during visit.   Assessment and Plan:     Problem List Items Addressed This Visit    None   Follow Up Instructions:    I discussed the assessment and treatment plan with the patient. The patient was provided an opportunity to ask questions and all were answered. The patient agreed with the plan and demonstrated an understanding of the instructions.   The patient was advised to call back or seek an in-person evaluation if the symptoms worsen or if the condition fails to improve as anticipated.  I provided 5 minutes of non-face-to-face time during this encounter.  No follow-ups on file.   I, Gwenyth Dingee E Nayson Traweek, PA-C, have reviewed all documentation for this visit. The documentation on 11/07/22 for the exam, diagnosis, procedures, and orders are all accurate and complete.   Jacquelin Hawking, MHS, PA-C [facility]

## 2022-11-07 NOTE — Progress Notes (Deleted)
          Acute Office Visit   Patient: Jordan Baker   DOB: 1961-03-07   62 y.o. Female  MRN: [id number] Visit Date: 11/07/2022  Today's healthcare provider: Oswaldo Conroy Keisha Amer, PA-C  Introduced myself to the patient as a Secondary school teacher and provided education on APPs in clinical practice.    No chief complaint on file.  Subjective    HPI    Medications: Outpatient Medications Prior to Visit  Medication Sig   ALPRAZolam (XANAX) 0.5 MG tablet Take 1 tablet (0.5 mg total) by mouth 2 (two) times daily as needed for anxiety.   APPLE CIDER VINEGAR PO Take by mouth. gummies   cetirizine (ZYRTEC) 10 MG tablet Take 1 tablet (10 mg total) by mouth daily.   clobetasol (TEMOVATE) 0.05 % external solution Apply to affected areas scalp once to twice daily as needed. Avoid face.   cyclobenzaprine (FLEXERIL) 10 MG tablet Take 1 tablet (10 mg total) by mouth 3 (three) times daily as needed.   diclofenac Sodium (VOLTAREN) 1 % GEL Apply 4 g topically 4 (four) times daily.   fluocinolone (VANOS) 0.01 % cream Apply to affected areas face/frontal hairline once to twice daily as needed for psoriasis.   fluticasone (FLONASE) 50 MCG/ACT nasal spray Use 2 spray(s) in each nostril once daily   gabapentin (NEURONTIN) 100 MG capsule Take 1 capsule (100 mg total) by mouth 2 (two) times daily.   gabapentin (NEURONTIN) 300 MG capsule Take 1 capsule (300 mg total) by mouth at bedtime.   Lactobacillus-Inulin (PROBIOTIC DIGESTIVE SUPPORT PO) Take by mouth. Raw probiotic Probiotic and preprobiotic   levothyroxine (SYNTHROID) 100 MCG tablet Take 1 tablet (100 mcg total) by mouth daily.   magic mouthwash (nystatin, lidocaine, diphenhydrAMINE) suspension Swish and spit 5mL QID prn mouth pain.   MAGNESIUM PO Take 400 mg by mouth at bedtime.   Multiple Vitamins-Minerals (ZINC PO) Take 50 mg by mouth daily.   NON FORMULARY CPAP (Device) nightly   NON FORMULARY CBD oil   pravastatin (PRAVACHOL) 40 MG tablet Take 1 tablet (40 mg  total) by mouth daily.   Psyllium (EQ DAILY FIBER) 400 MG CAPS Take by mouth.   sertraline (ZOLOFT) 100 MG tablet Take 1 tablet (100 mg total) by mouth daily.   VITAMIN D PO Take 1,000 Int'l Units/day by mouth.   VITAMIN E PO Take 180 mg by mouth daily.   No facility-administered medications prior to visit.    Review of Systems  {Labs  Heme  Chem  Endocrine  Serology  Results Review (optional):23779}   Objective    LMP 06/20/2018 Comment: no urine pregnancy needed per Dr. Randa Ngo {Show previous vital signs (optional):23777}  Physical Exam    No results found for any visits on 11/07/22.  Assessment & Plan      No follow-ups on file.

## 2022-11-26 ENCOUNTER — Encounter: Payer: Self-pay | Admitting: Family Medicine

## 2022-11-27 ENCOUNTER — Other Ambulatory Visit: Payer: Self-pay | Admitting: Family Medicine

## 2022-11-27 MED ORDER — CEPHALEXIN 250 MG PO CAPS: 250.0000 mg | ORAL_CAPSULE | Freq: Every day | ORAL | 11 refills | Status: DC | PRN

## 2022-12-02 ENCOUNTER — Ambulatory Visit: Payer: BLUE CROSS/BLUE SHIELD | Primary: Internal Medicine

## 2022-12-26 ENCOUNTER — Other Ambulatory Visit: Payer: Self-pay | Admitting: Family Medicine

## 2022-12-26 DIAGNOSIS — E7800 Pure hypercholesterolemia, unspecified: Secondary | ICD-10-CM

## 2022-12-29 NOTE — Telephone Encounter (Signed)
Requested Prescriptions  Pending Prescriptions Disp Refills   pravastatin (PRAVACHOL) 40 MG tablet [Pharmacy Med Name: Pravastatin Sodium 40 MG Oral Tablet] 90 tablet 0    Sig: Take 1 tablet by mouth once daily     Cardiovascular:  Antilipid - Statins Failed - 12/26/2022 11:12 PM      Failed - Lipid Panel in normal range within the last 12 months    Cholesterol, Total  Date Value Ref Range Status  06/17/2022 229 (H) 100 - 199 mg/dL Final   LDL Cholesterol (Calc)  Date Value Ref Range Status  06/09/2017 124 (H) mg/dL (calc) Final    Comment:    Reference range: <100 . Desirable range <100 mg/dL for primary prevention;   <70 mg/dL for patients with CHD or diabetic patients  with > or = 2 CHD risk factors. Marland Kitchen LDL-C is now calculated using the Martin-Hopkins  calculation, which is a validated novel method providing  better accuracy than the Friedewald equation in the  estimation of LDL-C.  Horald Pollen et al. Lenox Ahr. 1914;782(95): 2061-2068  (http://education.[employer].com/faq/FAQ164)    LDL Chol Calc (NIH)  Date Value Ref Range Status  06/17/2022 147 (H) 0 - 99 mg/dL Final   HDL  Date Value Ref Range Status  06/17/2022 64 >39 mg/dL Final   Triglycerides  Date Value Ref Range Status  06/17/2022 101 0 - 149 mg/dL Final         Passed - Patient is not pregnant      Passed - Valid encounter within last 12 months    Recent Outpatient Visits           1 month ago Suspected UTI   [place] [facility] Mecum, Oswaldo Conroy, PA-C   6 months ago Annual physical exam   [place] [facility] Merita Norton T, FNP   1 year 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, undifferentiated (HCC)   [place] [facility] Merita Norton T, FNP   1 year ago Annual physical exam   [place] [facility] Jacky Kindle, FNP   1 year ago Chronic idiopathic constipation   [place] [facility] Jacky Kindle, FNP       Future  Appointments             In 1 week Willeen Niece, MD [place] [facility]             gabapentin (NEURONTIN) 100 MG capsule [Pharmacy Med Name: Gabapentin 100 MG Oral Capsule] 180 capsule 0    Sig: Take 1 capsule by mouth twice daily     Neurology: Anticonvulsants - gabapentin Passed - 12/26/2022 11:12 PM      Passed - Cr in normal range and within 360 days    Creat  Date Value Ref Range Status  06/09/2017 0.71 0.50 - 1.05 mg/dL Final    Comment:    For patients >53 years of age, the reference limit for Creatinine is approximately 13% higher for people identified as African-American. .    Creatinine, Ser  Date Value Ref Range Status  06/17/2022 0.75 0.57 - 1.00 mg/dL Final         Passed - Completed PHQ-2 or PHQ-9 in the last 360 days      Passed - Valid encounter within last 12 months    Recent Outpatient Visits           1 month ago Suspected UTI   [place] [facility] Mecum,  Oswaldo Conroy, PA-C   6 months ago Annual physical exam   [facility] Merita Norton T, FNP   1 year 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, undifferentiated [facility])   [facility] Jacky Kindle, FNP   1 year ago Annual physical exam   [facility] Jacky Kindle, FNP   1 year ago Chronic idiopathic constipation   [facility] Jacky Kindle, FNP       Future Appointments             In 1 week Willeen Niece, MD [facility]

## 2023-01-06 ENCOUNTER — Ambulatory Visit: Payer: Managed Care, Other (non HMO) | Admitting: Dermatology

## 2023-01-06 ENCOUNTER — Encounter: Payer: Self-pay | Admitting: Dermatology

## 2023-01-06 VITALS — BP 137/76 | HR 113

## 2023-01-06 DIAGNOSIS — D2339 Other benign neoplasm of skin of other parts of face: Secondary | ICD-10-CM

## 2023-01-06 DIAGNOSIS — L814 Other melanin hyperpigmentation: Secondary | ICD-10-CM | POA: Diagnosis not present

## 2023-01-06 DIAGNOSIS — Z1283 Encounter for screening for malignant neoplasm of skin: Secondary | ICD-10-CM | POA: Diagnosis not present

## 2023-01-06 DIAGNOSIS — D225 Melanocytic nevi of trunk: Secondary | ICD-10-CM | POA: Diagnosis not present

## 2023-01-06 DIAGNOSIS — L821 Other seborrheic keratosis: Secondary | ICD-10-CM | POA: Diagnosis not present

## 2023-01-06 DIAGNOSIS — W908XXA Exposure to other nonionizing radiation, initial encounter: Secondary | ICD-10-CM

## 2023-01-06 DIAGNOSIS — Z85828 Personal history of other malignant neoplasm of skin: Secondary | ICD-10-CM

## 2023-01-06 DIAGNOSIS — L820 Inflamed seborrheic keratosis: Secondary | ICD-10-CM

## 2023-01-06 DIAGNOSIS — Z872 Personal history of diseases of the skin and subcutaneous tissue: Secondary | ICD-10-CM

## 2023-01-06 DIAGNOSIS — Z86018 Personal history of other benign neoplasm: Secondary | ICD-10-CM

## 2023-01-06 DIAGNOSIS — D229 Melanocytic nevi, unspecified: Secondary | ICD-10-CM

## 2023-01-06 DIAGNOSIS — L578 Other skin changes due to chronic exposure to nonionizing radiation: Secondary | ICD-10-CM

## 2023-01-06 NOTE — Patient Instructions (Addendum)
Cryotherapy Aftercare  Wash gently with soap and water everyday.   Apply Vaseline Jelly daily until healed.     Recommend daily broad spectrum sunscreen SPF 30+ to sun-exposed areas, reapply every 2 hours as needed. Call for new or changing lesions.  Staying in the shade or wearing long sleeves, sun glasses (UVA+UVB protection) and wide brim hats (4-inch brim around the entire circumference of the hat) are also recommended for sun protection.     Seborrheic Keratosis  What causes seborrheic keratoses? Seborrheic keratoses are harmless, common skin growths that first appear during adult life.  As time goes by, more growths appear.  Some people may develop a large number of them.  Seborrheic keratoses appear on both covered and uncovered body parts.  They are not caused by sunlight.  The tendency to develop seborrheic keratoses can be inherited.  They vary in color from skin-colored to gray, brown, or even black.  They can be either smooth or have a rough, warty surface.   Seborrheic keratoses are superficial and look as if they were stuck on the skin.  Under the microscope this type of keratosis looks like layers upon layers of skin.  That is why at times the top layer may seem to fall off, but the rest of the growth remains and re-grows.    Treatment Seborrheic keratoses do not need to be treated, but can easily be removed in the office.  Seborrheic keratoses often cause symptoms when they rub on clothing or jewelry.  Lesions can be in the way of shaving.  If they become inflamed, they can cause itching, soreness, or burning.  Removal of a seborrheic keratosis can be accomplished by freezing, burning, or surgery. If any spot bleeds, scabs, or grows rapidly, please return to have it checked, as these can be an indication of a skin cancer.   Melanoma ABCDEs  Melanoma is the most dangerous type of skin cancer, and is the leading cause of death from skin disease.  You are more likely to develop  melanoma if you: Have light-colored skin, light-colored eyes, or red or blond hair Spend a lot of time in the sun Tan regularly, either outdoors or in a tanning bed Have had blistering sunburns, especially during childhood Have a close family member who has had a melanoma Have atypical moles or large birthmarks  Early detection of melanoma is key since treatment is typically straightforward and cure rates are extremely high if we catch it early.   The first sign of melanoma is often a change in a mole or a new dark spot.  The ABCDE system is a way of remembering the signs of melanoma.  A for asymmetry:  The two halves do not match. B for border:  The edges of the growth are irregular. C for color:  A mixture of colors are present instead of an even brown color. D for diameter:  Melanomas are usually (but not always) greater than 6mm - the size of a pencil eraser. E for evolution:  The spot keeps changing in size, shape, and color.  Please check your skin once per month between visits. You can use a small mirror in front and a large mirror behind you to keep an eye on the back side or your body.   If you see any new or changing lesions before your next follow-up, please call to schedule a visit.  Please continue daily skin protection including broad spectrum sunscreen SPF 30+ to sun-exposed areas, reapplying every  2 hours as needed when you're outdoors.   Staying in the shade or wearing long sleeves, sun glasses (UVA+UVB protection) and wide brim hats (4-inch brim around the entire circumference of the hat) are also recommended for sun protection.     Counseling for BBL / IPL / Laser and Coordination of Care Discussed the treatment option of Broad Band Light (BBL) /Intense Pulsed Light (IPL)/ Laser for skin discoloration, including brown spots and redness.  Typically we recommend at least 1-3 treatment sessions about 5-8 weeks apart for best results.  Cannot have tanned skin when BBL  performed, and regular use of sunscreen is advised after the procedure to help maintain results. The patient's condition may also require "maintenance treatments" in the future.  The fee for BBL / laser treatments is $350 per treatment session for the whole face.  A fee can be quoted for other parts of the body.  Insurance typically does not pay for BBL/laser treatments and therefore the fee is an out-of-poc   Due to recent changes in healthcare laws, you may see results of your pathology and/or laboratory studies on MyChart before the doctors have had a chance to review them. We understand that in some cases there may be results that are confusing or concerning to you. Please understand that not all results are received at the same time and often the doctors may need to interpret multiple results in order to provide you with the best plan of care or course of treatment. Therefore, we ask that you please give [place] 2 business days to thoroughly review all your results before contacting the office for clarification. Should we see a critical lab result, you will be contacted sooner.   If You Need Anything After Your Visit  If you have any questions or concerns for your doctor, please call our main line at [phone] and press option 4 to reach your doctor's medical assistant. If no one answers, please leave a voicemail as directed and we will return your call as soon as possible. Messages left after 4 pm will be answered the following business day.   You may also send [place] a message via MyChart. We typically respond to MyChart messages within 1-2 business days.  For prescription refills, please ask your pharmacy to contact our office. Our fax number is [phone].  If you have an urgent issue when the clinic is closed that cannot wait until the next business day, you can page your doctor at the number below.    Please note that while we do our best to be available for urgent issues outside of office  hours, we are not available 24/7.   If you have an urgent issue and are unable to reach [place], you may choose to seek medical care at your doctor's office, retail clinic, urgent care center, or emergency room.  If you have a medical emergency, please immediately call 911 or go to the emergency department.  Pager Numbers  - Dr. Gwen Pounds: [phone]  - Dr. Neale Burly: [phone]  - Dr. Roseanne Reno: [phone]  In the event of inclement weather, please call our main line at [phone] for an update on the status of any delays or closures.  Dermatology Medication Tips: Please keep the boxes that topical medications come in in order to help keep track of the instructions about where and how to use these. Pharmacies typically print the medication instructions only on the boxes and not directly on the medication tubes.   If your medication is  too expensive, please contact our office at [phone] option 4 or send [place] a message through MyChart.   We are unable to tell what your co-pay for medications will be in advance as this is different depending on your insurance coverage. However, we may be able to find a substitute medication at lower cost or fill out paperwork to get insurance to cover a needed medication.   If a prior authorization is required to get your medication covered by your insurance company, please allow [place] 1-2 business days to complete this process.  Drug prices often vary depending on where the prescription is filled and some pharmacies may offer cheaper prices.  The website www.goodrx.com contains coupons for medications through different pharmacies. The prices here do not account for what the cost may be with help from insurance (it may be cheaper with your insurance), but the website can give you the price if you did not use any insurance.  - You can print the associated coupon and take it with your prescription to the pharmacy.  - You may also stop by our office during regular  business hours and pick up a GoodRx coupon card.  - If you need your prescription sent electronically to a different pharmacy, notify our office through [facility] or by phone at [phone] option 4.     Si Usted Necesita Algo Despus de Su Visita  Tambin puede enviarnos un mensaje a travs de Clinical cytogeneticist. Por lo general respondemos a los mensajes de MyChart en el transcurso de 1 a 2 das hbiles.  Para renovar recetas, por favor pida a su farmacia que se ponga en contacto con nuestra oficina. Annie Sable de fax es [place] [phone].  Si tiene un asunto urgente cuando la clnica est cerrada y que no puede esperar hasta el siguiente da hbil, puede llamar/localizar a su doctor(a) al nmero que aparece a continuacin.   Por favor, tenga en cuenta que aunque hacemos todo lo posible para estar disponibles para asuntos urgentes fuera del horario de [place], no estamos disponibles las 24 horas del da, los 7 [address]  [address] de la [place].   Si tiene un problema urgente y no puede comunicarse con nosotros, puede optar por buscar atencin mdica  en el consultorio de su doctor(a), en una clnica privada, en un centro de atencin urgente o en una sala de emergencias.  Si tiene Engineer, drilling, por favor llame inmediatamente al 911 o vaya a la sala de emergencias.  Nmeros de bper  - Dr. Gwen Pounds: [phone]  - Dra. Moye: [phone]  - Dra. Roseanne Reno: [phone]  En caso de inclemencias del [place], por favor llame a Lacy Duverney principal al [phone] para una actualizacin sobre el [place] de cualquier retraso o cierre.  Consejos para la medicacin en dermatologa: Por favor, guarde las cajas en las que vienen los medicamentos de uso tpico para ayudarle a seguir las instrucciones sobre dnde y cmo usarlos. Las farmacias generalmente imprimen las instrucciones del medicamento slo en las cajas y no directamente en los tubos del [place].   Si su medicamento es muy caro, por  favor, pngase en contacto con Rolm Gala llamando al [phone] y presione la opcin 4 o envenos un mensaje a travs de Clinical cytogeneticist.   No podemos decirle cul ser su copago por los medicamentos por adelantado ya que esto es diferente dependiendo de la cobertura de su seguro. Sin embargo, es posible que podamos encontrar un medicamento sustituto a Audiological scientist un formulario para que el  seguro cubra el medicamento que se considera necesario.   Si se requiere una autorizacin previa para que su compaa de seguros [place] su medicamento, por favor permtanos de 1 a 2 das hbiles para completar [address]  Los precios de los medicamentos varan con frecuencia dependiendo del Environmental consultant de dnde se surte la receta y alguna farmacias pueden ofrecer precios ms baratos.  El sitio web www.goodrx.com tiene cupones para medicamentos de Health and safety inspector. Los precios aqu no tienen en cuenta lo que podra costar con la ayuda del seguro (puede ser ms barato con su seguro), pero el sitio web puede darle el precio si no utiliz Tourist information centre manager.  - Puede imprimir el cupn correspondiente y llevarlo con su receta a la farmacia.  - Tambin puede pasar por nuestra oficina durante el horario de atencin regular y Education officer, museum una tarjeta de cupones de GoodRx.  - Si necesita que su receta se enve electrnicamente a una farmacia diferente, informe a nuestra oficina a travs de MyChart de [place] o por telfono llamando al [phone] y presione la opcin 4.

## 2023-01-06 NOTE — Progress Notes (Signed)
Follow-Up Visit   Subjective  Jordan Baker is a 62 y.o. female who presents for the following: Skin Cancer Screening and Full Body Skin Exam. HxBCC, HxDN. HxAK Scaly area of concern on face- has cleared up and new irritated spot on arm.   The patient presents for Total-Body Skin Exam (TBSE) for skin cancer screening and mole check. The patient has spots, moles and lesions to be evaluated, some may be new or changing and the patient has concerns that these could be cancer.    The following portions of the chart were reviewed this encounter and updated as appropriate: medications, allergies, medical history  Review of Systems:  No other skin or systemic complaints except as noted in HPI or Assessment and Plan.  Objective  Well appearing patient in no apparent distress; mood and affect are within normal limits.  A full examination was performed including scalp, head, eyes, ears, nose, lips, neck, chest, axillae, abdomen, back, buttocks, bilateral upper extremities, bilateral lower extremities, hands, feet, fingers, toes, fingernails, and toenails. All findings within normal limits unless otherwise noted below.   Relevant physical exam findings are noted in the Assessment and Plan.  Left Antecubital Fossa x1 Erythematous keratotic or waxy stuck-on papule    Assessment & Plan   History of Basal Cell Carcinoma of the Skin.  - No evidence of recurrence today of the right nasal dorsum (2014) - Recommend regular full body skin exams - Recommend daily broad spectrum sunscreen SPF 30+ to sun-exposed areas, reapply every 2 hours as needed.  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 No evidence of recurrence today Recommend regular full body skin exams Recommend daily broad spectrum sunscreen SPF 30+ to sun-exposed areas, reapply every 2 hours as needed.  Call if any new or changing  lesions are noted between office visits   LENTIGINES, SEBORRHEIC KERATOSES, HEMANGIOMAS - Benign normal skin lesions - Benign-appearing - Call for any changes  ACTINIC DAMAGE - Chronic condition, secondary to cumulative UV/sun exposure - diffuse scaly erythematous macules with underlying dyspigmentation - Recommend daily broad spectrum sunscreen SPF 30+ to sun-exposed areas, reapply every 2 hours as needed.  - Staying in the shade or wearing long sleeves, sun glasses (UVA+UVB protection) and wide brim hats (4-inch brim around the entire circumference of the hat) are also recommended for sun protection.  - Call for new or changing lesions.  SKIN CANCER SCREENING PERFORMED TODAY.  MELANOCYTIC NEVI - Tan-brown and/or pink-flesh-colored symmetric macules and papules - Benign appearing on exam today - Observation - Call clinic for new or changing moles - Recommend daily use of broad spectrum spf 30+ sunscreen to sun-exposed areas NEVI Exam: regular brown macules at buttocks. X 4 Central lower abdomen 8 mm medium-light brown thin papule. Favor congenital nevus  Treatment Plan: Benign appearing on exam today. Recommend observation. Call clinic for new or changing moles. Recommend daily use of broad spectrum spf 30+ sunscreen to sun-exposed areas.   Blue nevus 3 mm blue gray macule at left forehead.   Benign-appearing.  Observation.  Call clinic for new or changing lesions.  Recommend daily use of broad spectrum spf 30+ sunscreen to sun-exposed areas.    LENTIGO Exam: 0.6 cm light tan macule R perioral,  scattered tan macules face Due to sun exposure  Treatment Plan: Benign-appearing, observe. Recommend daily broad spectrum sunscreen SPF 30+ to sun-exposed areas, reapply every 2 hours as  needed.  Call for any changes  Counseling for BBL / IPL / Laser and Coordination of Care Discussed the treatment option of Broad Band Light (BBL) /Intense Pulsed Light (IPL)/ Laser for skin  discoloration, including brown spots and redness.  Typically we recommend at least 1-3 treatment sessions about 5-8 weeks apart for best results.  Cannot have tanned skin when BBL performed, and regular use of sunscreen is advised after the procedure to help maintain results. The patient's condition may also require "maintenance treatments" in the future.  The fee for BBL / laser treatments is $350 per treatment session for the whole face.  A fee can be quoted for other parts of the body.  Insurance typically does not pay for BBL/laser treatments and therefore the fee is an out-of-pocket cost.  Inflamed seborrheic keratosis Left Antecubital Fossa x1  Symptomatic, irritating, patient would like treated.  Destruction of lesion - Left Antecubital Fossa x1  Destruction method: cryotherapy   Informed consent: discussed and consent obtained   Lesion destroyed using liquid nitrogen: Yes   Region frozen until ice ball extended beyond lesion: Yes   Outcome: patient tolerated procedure well with no complications   Post-procedure details: wound care instructions given   Additional details:  Prior to procedure, discussed risks of blister formation, small wound, skin dyspigmentation, or rare scar following cryotherapy. Recommend Vaseline ointment to treated areas while healing.    Return in about 1 year (around 01/06/2024) for TBSE.  I, Lawson Radar, CMA, am acting as scribe for Willeen Niece, MD.   Documentation: I have reviewed the above documentation for accuracy and completeness, and I agree with the above.  Willeen Niece, MD

## 2023-01-13 ENCOUNTER — Ambulatory Visit: Payer: BLUE CROSS/BLUE SHIELD | Primary: Internal Medicine

## 2023-03-04 ENCOUNTER — Encounter: Payer: BLUE CROSS/BLUE SHIELD | Primary: Internal Medicine

## 2023-03-30 ENCOUNTER — Other Ambulatory Visit: Payer: Self-pay | Admitting: Family Medicine

## 2023-03-31 ENCOUNTER — Encounter: Payer: Self-pay | Admitting: Family Medicine

## 2023-03-31 ENCOUNTER — Ambulatory Visit (INDEPENDENT_AMBULATORY_CARE_PROVIDER_SITE_OTHER): Payer: Managed Care, Other (non HMO) | Admitting: Family Medicine

## 2023-03-31 VITALS — BP 102/76 | HR 118 | Ht 62.0 in | Wt 160.5 lb

## 2023-03-31 DIAGNOSIS — F4001 Agoraphobia with panic disorder: Secondary | ICD-10-CM | POA: Diagnosis not present

## 2023-03-31 DIAGNOSIS — M350C Sjogren syndrome with dental involvement: Secondary | ICD-10-CM | POA: Diagnosis not present

## 2023-03-31 DIAGNOSIS — Z23 Encounter for immunization: Secondary | ICD-10-CM

## 2023-03-31 DIAGNOSIS — G1119 Other early-onset cerebellar ataxia: Secondary | ICD-10-CM

## 2023-03-31 DIAGNOSIS — E7849 Other hyperlipidemia: Secondary | ICD-10-CM | POA: Diagnosis not present

## 2023-03-31 MED ORDER — ALPRAZOLAM 0.5 MG PO TABS
0.5000 mg | ORAL_TABLET | Freq: Two times a day (BID) | ORAL | 1 refills | Status: DC | PRN
Start: 2023-03-31 — End: 2023-07-03

## 2023-03-31 MED ORDER — PREDNISONE 10 MG PO TABS
10.0000 mg | ORAL_TABLET | Freq: Every day | ORAL | 0 refills | Status: DC
Start: 2023-03-31 — End: 2024-01-25

## 2023-03-31 MED ORDER — ROSUVASTATIN CALCIUM 10 MG PO TABS
10.0000 mg | ORAL_TABLET | Freq: Every day | ORAL | 3 refills | Status: DC
Start: 2023-03-31 — End: 2024-01-25

## 2023-03-31 MED ORDER — GABAPENTIN 100 MG PO CAPS
100.0000 mg | ORAL_CAPSULE | Freq: Three times a day (TID) | ORAL | 3 refills | Status: DC
Start: 2023-03-31 — End: 2023-07-10

## 2023-03-31 MED ORDER — SERTRALINE HCL 100 MG PO TABS: 100.0000 mg | ORAL_TABLET | Freq: Every day | ORAL | 3 refills | Status: DC

## 2023-03-31 NOTE — Assessment & Plan Note (Signed)
Chronic, stable Request for refills for gaba and pred to assist

## 2023-03-31 NOTE — Assessment & Plan Note (Signed)
Chronic, stable Request for BID PRN benzo to further assist mgmt Pt is aware of risks of psychoactive medication use to include increased sedation, respiratory suppression, falls, extrapyramidal movements,  dependence and cardiovascular events.  Pt would like to continue treatment as benefit determined to outweigh risk.

## 2023-03-31 NOTE — Assessment & Plan Note (Signed)
Chronic, stable Request for refills for gaba and pred to assist Continues to have complaints of dry mouth

## 2023-03-31 NOTE — Assessment & Plan Note (Signed)
Chronic, increase statin Request for low dose CT for coronary calcium screening recommend diet low in saturated fat and regular exercise - 30 min at least 5 times per week

## 2023-03-31 NOTE — Progress Notes (Signed)
Established patient visit   Patient: Jordan Baker   DOB: 1960/12/18   62 y.o. Female  MRN: [id number] Visit Date: 03/31/2023  Today's healthcare provider: Jacky Kindle, FNP  Introduced to nurse practitioner role and practice setting.  All questions answered.  Discussed provider/patient relationship and expectations.  Subjective     Medications: Outpatient Medications Prior to Visit  Medication Sig   cephALEXin (KEFLEX) 250 MG capsule Take 1 capsule (250 mg total) by mouth daily as needed.   APPLE CIDER VINEGAR PO Take by mouth. gummies   cetirizine (ZYRTEC) 10 MG tablet Take 1 tablet (10 mg total) by mouth daily.   clobetasol (TEMOVATE) 0.05 % external solution Apply to affected areas scalp once to twice daily as needed. Avoid face.   cyclobenzaprine (FLEXERIL) 10 MG tablet Take 1 tablet (10 mg total) by mouth 3 (three) times daily as needed.   diclofenac Sodium (VOLTAREN) 1 % GEL Apply 4 g topically 4 (four) times daily.   fluocinolone (VANOS) 0.01 % cream Apply to affected areas face/frontal hairline once to twice daily as needed for psoriasis.   fluticasone (FLONASE) 50 MCG/ACT nasal spray Use 2 spray(s) in each nostril once daily   Lactobacillus-Inulin (PROBIOTIC DIGESTIVE SUPPORT PO) Take by mouth. Raw probiotic Probiotic and preprobiotic   levothyroxine (SYNTHROID) 100 MCG tablet Take 1 tablet (100 mcg total) by mouth daily.   magic mouthwash (nystatin, lidocaine, diphenhydrAMINE) suspension Swish and spit 5mL QID prn mouth pain.   MAGNESIUM PO Take 400 mg by mouth at bedtime.   Multiple Vitamins-Minerals (ZINC PO) Take 50 mg by mouth daily.   NON FORMULARY CPAP (Device) nightly   NON FORMULARY CBD oil   Psyllium (EQ DAILY FIBER) 400 MG CAPS Take by mouth.   [DISCONTINUED] ALPRAZolam (XANAX) 0.5 MG tablet Take 1 tablet (0.5 mg total) by mouth 2 (two) times daily as needed for anxiety.   [DISCONTINUED] gabapentin (NEURONTIN) 100 MG capsule Take 1 capsule by mouth twice  daily   [DISCONTINUED] gabapentin (NEURONTIN) 300 MG capsule Take 1 capsule (300 mg total) by mouth at bedtime.   [DISCONTINUED] pravastatin (PRAVACHOL) 40 MG tablet Take 1 tablet by mouth once daily   [DISCONTINUED] sertraline (ZOLOFT) 100 MG tablet Take 1 tablet (100 mg total) by mouth daily.   [DISCONTINUED] VITAMIN D PO Take 1,000 Int'l Units/day by mouth.   [DISCONTINUED] VITAMIN E PO Take 180 mg by mouth daily.   No facility-administered medications prior to visit.     Objective    BP 102/76 (BP Location: Right Arm, Patient Position: Sitting, Cuff Size: Normal)   Pulse (!) 118   Ht 5\' 2"  (1.575 m)   Wt 160 lb 8 oz (72.8 kg)   LMP 06/20/2018 Comment: no urine pregnancy needed per Dr. Randa Ngo  BMI 29.36 kg/m   Physical Exam Vitals and nursing note reviewed.  Constitutional:      General: She is not in acute distress.    Appearance: Normal appearance. She is overweight. She is not ill-appearing, toxic-appearing or diaphoretic.  HENT:     Head: Normocephalic and atraumatic.  Cardiovascular:     Rate and Rhythm: Regular rhythm. Tachycardia present.     Pulses: Normal pulses.     Heart sounds: Normal heart sounds. No murmur heard.    No friction rub. No gallop.  Pulmonary:     Effort: Pulmonary effort is normal. No respiratory distress.     Breath sounds: Normal breath sounds. No stridor. No wheezing, rhonchi or rales.  Chest:     Chest wall: No tenderness.  Musculoskeletal:        General: No swelling, tenderness, deformity or signs of injury. Normal range of motion.     Right lower leg: No edema.     Left lower leg: No edema.  Skin:    General: Skin is warm and dry.     Capillary Refill: Capillary refill takes less than 2 seconds.     Coloration: Skin is not jaundiced or pale.     Findings: No bruising, erythema, lesion or rash.  Neurological:     General: No focal deficit present.     Mental Status: She is alert and oriented to person, place, and time. Mental  status is at baseline.     Cranial Nerves: No cranial nerve deficit.     Sensory: No sensory deficit.     Motor: No weakness.     Coordination: Coordination normal.  Psychiatric:        Mood and Affect: Mood normal.        Behavior: Behavior normal.        Thought Content: Thought content normal.        Judgment: Judgment normal.     No results found for any visits on 03/31/23.  Assessment & Plan     Problem List Items Addressed This Visit       Digestive   Sjogren syndrome with dental involvement (HCC)    Chronic, stable Request for refills for gaba and pred to assist Continues to have complaints of dry mouth      Relevant Medications   gabapentin (NEURONTIN) 100 MG capsule   predniSONE (DELTASONE) 10 MG tablet     Nervous and Auditory   Ramsay Hunt cerebellar syndrome (HCC)    Chronic, stable Request for refills for gaba and pred to assist      Relevant Medications   gabapentin (NEURONTIN) 100 MG capsule   predniSONE (DELTASONE) 10 MG tablet     Other   Familial hyperlipidemia    Chronic, increase statin Request for low dose CT for coronary calcium screening recommend diet low in saturated fat and regular exercise - 30 min at least 5 times per week       Relevant Medications   rosuvastatin (CRESTOR) 10 MG tablet   Other Relevant Orders   CT CARDIAC SCORING (SELF PAY ONLY)   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  Pt would like to continue treatment as benefit determined to outweigh risk.         Relevant Medications   ALPRAZolam (XANAX) 0.5 MG tablet   sertraline (ZOLOFT) 100 MG tablet   Other Visit Diagnoses     Flu vaccine need       Relevant Orders   Flu vaccine trivalent  PF, 6mos and older(Flulaval,Afluria,Fluarix,Fluzone)      No follow-ups on file.     Leilani Merl, FNP, have reviewed all documentation for this visit. The documentation on 03/31/23 for the exam, diagnosis, procedures, and orders are all accurate and complete.  Jacky Kindle, FNP  [facility] [phone] (phone) [phone] (fax)  [facility]

## 2023-04-03 ENCOUNTER — Ambulatory Visit
Admission: RE | Admit: 2023-04-03 | Discharge: 2023-04-03 | Disposition: A | Discharge status: Home / Self Care | Payer: Managed Care, Other (non HMO) | Source: Ambulatory Visit | Attending: Family Medicine | Admitting: Family Medicine

## 2023-04-03 DIAGNOSIS — E7849 Other hyperlipidemia: Secondary | ICD-10-CM | POA: Insufficient documentation

## 2023-04-17 ENCOUNTER — Telehealth: Payer: Self-pay | Admitting: Family Medicine

## 2023-04-17 NOTE — Telephone Encounter (Signed)
[facility]  is wanting to know if they can change manufacturer of  Levothyroxin 100 mcg??

## 2023-04-20 ENCOUNTER — Telehealth: Payer: Self-pay | Admitting: Family Medicine

## 2023-04-20 DIAGNOSIS — E034 Atrophy of thyroid (acquired): Secondary | ICD-10-CM

## 2023-04-20 NOTE — Telephone Encounter (Signed)
[facility] is requesting prescription refill levothyroxine (SYNTHROID) 100 MCG tablet  Please advise

## 2023-04-21 MED ORDER — LEVOTHYROXINE SODIUM 100 MCG PO TABS: 100.0000 ug | ORAL_TABLET | Freq: Every day | ORAL | 2 refills | Status: DC

## 2023-04-21 NOTE — Telephone Encounter (Signed)
[facility] no answer was on hold for 15 minutes. Will try another time.

## 2023-04-21 NOTE — Telephone Encounter (Signed)
Refilled

## 2023-04-22 NOTE — Telephone Encounter (Signed)
Left verbal on provider vm at [facility]

## 2023-05-19 ENCOUNTER — Telehealth: Payer: Self-pay

## 2023-05-19 NOTE — Telephone Encounter (Signed)
Pt requesting call back to schedule colonoscpy

## 2023-05-20 NOTE — Telephone Encounter (Signed)
Message left for patient to return my call.  

## 2023-05-20 NOTE — Telephone Encounter (Signed)
Patient called back to schedule colonoscopy.  ?

## 2023-05-20 NOTE — Telephone Encounter (Signed)
Spoken to patient and she stated she would like to wait until beginning of next year

## 2023-05-27 ENCOUNTER — Other Ambulatory Visit: Payer: Self-pay | Admitting: Family Medicine

## 2023-05-27 ENCOUNTER — Encounter: Payer: Self-pay | Admitting: Family Medicine

## 2023-05-27 DIAGNOSIS — Z0000 Encounter for general adult medical examination without abnormal findings: Secondary | ICD-10-CM

## 2023-05-27 DIAGNOSIS — R7303 Prediabetes: Secondary | ICD-10-CM

## 2023-06-27 LAB — COMPREHENSIVE METABOLIC PANEL
ALT: 17 [IU]/L (ref 0–32)
AST: 17 [IU]/L (ref 0–40)
Albumin: 4.4 g/dL (ref 3.9–4.9)
Alkaline Phosphatase: 97 [IU]/L (ref 44–121)
BUN/Creatinine Ratio: 16 (ref 12–28)
BUN: 13 mg/dL (ref 8–27)
Bilirubin Total: 0.3 mg/dL (ref 0.0–1.2)
CO2: 24 mmol/L (ref 20–29)
Calcium: 9.5 mg/dL (ref 8.7–10.3)
Chloride: 101 mmol/L (ref 96–106)
Creatinine, Ser: 0.8 mg/dL (ref 0.57–1.00)
Globulin, Total: 2.1 g/dL (ref 1.5–4.5)
Glucose: 91 mg/dL (ref 70–99)
Potassium: 4.2 mmol/L (ref 3.5–5.2)
Sodium: 140 mmol/L (ref 134–144)
Total Protein: 6.5 g/dL (ref 6.0–8.5)
eGFR: 84 mL/min/{1.73_m2} (ref >59)

## 2023-06-27 LAB — CBC WITH DIFFERENTIAL/PLATELET
Basophils Absolute: 0.1 10*3/uL (ref 0.0–0.2)
Basos: 1 %
EOS (ABSOLUTE): 0.1 10*3/uL (ref 0.0–0.4)
Eos: 2 %
Hematocrit: 40.8 % (ref 34.0–46.6)
Hemoglobin: 13.1 g/dL (ref 11.1–15.9)
Immature Grans (Abs): 0 10*3/uL (ref 0.0–0.1)
Immature Granulocytes: 0 %
Lymphocytes Absolute: 1.7 10*3/uL (ref 0.7–3.1)
Lymphs: 31 %
MCH: 29.6 pg (ref 26.6–33.0)
MCHC: 32.1 g/dL (ref 31.5–35.7)
MCV: 92 fL (ref 79–97)
Monocytes Absolute: 0.4 10*3/uL (ref 0.1–0.9)
Monocytes: 8 %
Neutrophils Absolute: 3.2 10*3/uL (ref 1.4–7.0)
Neutrophils: 58 %
Platelets: 311 10*3/uL (ref 150–450)
RBC: 4.42 x10E6/uL (ref 3.77–5.28)
RDW: 12.1 % (ref 11.7–15.4)
WBC: 5.5 10*3/uL (ref 3.4–10.8)

## 2023-06-27 LAB — LIPID PANEL
Chol/HDL Ratio: 3.3 {ratio} (ref 0.0–4.4)
Cholesterol, Total: 191 mg/dL (ref 100–199)
HDL: 58 mg/dL (ref >39)
LDL Chol Calc (NIH): 107 mg/dL — ABNORMAL HIGH (ref 0–99)
Triglycerides: 146 mg/dL (ref 0–149)
VLDL Cholesterol Cal: 26 mg/dL (ref 5–40)

## 2023-06-27 LAB — HEMOGLOBIN A1C
Est. average glucose Bld gHb Est-mCnc: 120 mg/dL
Hgb A1c MFr Bld: 5.8 % — ABNORMAL HIGH (ref 4.8–5.6)

## 2023-06-27 LAB — TSH: TSH: 0.522 u[IU]/mL (ref 0.450–4.500)

## 2023-07-03 ENCOUNTER — Encounter: Payer: Managed Care, Other (non HMO) | Admitting: Family Medicine

## 2023-07-03 ENCOUNTER — Encounter: Payer: Self-pay | Admitting: Family Medicine

## 2023-07-03 ENCOUNTER — Ambulatory Visit (INDEPENDENT_AMBULATORY_CARE_PROVIDER_SITE_OTHER): Payer: Managed Care, Other (non HMO) | Admitting: Family Medicine

## 2023-07-03 VITALS — BP 120/70 | HR 101 | Ht 62.0 in | Wt 163.0 lb

## 2023-07-03 DIAGNOSIS — I10 Essential (primary) hypertension: Secondary | ICD-10-CM

## 2023-07-03 DIAGNOSIS — E034 Atrophy of thyroid (acquired): Secondary | ICD-10-CM

## 2023-07-03 DIAGNOSIS — Z79899 Other long term (current) drug therapy: Secondary | ICD-10-CM

## 2023-07-03 DIAGNOSIS — E7849 Other hyperlipidemia: Secondary | ICD-10-CM

## 2023-07-03 DIAGNOSIS — Z860100 Personal history of colon polyps, unspecified: Secondary | ICD-10-CM | POA: Insufficient documentation

## 2023-07-03 DIAGNOSIS — Z0000 Encounter for general adult medical examination without abnormal findings: Secondary | ICD-10-CM

## 2023-07-03 DIAGNOSIS — R7303 Prediabetes: Secondary | ICD-10-CM | POA: Insufficient documentation

## 2023-07-03 DIAGNOSIS — F411 Generalized anxiety disorder: Secondary | ICD-10-CM

## 2023-07-03 DIAGNOSIS — M350C Sjogren syndrome with dental involvement: Secondary | ICD-10-CM

## 2023-07-03 DIAGNOSIS — G1119 Other early-onset cerebellar ataxia: Secondary | ICD-10-CM | POA: Diagnosis not present

## 2023-07-03 DIAGNOSIS — E7800 Pure hypercholesterolemia, unspecified: Secondary | ICD-10-CM

## 2023-07-03 DIAGNOSIS — F4001 Agoraphobia with panic disorder: Secondary | ICD-10-CM

## 2023-07-03 DIAGNOSIS — D3A8 Other benign neuroendocrine tumors: Secondary | ICD-10-CM | POA: Insufficient documentation

## 2023-07-03 MED ORDER — ALPRAZOLAM 0.5 MG PO TABS: 0.5000 mg | ORAL_TABLET | Freq: Two times a day (BID) | ORAL | 1 refills | Status: DC | PRN

## 2023-07-03 NOTE — Assessment & Plan Note (Signed)
Chronic, returned Hx x 10 years Continue to recommend balanced, lower carb meals. Smaller meal size, adding snacks. Choosing water as drink of choice and increasing purposeful exercise. Defer med start

## 2023-07-03 NOTE — Assessment & Plan Note (Signed)
Chronic, stable Continue 100 mcg synthroid

## 2023-07-03 NOTE — Assessment & Plan Note (Signed)
Chronic, stable No acute concerns

## 2023-07-03 NOTE — Assessment & Plan Note (Signed)
Chronic, stable on home readings Known GAD with panic Considerable white coat HTN Continue to monitor

## 2023-07-03 NOTE — Assessment & Plan Note (Signed)
Chronic, improved Continues on Crestor 10 The 10-year ASCVD risk score (Arnett DK, et al., 2019) is: 3%

## 2023-07-03 NOTE — Assessment & Plan Note (Signed)
Acute on chronic, request for refills of xanax with zoloft Concern regarding transition to new PCP with use of xanax 0.5 mg BID PRN    07/03/2023    3:38 PM 07/01/2022    3:16 PM 09/16/2021    3:59 PM  PHQ9 SCORE ONLY  PHQ-9 Total Score 2 4 6       07/03/2023    3:38 PM  GAD 7 : Generalized Anxiety Score  Nervous, Anxious, on Edge 1  Control/stop worrying 0  Worry too much - different things 1  Trouble relaxing 1  Restless 0  Easily annoyed or irritable 0  Afraid - awful might happen 0  Total GAD 7 Score 3  Anxiety Difficulty Not difficult at all    Denies SI or HI

## 2023-07-03 NOTE — Assessment & Plan Note (Signed)
Due for f/u colon cancer screening

## 2023-07-03 NOTE — Progress Notes (Signed)
Complete physical exam   Patient: Jordan Baker   DOB: 08-24-1960   62 y.o. Female  MRN: [id number] Visit Date: 07/03/2023  Today's healthcare provider: Jacky Kindle, FNP  Introduced to nurse practitioner role and practice setting.  All questions answered.  Discussed provider/patient relationship and expectations.  Chief Complaint  Patient presents with   Annual Exam   Subjective    Jordan Baker is a 62 y.o. female who presents today for a complete physical exam.  She reports consuming a general diet. The patient does not participate in regular exercise at present. She generally feels well. She reports sleeping well. She does have additional problems to discuss today.   HPI HPI   Last pap-2023 Last edited by Shelly Bombard, CMA on 07/03/2023  3:36 PM.      BP "normal at home"  Past Medical History:  Diagnosis Date   Actinic keratosis 10/26/2009   Right upper arm.   Anxiety    Basal cell carcinoma 07/12/2013   Right nasal dorsum. Nodular pattern.   Dysplastic nevus 11/05/2007   Left thigh. Moderate atypia. Close to surgical margin.   GERD (gastroesophageal reflux disease)    Hyperlipidemia    Hypothyroidism    Past Surgical History:  Procedure Laterality Date   BIOPSY RECTAL     CESAREAN SECTION  1990/1993   x2   COLONOSCOPY WITH PROPOFOL N/A 06/25/2018   Procedure: COLONOSCOPY WITH PROPOFOL;  Surgeon: Wyline Mood, MD;  Location: [facility] ENDOSCOPY;  Service: Gastroenterology;  Laterality: N/A;   ESOPHAGOGASTRODUODENOSCOPY (EGD) WITH PROPOFOL N/A 06/25/2018   Procedure: ESOPHAGOGASTRODUODENOSCOPY (EGD) WITH PROPOFOL;  Surgeon: Wyline Mood, MD;  Location: [facility] ENDOSCOPY;  Service: Gastroenterology;  Laterality: N/A;   EUS N/A 05/17/2015   Procedure: LOWER ENDOSCOPIC ULTRASOUND (EUS);  Surgeon: Bearl Mulberry, MD;  Location: [facility] ENDOSCOPY;  Service: Gastroenterology;  Laterality: N/A;   JOINT REPLACEMENT Left 06/2017   OVARIAN CYST SURGERY  1993    partial knee replacemt Left 06/24/2017   Social History   Socioeconomic History   Marital status: Married    Spouse name: Not on file   Number of children: 2   Years of education: Not on file   Highest education level: Associate degree: occupational, Scientist, product/process development, or vocational program  Occupational History    Employer: [employer]  Tobacco Use   Smoking status: Never   Smokeless tobacco: Never  Vaping Use   Vaping status: Never Used  Substance and Sexual Activity   Alcohol use: Yes    Alcohol/week: 0.0 - 1.0 standard drinks of alcohol    Comment: Occasional   Drug use: No   Sexual activity: Not on file  Other Topics Concern   Not on file  Social History Narrative   Not on file   Social Drivers of Health   Financial Resource Strain: Low Risk  (07/02/2023)   Overall Financial Resource Strain (CARDIA)    Difficulty of Paying Living Expenses: Not hard at all  Food Insecurity: No Food Insecurity (07/02/2023)   Hunger Vital Sign    Worried About Running Out of Food in the Last Year: Never true    Ran Out of Food in the Last Year: Never true  Transportation Needs: No Transportation Needs (07/02/2023)   PRAPARE - Administrator, Civil Service (Medical): No    Lack of Transportation (Non-Medical): No  Physical Activity: Insufficiently Active (07/02/2023)   Exercise Vital Sign    Days of Exercise per  Week: 2 days    Minutes of Exercise per Session: 10 min  Stress: Stress Concern Present (07/02/2023)   [employer] of Occupational Health - Occupational Stress Questionnaire    Feeling of Stress : To some extent  Social Connections: Unknown (07/02/2023)   Social Connection and Isolation Panel [NHANES]    Frequency of Communication with Friends and Family: More than three times a week    Frequency of Social Gatherings with Friends and Family: More than three times a week    Attends Religious Services: More than 4 times per year    Active Member  of [employer] or Organizations: Patient declined    Attends Engineer, structural: Not on file    Marital Status: Married  Catering manager Violence: Not on file   Family Status  Relation Name Status   PGM  Deceased at age 74       died from old age   Mother  Alive   Father  Deceased at age 48       Lung cancer   Sister 1 Alive   MGM  Deceased at age 85       died from old age   MGF  Deceased at age 9's       died from [place] heart failure.   PGF  Deceased at age 48       died from old age   Sister 2 Alive   Son  Alive   Son  Alive  No partnership data on file   Family History  Problem Relation Age of Onset   Breast cancer Paternal Grandmother    Cancer Paternal Grandmother        Breast Cancer   Hyperlipidemia Mother    Hypertension Mother    Hypertension Sister    Arthritis Maternal Grandfather    Allergies  Allergen Reactions   Augmentin [Amoxicillin-Pot Clavulanate] Rash   Nitrofurantoin Monohyd Macro Rash    Patient Care Team: Jacky Kindle, FNP as PCP - General (Family Medicine)   Medications: Outpatient Medications Prior to Visit  Medication Sig   APPLE CIDER VINEGAR PO Take by mouth. gummies   cephALEXin (KEFLEX) 250 MG capsule Take 1 capsule (250 mg total) by mouth daily as needed. (Patient not taking: Reported on 07/03/2023)   cetirizine (ZYRTEC) 10 MG tablet Take 1 tablet (10 mg total) by mouth daily.   clobetasol (TEMOVATE) 0.05 % external solution Apply to affected areas scalp once to twice daily as needed. Avoid face.   cyclobenzaprine (FLEXERIL) 10 MG tablet Take 1 tablet (10 mg total) by mouth 3 (three) times daily as needed.   diclofenac Sodium (VOLTAREN) 1 % GEL Apply 4 g topically 4 (four) times daily.   fluocinolone (VANOS) 0.01 % cream Apply to affected areas face/frontal hairline once to twice daily as needed for psoriasis.   fluticasone (FLONASE) 50 MCG/ACT nasal spray Use 2 spray(s) in each nostril once daily   gabapentin  (NEURONTIN) 100 MG capsule Take 1 capsule (100 mg total) by mouth 3 (three) times daily.   gabapentin (NEURONTIN) 300 MG capsule Take 300 mg by mouth 3 (three) times daily.   Lactobacillus-Inulin (PROBIOTIC DIGESTIVE SUPPORT PO) Take by mouth. Raw probiotic Probiotic and preprobiotic   levothyroxine (SYNTHROID) 100 MCG tablet Take 1 tablet (100 mcg total) by mouth daily.   MAGNESIUM PO Take 400 mg by mouth at bedtime.   Multiple Vitamins-Minerals (ZINC PO) Take 50 mg by mouth daily.   NON FORMULARY CPAP (Device)  nightly   NON FORMULARY CBD oil   predniSONE (DELTASONE) 10 MG tablet Take 1 tablet (10 mg total) by mouth daily with breakfast.   Psyllium (EQ DAILY FIBER) 400 MG CAPS Take by mouth.   rosuvastatin (CRESTOR) 10 MG tablet Take 1 tablet (10 mg total) by mouth daily.   sertraline (ZOLOFT) 100 MG tablet Take 1 tablet (100 mg total) by mouth daily.   [DISCONTINUED] ALPRAZolam (XANAX) 0.5 MG tablet Take 1 tablet (0.5 mg total) by mouth 2 (two) times daily as needed for anxiety.   [DISCONTINUED] magic mouthwash (nystatin, lidocaine, diphenhydrAMINE) suspension Swish and spit 5mL QID prn mouth pain.   No facility-administered medications prior to visit.    Review of Systems Last CBC Lab Results  Component Value Date   WBC 5.5 06/26/2023   HGB 13.1 06/26/2023   HCT 40.8 06/26/2023   MCV 92 06/26/2023   MCH 29.6 06/26/2023   RDW 12.1 06/26/2023   PLT 311 06/26/2023   Last metabolic panel Lab Results  Component Value Date   GLUCOSE 91 06/26/2023   NA 140 06/26/2023   K 4.2 06/26/2023   CL 101 06/26/2023   CO2 24 06/26/2023   BUN 13 06/26/2023   CREATININE 0.80 06/26/2023   EGFR 84 06/26/2023   CALCIUM 9.5 06/26/2023   PROT 6.5 06/26/2023   ALBUMIN 4.4 06/26/2023   LABGLOB 2.1 06/26/2023   AGRATIO 2.0 06/17/2022   BILITOT 0.3 06/26/2023   ALKPHOS 97 06/26/2023   AST 17 06/26/2023   ALT 17 06/26/2023   Last lipids Lab Results  Component Value Date   CHOL 191  06/26/2023   HDL 58 06/26/2023   LDLCALC 107 (H) 06/26/2023   TRIG 146 06/26/2023   CHOLHDL 3.3 06/26/2023   Last hemoglobin A1c Lab Results  Component Value Date   HGBA1C 5.8 (H) 06/26/2023   Last thyroid functions Lab Results  Component Value Date   TSH 0.522 06/26/2023   T4TOTAL 9.1 05/22/2020   Last vitamin D Lab Results  Component Value Date   VD25OH 56.8 06/17/2022   Last vitamin B12 and Folate Lab Results  Component Value Date   VITAMINB12 553 06/17/2022       Objective    BP 120/70 Comment: home readings  Pulse (!) 101   Ht 5\' 2"  (1.575 m)   Wt 163 lb (73.9 kg)   LMP 06/20/2018 Comment: no urine pregnancy needed per Dr. Randa Ngo  SpO2 97%   BMI 29.81 kg/m   BP Readings from Last 3 Encounters:  07/03/23 120/70  03/31/23 102/76  01/06/23 137/76   Wt Readings from Last 3 Encounters:  07/03/23 163 lb (73.9 kg)  03/31/23 160 lb 8 oz (72.8 kg)  07/01/22 156 lb (70.8 kg)   SpO2 Readings from Last 3 Encounters:  07/03/23 97%  07/01/22 99%  09/16/21 99%      Physical Exam Vitals and nursing note reviewed.  Constitutional:      General: She is awake. She is not in acute distress.    Appearance: Normal appearance. She is well-developed, well-groomed and overweight. She is not ill-appearing, toxic-appearing or diaphoretic.  HENT:     Head: Normocephalic and atraumatic.     Jaw: There is normal jaw occlusion. No trismus, tenderness, swelling or pain on movement.     Right Ear: Hearing, tympanic membrane, ear canal and external ear normal. There is no impacted cerumen.     Left Ear: Hearing, tympanic membrane, ear canal and external ear normal. There is  no impacted cerumen.     Nose: Nose normal. No congestion or rhinorrhea.     Right Turbinates: Not enlarged, swollen or pale.     Left Turbinates: Not enlarged, swollen or pale.     Right Sinus: No maxillary sinus tenderness or frontal sinus tenderness.     Left Sinus: No maxillary sinus tenderness or  frontal sinus tenderness.     Mouth/Throat:     Lips: Pink.     Mouth: Mucous membranes are moist. No injury.     Tongue: No lesions.     Pharynx: Oropharynx is clear. Uvula midline. No pharyngeal swelling, oropharyngeal exudate, posterior oropharyngeal erythema or uvula swelling.     Tonsils: No tonsillar exudate or tonsillar abscesses.  Eyes:     General: Lids are normal. Lids are everted, no foreign bodies appreciated. Vision grossly intact. Gaze aligned appropriately. No allergic shiner or visual field deficit.       Right eye: No discharge.        Left eye: No discharge.     Extraocular Movements: Extraocular movements intact.     Conjunctiva/sclera: Conjunctivae normal.     Right eye: Right conjunctiva is not injected. No exudate.    Left eye: Left conjunctiva is not injected. No exudate.    Pupils: Pupils are equal, round, and reactive to light.  Neck:     Thyroid: No thyroid mass, thyromegaly or thyroid tenderness.     Vascular: No carotid bruit.     Trachea: Trachea normal.  Cardiovascular:     Rate and Rhythm: Normal rate and regular rhythm.     Pulses: Normal pulses.          Carotid pulses are 2+ on the right side and 2+ on the left side.      Radial pulses are 2+ on the right side and 2+ on the left side.       Dorsalis pedis pulses are 2+ on the right side and 2+ on the left side.       Posterior tibial pulses are 2+ on the right side and 2+ on the left side.     Heart sounds: Normal heart sounds, S1 normal and S2 normal. No murmur heard.    No friction rub. No gallop.  Pulmonary:     Effort: Pulmonary effort is normal. No respiratory distress.     Breath sounds: Normal breath sounds and air entry. No stridor. No wheezing, rhonchi or rales.  Chest:     Chest wall: No tenderness.  Abdominal:     General: Abdomen is flat. Bowel sounds are normal. There is no distension.     Palpations: Abdomen is soft. There is no mass.     Tenderness: There is no abdominal  tenderness. There is no right CVA tenderness, left CVA tenderness, guarding or rebound.     Hernia: No hernia is present.  Genitourinary:    Comments: Exam deferred; denies complaints Musculoskeletal:        General: No swelling, tenderness, deformity or signs of injury. Normal range of motion.     Cervical back: Full passive range of motion without pain, normal range of motion and neck supple. No edema, rigidity or tenderness. No muscular tenderness.     Right lower leg: No edema.     Left lower leg: No edema.  Lymphadenopathy:     Cervical: No cervical adenopathy.     Right cervical: No superficial, deep or posterior cervical adenopathy.    Left cervical: No  superficial, deep or posterior cervical adenopathy.  Skin:    General: Skin is warm and dry.     Capillary Refill: Capillary refill takes less than 2 seconds.     Coloration: Skin is not jaundiced or pale.     Findings: No bruising, erythema, lesion or rash.  Neurological:     General: No focal deficit present.     Mental Status: She is alert and oriented to person, place, and time. Mental status is at baseline.     GCS: GCS eye subscore is 4. GCS verbal subscore is 5. GCS motor subscore is 6.     Sensory: Sensation is intact. No sensory deficit.     Motor: Motor function is intact. No weakness.     Coordination: Coordination is intact. Coordination normal.     Gait: Gait is intact. Gait normal.  Psychiatric:        Attention and Perception: Attention and perception normal.        Mood and Affect: Mood and affect normal.        Speech: Speech normal.        Behavior: Behavior normal. Behavior is cooperative.        Thought Content: Thought content normal.        Cognition and Memory: Cognition and memory normal.        Judgment: Judgment normal.     Last depression screening scores    07/03/2023    3:38 PM 07/01/2022    3:16 PM 09/16/2021    3:59 PM  PHQ 2/9 Scores  PHQ - 2 Score 1 1 1   PHQ- 9 Score 2 4 6    Last fall  risk screening    07/03/2023    3:39 PM  Fall Risk   Falls in the past year? 0  Number falls in past yr: 0  Injury with Fall? 0   Last Audit-C alcohol use screening    07/02/2023   10:20 AM  Alcohol Use Disorder Test (AUDIT)  1. How often do you have a drink containing alcohol? 2  2. How many drinks containing alcohol do you have on a typical day when you are drinking? 0  3. How often do you have six or more drinks on one occasion? 0  AUDIT-C Score 2      Patient-reported   A score of 3 or more in women, and 4 or more in men indicates increased risk for alcohol abuse, EXCEPT if all of the points are from question 1   No results found for any visits on 07/03/23.  Assessment & Plan    Routine Health Maintenance and Physical Exam  Exercise Activities and Dietary recommendations  Goals   None     Immunization History  Administered Date(s) Administered   Influenza, High Dose Seasonal PF 07/26/2020   Influenza, Seasonal, Injecte, Preservative Fre 03/31/2023   Influenza,inj,Quad PF,6+ Mos 08/06/2022   Influenza-Unspecified 03/22/2018   Janssen (J&J) SARS-COV-2 Vaccination 07/26/2020   Td 06/02/2016   Tdap 10/21/2005   Zoster Recombinant(Shingrix) 06/28/2021    Health Maintenance  Topic Date Due   COVID-19 Vaccine (2 - Janssen risk series) 08/23/2020   Zoster Vaccines- Shingrix (2 of 2) 08/23/2021   Colonoscopy  06/26/2023   MAMMOGRAM  07/28/2024   DTaP/Tdap/Td (3 - Td or Tdap) 06/02/2026   Cervical Cancer Screening (HPV/Pap Cotest)  07/02/2027   INFLUENZA VACCINE  Completed   Hepatitis C Screening  Completed   HIV Screening  Completed   HPV VACCINES  Aged Out    Discussed health benefits of physical activity, and encouraged her to engage in regular exercise appropriate for her age and condition.  Problem List Items Addressed This Visit       Cardiovascular and Mediastinum   Primary hypertension   Chronic, stable on home readings Known GAD with  panic Considerable white coat HTN Continue to monitor        Digestive   Sjogren syndrome with dental involvement (HCC)   Chronic, stable No acute concerns         Endocrine   Hypothyroidism   Chronic, stable Continue 100 mcg synthroid         Nervous and Auditory   Ramsay Hunt cerebellar syndrome (HCC)   Chronic, stable No acute concerns         Other   Annual physical exam - Primary   Things to do to keep yourself healthy  - Exercise at least 30-45 minutes a day, 3-4 days a week.  - Eat a low-fat diet with lots of fruits and vegetables, up to 7-9 servings per day.  - Seatbelts can save your life. Wear them always.  - Smoke detectors on every level of your home, check batteries every year.  - Eye Doctor - have an eye exam every 1-2 years  - Safe sex - if you may be exposed to STDs, use a condom.  - Alcohol -  If you drink, do it moderately, less than 2 drinks per day.  - Health Care Power of Attorney. Choose someone to speak for you if you are not able.  - Depression is common in our stressful world.If you're feeling down or losing interest in things you normally enjoy, please come in for a visit.  - Violence - If anyone is threatening or hurting you, please call immediately.       Chronic prescription benzodiazepine use   Familial hyperlipidemia   RESOLVED: Generalized anxiety disorder   Relevant Medications   ALPRAZolam (XANAX) 0.5 MG tablet   History of colon polyps   Due for f/u colon cancer screening       Relevant Orders   Ambulatory referral to Gastroenterology   Hypercholesterolemia   Chronic, improved Continues on Crestor 10 The 10-year ASCVD risk score (Arnett DK, et al., 2019) is: 3%       Panic disorder with agoraphobia and moderate panic attacks   Acute on chronic, request for refills of xanax with zoloft Concern regarding transition to new PCP with use of xanax 0.5 mg BID PRN    07/03/2023    3:38 PM 07/01/2022    3:16 PM 09/16/2021     3:59 PM  PHQ9 SCORE ONLY  PHQ-9 Total Score 2 4 6       07/03/2023    3:38 PM  GAD 7 : Generalized Anxiety Score  Nervous, Anxious, on Edge 1  Control/stop worrying 0  Worry too much - different things 1  Trouble relaxing 1  Restless 0  Easily annoyed or irritable 0  Afraid - awful might happen 0  Total GAD 7 Score 3  Anxiety Difficulty Not difficult at all    Denies SI or HI      Relevant Medications   ALPRAZolam (XANAX) 0.5 MG tablet   Prediabetes   Chronic, returned Hx x 10 years Continue to recommend balanced, lower carb meals. Smaller meal size, adding snacks. Choosing water as drink of choice and increasing purposeful exercise. Defer med start  Return in about 6 months (around 01/01/2024) for chonic disease management, T2DM management.    Leilani Merl, FNP, have reviewed all documentation for this visit. The documentation on 07/03/23 for the exam, diagnosis, procedures, and orders are all accurate and complete.  Jacky Kindle, FNP  [facility] [phone] (phone) [phone] (fax)  [facility]

## 2023-07-03 NOTE — Assessment & Plan Note (Signed)

## 2023-07-04 ENCOUNTER — Encounter: Payer: Self-pay | Admitting: Family Medicine

## 2023-07-05 ENCOUNTER — Other Ambulatory Visit: Payer: Self-pay | Admitting: Dermatology

## 2023-07-05 DIAGNOSIS — L309 Dermatitis, unspecified: Secondary | ICD-10-CM

## 2023-07-10 ENCOUNTER — Other Ambulatory Visit: Payer: Self-pay | Admitting: Family Medicine

## 2023-07-10 DIAGNOSIS — M350C Sjogren syndrome with dental involvement: Secondary | ICD-10-CM

## 2023-07-10 DIAGNOSIS — G1119 Other early-onset cerebellar ataxia: Secondary | ICD-10-CM

## 2023-07-10 MED ORDER — GABAPENTIN 100 MG PO CAPS: ORAL_CAPSULE | ORAL | 0 refills | Status: DC

## 2023-07-20 ENCOUNTER — Telehealth: Payer: Self-pay

## 2023-07-20 ENCOUNTER — Other Ambulatory Visit: Payer: Self-pay | Admitting: *Deleted

## 2023-07-20 ENCOUNTER — Telehealth: Payer: Self-pay | Admitting: *Deleted

## 2023-07-20 DIAGNOSIS — Z1231 Encounter for screening mammogram for malignant neoplasm of breast: Secondary | ICD-10-CM

## 2023-07-20 DIAGNOSIS — Z1211 Encounter for screening for malignant neoplasm of colon: Secondary | ICD-10-CM

## 2023-07-20 DIAGNOSIS — Z860100 Personal history of colon polyps, unspecified: Secondary | ICD-10-CM

## 2023-07-20 MED ORDER — NA SULFATE-K SULFATE-MG SULF 17.5-3.13-1.6 GM/177ML PO SOLN: 1.0000 | Freq: Once | ORAL | 0 refills | Status: AC

## 2023-07-20 NOTE — Telephone Encounter (Signed)
 Gastroenterology Pre-Procedure Review  Request Date: 08/21/2023 Requesting Physician: Dr. Therisa  PATIENT REVIEW QUESTIONS: The patient responded to the following health history questions as indicated:    1. Are you having any GI issues? no 2. Do you have a personal history of Polyps? yes (last colonoscopy was on 06/25/2018) 3. Do you have a family history of Colon Cancer or Polyps? no 4. Diabetes Mellitus? no 5. Joint replacements in the past 12 months?no 6. Major health problems in the past 3 months?no 7. Any artificial heart valves, MVP, or defibrillator?no    MEDICATIONS & ALLERGIES:    Patient reports the following regarding taking any anticoagulation/antiplatelet therapy:   Plavix, Coumadin, Eliquis, Xarelto, Lovenox, Pradaxa, Brilinta, or Effient? no Aspirin ? no  Patient confirms/reports the following medications:  Current Outpatient Medications  Medication Sig Dispense Refill   cephALEXin  (KEFLEX ) 250 MG capsule Take 1 capsule (250 mg total) by mouth daily as needed. (Patient not taking: Reported on 07/03/2023) 30 capsule 11   Na Sulfate-K Sulfate-Mg Sulf 17.5-3.13-1.6 GM/177ML SOLN Take 1 kit by mouth once for 1 dose. 354 mL 0   ALPRAZolam  (XANAX ) 0.5 MG tablet Take 1 tablet (0.5 mg total) by mouth 2 (two) times daily as needed for anxiety. 180 tablet 1   APPLE CIDER VINEGAR PO Take by mouth. gummies     cetirizine  (ZYRTEC ) 10 MG tablet Take 1 tablet (10 mg total) by mouth daily. 90 tablet 3   clobetasol  (TEMOVATE ) 0.05 % external solution APPLY  TOPICALLY TO AFFECTED AREA  OF SCALP ONCE TO TWICE DAILY AVOID  FACE, groin, axilla 50 mL 0   cyclobenzaprine  (FLEXERIL ) 10 MG tablet Take 1 tablet (10 mg total) by mouth 3 (three) times daily as needed. 90 tablet 3   diclofenac  Sodium (VOLTAREN ) 1 % GEL Apply 4 g topically 4 (four) times daily. 350 g 11   fluocinolone  (VANOS) 0.01 % cream Apply to affected areas face/frontal hairline once to twice daily as needed for psoriasis. 30 g 2    fluticasone  (FLONASE ) 50 MCG/ACT nasal spray Use 2 spray(s) in each nostril once daily 16 g 3   gabapentin  (NEURONTIN ) 100 MG capsule Take 100-300 mg (1-3 capsules) as needed nightly for chronic pain. 270 capsule 0   Lactobacillus-Inulin (PROBIOTIC DIGESTIVE SUPPORT PO) Take by mouth. Raw probiotic Probiotic and preprobiotic     levothyroxine  (SYNTHROID ) 100 MCG tablet Take 1 tablet (100 mcg total) by mouth daily. 90 tablet 2   MAGNESIUM  PO Take 400 mg by mouth at bedtime.     Multiple Vitamins-Minerals (ZINC PO) Take 50 mg by mouth daily.     NON FORMULARY CPAP (Device) nightly     NON FORMULARY CBD oil     predniSONE  (DELTASONE ) 10 MG tablet Take 1 tablet (10 mg total) by mouth daily with breakfast. 90 tablet 0   Psyllium (EQ DAILY FIBER) 400 MG CAPS Take by mouth.     rosuvastatin  (CRESTOR ) 10 MG tablet Take 1 tablet (10 mg total) by mouth daily. 90 tablet 3   sertraline  (ZOLOFT ) 100 MG tablet Take 1 tablet (100 mg total) by mouth daily. 90 tablet 3   No current facility-administered medications for this visit.    Patient confirms/reports the following allergies:  Allergies  Allergen Reactions   Augmentin  [Amoxicillin -Pot Clavulanate] Rash   Nitrofurantoin Monohyd Macro Rash    No orders of the defined types were placed in this encounter.   AUTHORIZATION INFORMATION Primary Insurance: 1D#: Group #:  Secondary Insurance: 1D#: Group #:  SCHEDULE INFORMATION: Date: 08/21/2023 Time: Location: [facility]

## 2023-07-20 NOTE — Telephone Encounter (Signed)
Patient is calling to schedule colonoscopy  

## 2023-07-20 NOTE — Telephone Encounter (Signed)
 Colonoscopy schedule with Dr Tobi Bastos on 08/21/2023

## 2023-07-20 NOTE — Telephone Encounter (Signed)
 Copied from CRM [id number]. Topic: General - Other >> Jul 20, 2023 10:30 AM Tiffany B wrote: Reason for CRM: Patient was last seen by her PCP on 07/03/2023 and was advised to follow up in 6 months. Patient states PCP suggested several doctors patient can transition too and patient would like for Dr. Lang to take over her care. Patient has a 6 month follow up with Dr. Lang on 01/18/2024. Patient would like her annual/preventive mamo orders sent to [facility], patient would like a follow up call once orders have been sent.

## 2023-07-20 NOTE — Telephone Encounter (Signed)
 Mammogram orders submitted   Ok to transition to my panel for ongoing care   We will follow up in July as scheduled

## 2023-07-21 NOTE — Telephone Encounter (Signed)
 Patient notified and she is appreciative.

## 2023-07-24 ENCOUNTER — Telehealth: Payer: Self-pay | Admitting: Family Medicine

## 2023-07-24 DIAGNOSIS — E034 Atrophy of thyroid (acquired): Secondary | ICD-10-CM

## 2023-07-24 MED ORDER — LEVOTHYROXINE SODIUM 100 MCG PO TABS: 100.0000 ug | ORAL_TABLET | Freq: Every day | ORAL | 2 refills | Status: DC

## 2023-07-24 NOTE — Telephone Encounter (Signed)
 Levothyroxine (SYNTHROID) 100 MCG tablet   Pt Catanese called and stated she would like this changed back to a 90 day supply.  Please advise

## 2023-07-31 ENCOUNTER — Telehealth: Payer: Self-pay | Admitting: Family Medicine

## 2023-07-31 DIAGNOSIS — M62830 Muscle spasm of back: Secondary | ICD-10-CM

## 2023-07-31 NOTE — Telephone Encounter (Signed)
[facility] faxed refill request for the following medications: ° °cyclobenzaprine (FLEXERIL) 10 MG tablet  ° °Please advise. °

## 2023-08-03 MED ORDER — CYCLOBENZAPRINE HCL 10 MG PO TABS
10.0000 mg | ORAL_TABLET | Freq: Three times a day (TID) | ORAL | 1 refills | Status: DC | PRN
Start: 2023-08-03 — End: 2024-01-25

## 2023-08-12 ENCOUNTER — Ambulatory Visit
Admission: RE | Admit: 2023-08-12 | Discharge: 2023-08-12 | Disposition: A | Discharge status: Home / Self Care | Payer: Managed Care, Other (non HMO) | Source: Ambulatory Visit | Attending: Family Medicine | Admitting: Family Medicine

## 2023-08-12 DIAGNOSIS — Z1231 Encounter for screening mammogram for malignant neoplasm of breast: Secondary | ICD-10-CM | POA: Insufficient documentation

## 2023-08-17 ENCOUNTER — Other Ambulatory Visit: Payer: Self-pay | Admitting: Family Medicine

## 2023-08-17 DIAGNOSIS — R928 Other abnormal and inconclusive findings on diagnostic imaging of breast: Secondary | ICD-10-CM

## 2023-08-19 ENCOUNTER — Encounter: Payer: BLUE CROSS/BLUE SHIELD | Attending: Internal Medicine | Primary: Internal Medicine

## 2023-08-20 ENCOUNTER — Encounter: Payer: Self-pay | Admitting: Gastroenterology

## 2023-08-21 ENCOUNTER — Ambulatory Visit: Payer: Managed Care, Other (non HMO) | Admitting: General Practice

## 2023-08-21 ENCOUNTER — Other Ambulatory Visit: Payer: Self-pay

## 2023-08-21 ENCOUNTER — Ambulatory Visit
Admission: RE | Admit: 2023-08-21 | Discharge: 2023-08-21 | Disposition: A | Discharge status: Home / Self Care | Payer: Managed Care, Other (non HMO) | Attending: Gastroenterology | Admitting: Gastroenterology

## 2023-08-21 ENCOUNTER — Encounter
Admission: RE | Disposition: A | Discharge status: Home / Self Care | Payer: Self-pay | Source: Home / Self Care | Attending: Gastroenterology

## 2023-08-21 DIAGNOSIS — K219 Gastro-esophageal reflux disease without esophagitis: Secondary | ICD-10-CM | POA: Insufficient documentation

## 2023-08-21 DIAGNOSIS — Z1211 Encounter for screening for malignant neoplasm of colon: Secondary | ICD-10-CM | POA: Insufficient documentation

## 2023-08-21 DIAGNOSIS — Z79890 Hormone replacement therapy (postmenopausal): Secondary | ICD-10-CM | POA: Diagnosis not present

## 2023-08-21 DIAGNOSIS — E039 Hypothyroidism, unspecified: Secondary | ICD-10-CM | POA: Diagnosis not present

## 2023-08-21 DIAGNOSIS — Z860100 Personal history of colon polyps, unspecified: Secondary | ICD-10-CM

## 2023-08-21 DIAGNOSIS — I10 Essential (primary) hypertension: Secondary | ICD-10-CM | POA: Diagnosis not present

## 2023-08-21 DIAGNOSIS — Z79899 Other long term (current) drug therapy: Secondary | ICD-10-CM | POA: Diagnosis not present

## 2023-08-21 DIAGNOSIS — G4730 Sleep apnea, unspecified: Secondary | ICD-10-CM | POA: Insufficient documentation

## 2023-08-21 HISTORY — PX: COLONOSCOPY WITH PROPOFOL: SHX5780

## 2023-08-21 SURGERY — COLONOSCOPY WITH PROPOFOL
Anesthesia: General

## 2023-08-21 MED ORDER — PROPOFOL 10 MG/ML IV BOLUS
INTRAVENOUS | Status: AC
  Filled 2023-08-21: qty 20

## 2023-08-21 MED ORDER — PROPOFOL 500 MG/50ML IV EMUL
INTRAVENOUS | Status: DC | PRN
  Administered 2023-08-21: 50 ug/kg/min via INTRAVENOUS
  Administered 2023-08-21: 80 mg via INTRAVENOUS

## 2023-08-21 MED ORDER — SODIUM CHLORIDE 0.9 % IV SOLN: INTRAVENOUS | Status: DC

## 2023-08-21 NOTE — Anesthesia Postprocedure Evaluation (Signed)
 Anesthesia Post Note  Patient: Jordan Baker  Procedure(s) Performed: COLONOSCOPY WITH PROPOFOL   Patient location during evaluation: Endoscopy Anesthesia Type: General Level of consciousness: awake and alert Pain management: pain level controlled Vital Signs Assessment: post-procedure vital signs reviewed and stable Respiratory status: spontaneous breathing, nonlabored ventilation, respiratory function stable and patient connected to nasal cannula oxygen Cardiovascular status: blood pressure returned to baseline and stable Postop Assessment: no apparent nausea or vomiting Anesthetic complications: no  No notable events documented.   Last Vitals:  Vitals:   08/21/23 0938 08/21/23 0947  BP: 117/61 129/78  Pulse: 76 70  Resp: 16 16  Temp: (!) 35.6 C   SpO2: 100% 100%    Last Pain:  Vitals:   08/21/23 0947  TempSrc:   PainSc: 0-No pain                 Debby Mines

## 2023-08-21 NOTE — Op Note (Signed)
 [facility] Gastroenterology Patient Name: Jordan Baker Procedure Date: 08/21/2023 8:59 AM MRN: [id number] Account #: [id number] Date of Birth: 11/01/1960 Admit Type: Outpatient Age: 63 Room: [facility] ENDO ROOM 4 Gender: Female Note Status: Finalized Instrument Name: Arvis [id number] Procedure:             Colonoscopy Indications:           Surveillance: Personal history of colonic polyps                         (unknown histology) on last colonoscopy more than 5                         years ago Providers:             Ruel Kung MD, MD Referring MD:          No Local Md, MD (Referring MD) Medicines:             Monitored Anesthesia Care Complications:         No immediate complications. Procedure:             Pre-Anesthesia Assessment:                        - Prior to the procedure, a History and Physical was                         performed, and patient medications, allergies and                         sensitivities were reviewed. The patient's tolerance                         of previous anesthesia was reviewed.                        - The risks and benefits of the procedure and the                         sedation options and risks were discussed with the                         patient. All questions were answered and informed                         consent was obtained.                        - ASA Grade Assessment: II - A patient with mild                         systemic disease.                        After obtaining informed consent, the colonoscope was                         passed under direct vision. Throughout the procedure,                         the patient's blood  pressure, pulse, and oxygen                         saturations were monitored continuously. The                         Colonoscope was introduced through the anus and                         advanced to the the cecum, identified by the                         appendiceal  orifice. The colonoscopy was performed                         with ease. The patient tolerated the procedure well.                         The quality of the bowel preparation was excellent.                         The ileocecal valve, appendiceal orifice, and rectum                         were photographed. Findings:      The entire examined colon appeared normal on direct and retroflexion       views. Impression:            - The entire examined colon is normal on direct and                         retroflexion views.                        - No specimens collected. Recommendation:        - Discharge patient to home (with escort).                        - Resume previous diet.                        - Continue present medications.                        - Repeat colonoscopy in 10 years for screening                         purposes. Procedure Code(s):     --- Professional ---                        [phone], Colonoscopy, flexible; diagnostic, including                         collection of specimen(s) by brushing or washing, when                         performed (separate procedure) Diagnosis Code(s):     --- Professional ---                        Z86.010,  Personal history of colonic polyps CPT copyright 2022 American Medical Association. All rights reserved. The codes documented in this report are preliminary and upon coder review may  be revised to meet current compliance requirements. Ruel Kung, MD Ruel Kung MD, MD 08/21/2023 9:36:10 AM This report has been signed electronically. Number of Addenda: 0 Note Initiated On: 08/21/2023 8:59 AM Scope Withdrawal Time: 0 hours 8 minutes 23 seconds  Total Procedure Duration: 0 hours 12 minutes 12 seconds  Estimated Blood Loss:  Estimated blood loss: none.      [facility]

## 2023-08-21 NOTE — Transfer of Care (Signed)
 Immediate Anesthesia Transfer of Care Note  Patient: Jordan Baker  Procedure(s) Performed: COLONOSCOPY WITH PROPOFOL   Patient Location: PACU  Anesthesia Type:MAC  Level of Consciousness: awake  Airway & Oxygen Therapy: Patient Spontanous Breathing  Post-op Assessment: Report given to RN and Post -op Vital signs reviewed and stable  Post vital signs: Reviewed and stable  Last Vitals:  Vitals Value Taken Time  BP    Temp    Pulse    Resp    SpO2      Last Pain:  Vitals:   08/21/23 0821  TempSrc: Temporal  PainSc: 0-No pain      Patients Stated Pain Goal: 0 (08/21/23 0821)  Complications: No notable events documented.

## 2023-08-21 NOTE — Anesthesia Preprocedure Evaluation (Signed)
 Anesthesia Evaluation  Patient identified by MRN, date of birth, ID band Patient awake    Reviewed: Allergy & Precautions, H&P , NPO status , Patient's Chart, lab work & pertinent test results  History of Anesthesia Complications Negative for: history of anesthetic complications  Airway Mallampati: III  TM Distance: <3 FB Neck ROM: full    Dental  (+) Chipped   Pulmonary neg pulmonary ROS, neg shortness of breath, sleep apnea and Continuous Positive Airway Pressure Ventilation           Cardiovascular Exercise Tolerance: Good hypertension, (-) angina (-) Past MI and (-) DOE      Neuro/Psych  PSYCHIATRIC DISORDERS      negative neurological ROS     GI/Hepatic Neg liver ROS,GERD  Controlled,,  Endo/Other  Hypothyroidism    Renal/GU      Musculoskeletal   Abdominal   Peds  Hematology negative hematology ROS (+)   Anesthesia Other Findings Past Medical History: No date: Anxiety No date: GERD (gastroesophageal reflux disease) No date: Hyperlipidemia No date: Hypothyroidism  Past Surgical History: No date: BIOPSY RECTAL 1990/1993: CESAREAN SECTION     Comment:  x2 05/17/2015: EUS; N/A     Comment:  Procedure: LOWER ENDOSCOPIC ULTRASOUND (EUS);  Surgeon:               Asberry DELENA Coffee, MD;  Location: [facility] ENDOSCOPY;                Service: Gastroenterology;  Laterality: N/A; 06/2017: JOINT REPLACEMENT; Left 1993: OVARIAN CYST SURGERY 06/24/2017: partial knee replacemt; Left  BMI    Body Mass Index:  28.17 kg/m      Reproductive/Obstetrics negative OB ROS                             Anesthesia Physical Anesthesia Plan  ASA: 2  Anesthesia Plan: General   Post-op Pain Management: Minimal or no pain anticipated   Induction: Intravenous  PONV Risk Score and Plan: 3 and Propofol  infusion, TIVA and Ondansetron   Airway Management Planned: Nasal Cannula  Additional Equipment:  None  Intra-op Plan:   Post-operative Plan:   Informed Consent: I have reviewed the patients History and Physical, chart, labs and discussed the procedure including the risks, benefits and alternatives for the proposed anesthesia with the patient or authorized representative who has indicated his/her understanding and acceptance.     Dental advisory given  Plan Discussed with: CRNA and Surgeon  Anesthesia Plan Comments: (Discussed risks of anesthesia with patient, including possibility of difficulty with spontaneous ventilation under anesthesia necessitating airway intervention, PONV, and rare risks such as cardiac or respiratory or neurological events, and allergic reactions. Discussed the role of CRNA in patient's perioperative care. Patient understands.)       Anesthesia Quick Evaluation

## 2023-08-21 NOTE — H&P (Signed)
 Ruel Kung, MD [address], [address], [place], [place], [address] [address], [address], [place], [place], [address] Phone: [phone]  Fax: [phone]  Primary Care Physician:  [facility]   Pre-Procedure History & Physical: HPI:  Jordan Baker is a 63 y.o. female is here for an colonoscopy.   Past Medical History:  Diagnosis Date   Actinic keratosis 10/26/2009   Right upper arm.   Anxiety    Basal cell carcinoma 07/12/2013   Right nasal dorsum. Nodular pattern.   Dysplastic nevus 11/05/2007   Left thigh. Moderate atypia. Close to surgical margin.   GERD (gastroesophageal reflux disease)    Hyperlipidemia    Hypothyroidism     Past Surgical History:  Procedure Laterality Date   BIOPSY RECTAL     CESAREAN SECTION  1990/1993   x2   COLONOSCOPY WITH PROPOFOL  N/A 06/25/2018   Procedure: COLONOSCOPY WITH PROPOFOL ;  Surgeon: Kung Ruel, MD;  Location: [facility] ENDOSCOPY;  Service: Gastroenterology;  Laterality: N/A;   ESOPHAGOGASTRODUODENOSCOPY (EGD) WITH PROPOFOL  N/A 06/25/2018   Procedure: ESOPHAGOGASTRODUODENOSCOPY (EGD) WITH PROPOFOL ;  Surgeon: Kung Ruel, MD;  Location: [facility] ENDOSCOPY;  Service: Gastroenterology;  Laterality: N/A;   EUS N/A 05/17/2015   Procedure: LOWER ENDOSCOPIC ULTRASOUND (EUS);  Surgeon: Asberry DELENA Coffee, MD;  Location: [facility] ENDOSCOPY;  Service: Gastroenterology;  Laterality: N/A;   JOINT REPLACEMENT Left 06/2017   OVARIAN CYST SURGERY  1993   partial knee replacemt Left 06/24/2017    Prior to Admission medications   Medication Sig Start Date End Date Taking? Authorizing Provider  ALPRAZolam  (XANAX ) 0.5 MG tablet Take 1 tablet (0.5 mg total) by mouth 2 (two) times daily as needed for anxiety. 07/03/23  Yes Emilio Kelly DASEN, FNP  APPLE CIDER VINEGAR PO Take by mouth. gummies   Yes [provider]  cephALEXin  (KEFLEX ) 250 MG capsule Take 1 capsule (250 mg total) by mouth daily as needed. Patient not taking: Reported  on 07/03/2023 11/27/22   Emilio Kelly DASEN, FNP  cetirizine  (ZYRTEC ) 10 MG tablet Take 1 tablet (10 mg total) by mouth daily. 06/28/21  Yes Emilio Kelly T, FNP  clobetasol  (TEMOVATE ) 0.05 % external solution APPLY  TOPICALLY TO AFFECTED AREA  OF SCALP ONCE TO TWICE DAILY AVOID  FACE, groin, axilla 07/06/23  Yes Jackquline Sawyer, MD  cyclobenzaprine  (FLEXERIL ) 10 MG tablet Take 1 tablet (10 mg total) by mouth 3 (three) times daily as needed. 08/03/23  Yes Simmons-Robinson, Makiera, MD  diclofenac  Sodium (VOLTAREN ) 1 % GEL Apply 4 g topically 4 (four) times daily. 06/28/21  Yes Emilio Kelly DASEN, FNP  fluocinolone  (VANOS) 0.01 % cream Apply to affected areas face/frontal hairline once to twice daily as needed for psoriasis. 07/01/22  Yes Emilio Kelly DASEN, FNP  fluticasone  (FLONASE ) 50 MCG/ACT nasal spray Use 2 spray(s) in each nostril once daily 07/01/22  Yes Emilio Kelly T, FNP  gabapentin  (NEURONTIN ) 100 MG capsule Take 100-300 mg (1-3 capsules) as needed nightly for chronic pain. 07/10/23  Yes Emilio Kelly DASEN, FNP  Lactobacillus-Inulin (PROBIOTIC DIGESTIVE SUPPORT PO) Take by mouth. Raw probiotic Probiotic and preprobiotic   Yes [provider]  levothyroxine  (SYNTHROID ) 100 MCG tablet Take 1 tablet (100 mcg total) by mouth daily. 07/24/23  Yes Simmons-Robinson, Makiera, MD  MAGNESIUM  PO Take 400 mg by mouth at bedtime.   Yes [provider]  Multiple Vitamins-Minerals (ZINC PO) Take 50 mg by mouth daily.   Yes [provider]  NON FORMULARY CBD oil   Yes  [provider]  predniSONE  (DELTASONE ) 10 MG tablet Take 1 tablet (10 mg total) by mouth daily with breakfast. 03/31/23  Yes Emilio Kelly DASEN, FNP  Psyllium (EQ DAILY FIBER) 400 MG CAPS Take by mouth.   Yes [provider]  rosuvastatin  (CRESTOR ) 10 MG tablet Take 1 tablet (10 mg total) by mouth daily. 03/31/23  Yes Emilio Kelly DASEN, FNP  sertraline  (ZOLOFT ) 100 MG tablet Take 1 tablet (100 mg total) by mouth daily.  03/31/23  Yes Emilio Kelly DASEN, FNP  NON FORMULARY CPAP (Device) nightly    [provider]    Allergies as of 07/21/2023 - Review Complete 07/03/2023  Allergen Reaction Noted   Augmentin  [amoxicillin -pot clavulanate] Rash 09/20/2018   Nitrofurantoin monohyd macro Rash 05/15/2015    Family History  Problem Relation Age of Onset   Breast cancer Paternal Grandmother    Cancer Paternal Grandmother        Breast Cancer   Hyperlipidemia Mother    Hypertension Mother    Hypertension Sister    Arthritis Maternal Grandfather     Social History   Socioeconomic History   Marital status: Married    Spouse name: Not on file   Number of children: 2   Years of education: Not on file   Highest education level: Associate degree: occupational, scientist, product/process development, or vocational program  Occupational History    Employer: [employer]  Tobacco Use   Smoking status: Never   Smokeless tobacco: Never  Vaping Use   Vaping status: Never Used  Substance and Sexual Activity   Alcohol use: Yes    Alcohol/week: 0.0 - 1.0 standard drinks of alcohol    Comment: Occasional   Drug use: No   Sexual activity: Not on file  Other Topics Concern   Not on file  Social History Narrative   Not on file   Social Drivers of Health   Financial Resource Strain: Low Risk  (07/02/2023)   Overall Financial Resource Strain (CARDIA)    Difficulty of Paying Living Expenses: Not hard at all  Food Insecurity: No Food Insecurity (07/02/2023)   Hunger Vital Sign    Worried About Running Out of Food in the Last Year: Never true    Ran Out of Food in the Last Year: Never true  Transportation Needs: No Transportation Needs (07/02/2023)   PRAPARE - Administrator, Civil Service (Medical): No    Lack of Transportation (Non-Medical): No  Physical Activity: Insufficiently Active (07/02/2023)   Exercise Vital Sign    Days of Exercise per Week: 2 days    Minutes of Exercise per Session: 10 min   Stress: Stress Concern Present (07/02/2023)   [employer] of Occupational Health - Occupational Stress Questionnaire    Feeling of Stress : To some extent  Social Connections: Unknown (07/02/2023)   Social Connection and Isolation Panel [NHANES]    Frequency of Communication with Friends and Family: More than three times a week    Frequency of Social Gatherings with Friends and Family: More than three times a week    Attends Religious Services: More than 4 times per year    Active Member of [employer] or Organizations: Patient declined    Attends Engineer, Structural: Not on file    Marital Status: Married  Catering Manager Violence: Not on file    Review of Systems: See HPI, otherwise negative ROS  Physical Exam: BP 134/75   Pulse 95   Temp (!) 97 F (  36.1 C) (Temporal)   Resp 16   Ht 5' 2 (1.575 m)   Wt 71.7 kg   LMP 06/20/2018 Comment: no urine pregnancy needed per Dr. Stevan  SpO2 98%   BMI 28.90 kg/m  General:   Alert,  pleasant and cooperative in NAD Head:  Normocephalic and atraumatic. Neck:  Supple; no masses or thyromegaly. Lungs:  Clear throughout to auscultation, normal respiratory effort.    Heart:  +S1, +S2, Regular rate and rhythm, No edema. Abdomen:  Soft, nontender and nondistended. Normal bowel sounds, without guarding, and without rebound.   Neurologic:  Alert and  oriented x4;  grossly normal neurologically.  Impression/Plan: Jordan Baker is here for an colonoscopy to be performed for surveillance due to prior history of colon polyps   Risks, benefits, limitations, and alternatives regarding  colonoscopy have been reviewed with the patient.  Questions have been answered.  All parties agreeable.   Ruel Kung, MD  08/21/2023, 8:30 AM

## 2023-08-24 ENCOUNTER — Ambulatory Visit
Admission: RE | Admit: 2023-08-24 | Discharge: 2023-08-24 | Disposition: A | Discharge status: Home / Self Care | Payer: Managed Care, Other (non HMO) | Source: Ambulatory Visit | Attending: Family Medicine

## 2023-08-24 ENCOUNTER — Ambulatory Visit
Admission: RE | Admit: 2023-08-24 | Discharge: 2023-08-24 | Disposition: A | Discharge status: Home / Self Care | Payer: Managed Care, Other (non HMO) | Source: Ambulatory Visit | Attending: Family Medicine | Admitting: Family Medicine

## 2023-08-24 ENCOUNTER — Encounter: Payer: Self-pay | Admitting: Gastroenterology

## 2023-08-24 DIAGNOSIS — R928 Other abnormal and inconclusive findings on diagnostic imaging of breast: Secondary | ICD-10-CM

## 2023-11-04 ENCOUNTER — Encounter: Payer: BLUE CROSS/BLUE SHIELD | Attending: Internal Medicine | Primary: Internal Medicine

## 2024-01-10 DIAGNOSIS — M1711 Unilateral primary osteoarthritis, right knee: Principal | ICD-10-CM | POA: Insufficient documentation

## 2024-01-10 DIAGNOSIS — J300 Vasomotor rhinitis: Secondary | ICD-10-CM | POA: Insufficient documentation

## 2024-01-12 ENCOUNTER — Ambulatory Visit: Payer: Managed Care, Other (non HMO) | Admitting: Dermatology

## 2024-01-18 ENCOUNTER — Ambulatory Visit: Payer: Self-pay | Admitting: Family Medicine

## 2024-01-25 ENCOUNTER — Ambulatory Visit (INDEPENDENT_AMBULATORY_CARE_PROVIDER_SITE_OTHER): Admitting: Family Medicine

## 2024-01-25 ENCOUNTER — Encounter: Payer: Self-pay | Admitting: Family Medicine

## 2024-01-25 VITALS — BP 132/82 | HR 119 | Ht 62.0 in | Wt 166.0 lb

## 2024-01-25 DIAGNOSIS — M350C Sjogren syndrome with dental involvement: Secondary | ICD-10-CM

## 2024-01-25 DIAGNOSIS — E7800 Pure hypercholesterolemia, unspecified: Secondary | ICD-10-CM

## 2024-01-25 DIAGNOSIS — L409 Psoriasis, unspecified: Secondary | ICD-10-CM

## 2024-01-25 DIAGNOSIS — I10 Essential (primary) hypertension: Secondary | ICD-10-CM

## 2024-01-25 DIAGNOSIS — E034 Atrophy of thyroid (acquired): Secondary | ICD-10-CM | POA: Diagnosis not present

## 2024-01-25 DIAGNOSIS — F4001 Agoraphobia with panic disorder: Secondary | ICD-10-CM

## 2024-01-25 DIAGNOSIS — L309 Dermatitis, unspecified: Secondary | ICD-10-CM

## 2024-01-25 DIAGNOSIS — Z79899 Other long term (current) drug therapy: Secondary | ICD-10-CM

## 2024-01-25 DIAGNOSIS — M62830 Muscle spasm of back: Secondary | ICD-10-CM

## 2024-01-25 DIAGNOSIS — E7849 Other hyperlipidemia: Secondary | ICD-10-CM

## 2024-01-25 DIAGNOSIS — R7303 Prediabetes: Secondary | ICD-10-CM

## 2024-01-25 DIAGNOSIS — G2581 Restless legs syndrome: Secondary | ICD-10-CM

## 2024-01-25 DIAGNOSIS — G1119 Other early-onset cerebellar ataxia: Secondary | ICD-10-CM

## 2024-01-25 MED ORDER — CYCLOBENZAPRINE HCL 10 MG PO TABS: 10.0000 mg | ORAL_TABLET | Freq: Three times a day (TID) | ORAL | 1 refills | Status: DC | PRN

## 2024-01-25 MED ORDER — VALACYCLOVIR HCL 500 MG PO TABS
500.0000 mg | ORAL_TABLET | Freq: Three times a day (TID) | ORAL | 1 refills | Status: AC
Start: 2024-01-25 — End: ?

## 2024-01-25 MED ORDER — GABAPENTIN 100 MG PO CAPS: ORAL_CAPSULE | ORAL | 2 refills | Status: DC

## 2024-01-25 MED ORDER — LEVOTHYROXINE SODIUM 100 MCG PO TABS: 100.0000 ug | ORAL_TABLET | Freq: Every day | ORAL | 2 refills | Status: AC

## 2024-01-25 MED ORDER — ROSUVASTATIN CALCIUM 10 MG PO TABS: 10.0000 mg | ORAL_TABLET | Freq: Every day | ORAL | 3 refills | Status: AC

## 2024-01-25 MED ORDER — CLOBETASOL PROPIONATE 0.05 % EX SOLN: CUTANEOUS | 2 refills | Status: AC

## 2024-01-25 MED ORDER — FLUOCINOLONE ACETONIDE 0.01 % EX CREA: TOPICAL_CREAM | CUTANEOUS | 2 refills | Status: AC

## 2024-01-25 MED ORDER — CEPHALEXIN 250 MG PO CAPS
250.0000 mg | ORAL_CAPSULE | Freq: Every day | ORAL | 2 refills | Status: AC | PRN
Start: 2024-01-25 — End: ?

## 2024-01-25 MED ORDER — ALPRAZOLAM 0.5 MG PO TABS: 0.5000 mg | ORAL_TABLET | Freq: Two times a day (BID) | ORAL | 0 refills | Status: AC | PRN

## 2024-01-25 MED ORDER — PREDNISONE 10 MG PO TABS: 10.0000 mg | ORAL_TABLET | Freq: Every day | ORAL | 0 refills | Status: AC

## 2024-01-25 MED ORDER — ROPINIROLE HCL 0.5 MG PO TABS: 0.5000 mg | ORAL_TABLET | Freq: Every day | ORAL | 1 refills | Status: DC

## 2024-01-25 MED ORDER — SERTRALINE HCL 100 MG PO TABS
100.0000 mg | ORAL_TABLET | Freq: Every day | ORAL | 3 refills | Status: AC
Start: 2024-01-25 — End: ?

## 2024-01-25 NOTE — Progress Notes (Signed)
 Established patient visit   Patient: Jordan Baker   DOB: Feb 09, 1961   63 y.o. Female  MRN: [id number] Visit Date: 01/25/2024  Today's healthcare provider: Rockie Agent, MD   Chief Complaint  Patient presents with   Transitions Of Care    Needs meds refilled and discuss possible referral rhem   Subjective     HPI     Transitions Of Care    Additional comments: Needs meds refilled and discuss possible referral rhem      Last edited by Thelbert Eulalio HERO, CMA on 01/25/2024  1:45 PM.       Discussed the use of AI scribe software for clinical note transcription with the patient, who gave verbal consent to proceed.  History of Present Illness Jordan Baker is a 63 year old female with Sjogren's syndrome who presents for chronic follow-up.  She has a history of Sjogren's syndrome and has recently experienced swelling in the glands in front of her ears and redness in her earlobes. Flare-ups sometimes cause her right eye to almost close. She uses prednisone  during these flare-ups and is seeking further evaluation from a rheumatologist.  She has hypothyroidism and is currently taking Synthroid  100 micrograms daily. She requests a 90-day supply as she has been receiving 30-day supplies since her last visit in December.  She has hypercholesterolemia and is taking Crestor  10 mg daily. She has a history of familial hyperlipidemia, with cholesterol levels previously reaching 240 mg/dL. A CT scan last year showed no blockages.  She experiences restless leg syndrome two to three times a week, which causes her to wake up and get out of bed until the symptoms subside. Gabapentin , which she takes for chronic pain, has not alleviated the symptoms. She also takes magnesium supplements to manage symptoms.  She has a history of panic disorder and takes sertraline  50-100 mg daily depending on her symptoms. She uses Xanax  0.5 mg twice a day as needed, particularly at night  when her restless leg syndrome worsens.  She has psoriasis affecting her scalp and face, for which she uses clobetasol  solution and Vanos cream. Stress exacerbates her symptoms.  She uses Flonase  nasal spray as needed for allergic rhinitis.  She has a history of Ramsay Hunt syndrome, which has caused her right eye to close during flare-ups. She uses Valtrex  as needed to manage symptoms.  She takes apple cider vinegar instead of omeprazole  for gastrointestinal issues, as omeprazole  exacerbated her symptoms.     Past Medical History:  Diagnosis Date   Actinic keratosis 10/26/2009   Right upper arm.   Anxiety    Basal cell carcinoma 07/12/2013   Right nasal dorsum. Nodular pattern.   Dysplastic nevus 11/05/2007   Left thigh. Moderate atypia. Close to surgical margin.   GERD (gastroesophageal reflux disease)    Hyperlipidemia    Hypothyroidism     Medications: Outpatient Medications Prior to Visit  Medication Sig   cephALEXin  (KEFLEX ) 250 MG capsule Take 1 capsule (250 mg total) by mouth daily as needed.   valACYclovir  (VALTREX ) 500 MG tablet Take 500 mg by mouth 3 (three) times daily.   ALPRAZolam  (XANAX ) 0.5 MG tablet Take 1 tablet (0.5 mg total) by mouth 2 (two) times daily as needed for anxiety.   APPLE CIDER VINEGAR PO Take by mouth. gummies   cetirizine  (ZYRTEC ) 10 MG tablet Take 1 tablet (10 mg total) by mouth daily.   clobetasol  (TEMOVATE ) 0.05 % external solution APPLY  TOPICALLY TO  AFFECTED AREA  OF SCALP ONCE TO TWICE DAILY AVOID  FACE, groin, axilla   cyclobenzaprine  (FLEXERIL ) 10 MG tablet Take 1 tablet (10 mg total) by mouth 3 (three) times daily as needed.   diclofenac  Sodium (VOLTAREN ) 1 % GEL Apply 4 g topically 4 (four) times daily.   fluocinolone  (VANOS) 0.01 % cream Apply to affected areas face/frontal hairline once to twice daily as needed for psoriasis.   fluticasone  (FLONASE ) 50 MCG/ACT nasal spray Use 2 spray(s) in each nostril once daily   gabapentin   (NEURONTIN ) 100 MG capsule Take 100-300 mg (1-3 capsules) as needed nightly for chronic pain.   Lactobacillus-Inulin (PROBIOTIC DIGESTIVE SUPPORT PO) Take by mouth. Raw probiotic Probiotic and preprobiotic   levothyroxine  (SYNTHROID ) 100 MCG tablet Take 1 tablet (100 mcg total) by mouth daily.   MAGNESIUM PO Take 400 mg by mouth at bedtime.   Multiple Vitamins-Minerals (ZINC PO) Take 50 mg by mouth daily.   NON FORMULARY CPAP (Device) nightly   NON FORMULARY CBD oil   predniSONE  (DELTASONE ) 10 MG tablet Take 1 tablet (10 mg total) by mouth daily with breakfast.   Psyllium (EQ DAILY FIBER) 400 MG CAPS Take by mouth.   rosuvastatin  (CRESTOR ) 10 MG tablet Take 1 tablet (10 mg total) by mouth daily.   sertraline  (ZOLOFT ) 100 MG tablet Take 1 tablet (100 mg total) by mouth daily.   No facility-administered medications prior to visit.    Review of Systems  Last metabolic panel Lab Results  Component Value Date   GLUCOSE 91 06/26/2023   NA 140 06/26/2023   K 4.2 06/26/2023   CL 101 06/26/2023   CO2 24 06/26/2023   BUN 13 06/26/2023   CREATININE 0.80 06/26/2023   EGFR 84 06/26/2023   CALCIUM  9.5 06/26/2023   PROT 6.5 06/26/2023   ALBUMIN 4.4 06/26/2023   LABGLOB 2.1 06/26/2023   AGRATIO 2.0 06/17/2022   BILITOT 0.3 06/26/2023   ALKPHOS 97 06/26/2023   AST 17 06/26/2023   ALT 17 06/26/2023   Last lipids Lab Results  Component Value Date   CHOL 191 06/26/2023   HDL 58 06/26/2023   LDLCALC 107 (H) 06/26/2023   TRIG 146 06/26/2023   CHOLHDL 3.3 06/26/2023   The 10-year ASCVD risk score (Arnett DK, et al., 2019) is: 3.6%   Last hemoglobin A1c Lab Results  Component Value Date   HGBA1C 5.8 (H) 06/26/2023   Last thyroid  functions Lab Results  Component Value Date   TSH 0.522 06/26/2023   T4TOTAL 9.1 05/22/2020        Objective    BP 132/82   Pulse (!) 119   Ht 5' 2 (1.575 m)   Wt 166 lb (75.3 kg)   LMP 06/20/2018 Comment: no urine pregnancy needed per Dr.  Stevan  SpO2 97%   BMI 30.36 kg/m  BP Readings from Last 3 Encounters:  01/25/24 132/82  08/21/23 129/78  07/03/23 120/70   Wt Readings from Last 3 Encounters:  01/25/24 166 lb (75.3 kg)  08/21/23 158 lb (71.7 kg)  07/03/23 163 lb (73.9 kg)        Physical Exam  Physical Exam HEENT: No erythema or rash around right ear. CHEST: Lungs clear to auscultation bilaterally. CARDIOVASCULAR: Regular rate and rhythm. NEUROLOGICAL: Cranial nerves grossly intact. Symmetric facial expressions.    No results found for any visits on 01/25/24.  Assessment & Plan     Problem List Items Addressed This Visit       Cardiovascular and  Mediastinum   Primary hypertension - Primary     Digestive   Sjogren syndrome with dental involvement (HCC)     Endocrine   Hypothyroidism     Other   Prediabetes   Panic disorder with agoraphobia and moderate panic attacks   Hypercholesterolemia   Familial hyperlipidemia    Assessment & Plan Restless Leg Syndrome Experiences restless leg syndrome two to three times a week, severe enough to disrupt sleep. Gabapentin  has not alleviated symptoms. Crestor  may cause achy legs but not restless legs. Restless leg syndrome can sometimes be associated with low iron or vitamin D  levels, although these were not specifically tested during this visit. Requip  is recommended to address symptoms. - Prescribe Requip  0.5 mg at bedtime. - Monitor and adjust treatment as necessary.  Panic Disorder Has panic disorder and is currently taking sertraline  and Xanax  as needed. Experiences anxiety, particularly at night, and uses Xanax  to manage these symptoms. Hopes that addressing the restless leg syndrome will reduce need for Xanax . - Continue sertraline  100 mg daily, adjusting to 50 mg on good days. - Continue Xanax  0.5 mg twice a day as needed, with a plan to taper if Requip  helps with restless leg syndrome.  Sjogren's Syndrome Sjogren's syndrome causing  swelling in glands near ears and redness in earlobes. Experiences flare-ups that sometimes cause eye to almost close. Uses prednisone  during flare-ups and has been prescribed Valtrex  by eye doctor to manage symptoms potentially related to herpes virus. Referral to rheumatology is needed for further management. - Refer to rheumatology for further management. - Prescribe prednisone  for use during flare-ups. - Continue Valtrex  as needed for flare-ups.  Hypothyroidism On Synthroid  100 mcg daily. Requested a 90-day supply instead of a 30-day supply for convenience. - Prescribe Synthroid  100 mcg with a 90-day supply. - Check thyroid  levels during the next visit in December.  Hyperlipidemia Familial hyperlipidemia, currently on Crestor  10 mg daily. Cholesterol levels have improved since starting Crestor , and ASCVD score was low during last check in December. Concerns about necessity of continuing cholesterol medication given previous CT scan showed no blockages. Crestor  has been effective in lowering cholesterol levels. - Continue Crestor  10 mg daily. - Reassess cholesterol levels during the next physical examination in December.  Psoriasis Psoriasis affecting scalp and face, managed with clobetasol  solution and Vanos cream. - Continue clobetasol  solution for scalp psoriasis. - Continue Vanos cream for facial psoriasis.     No follow-ups on file.      Total time spent on today's visit was 45 minutes, including both face-to-face time interviewing and examining the patient, reviewing medical record including labs/imaging/specialist notes, developing and discussing further evaluation,answering patient's questions, ordering referral to specialist, coordinating follow up care in addition to documenting in the patient's chart.     Rockie Agent, MD  [facility] [phone] (phone) [phone] (fax)  [facility]

## 2024-01-26 ENCOUNTER — Ambulatory Visit: Payer: Self-pay | Admitting: Family Medicine

## 2024-01-26 LAB — TSH+T4F+T3FREE
Free T4: 1.36 ng/dL (ref 0.82–1.77)
T3, Free: 2.7 pg/mL (ref 2.0–4.4)
TSH: 0.895 u[IU]/mL (ref 0.450–4.500)

## 2024-02-16 ENCOUNTER — Ambulatory Visit: Admit: 2024-02-16 | Discharge: 2024-02-16 | Payer: BLUE CROSS/BLUE SHIELD | Primary: Internal Medicine

## 2024-02-16 DIAGNOSIS — H6122 Impacted cerumen, left ear: Principal | ICD-10-CM

## 2024-02-16 NOTE — Progress Notes (Signed)
 Patient presented in office today for left ear fullness.  Ear lavage completed on left ear. Impacted cerumen removed from left ear successfully.

## 2024-03-08 ENCOUNTER — Ambulatory Visit (INDEPENDENT_AMBULATORY_CARE_PROVIDER_SITE_OTHER)

## 2024-03-08 DIAGNOSIS — C4490 Unspecified malignant neoplasm of skin, unspecified: Secondary | ICD-10-CM

## 2024-03-08 DIAGNOSIS — L578 Other skin changes due to chronic exposure to nonionizing radiation: Secondary | ICD-10-CM

## 2024-03-08 DIAGNOSIS — Z1283 Encounter for screening for malignant neoplasm of skin: Secondary | ICD-10-CM

## 2024-03-08 DIAGNOSIS — I781 Nevus, non-neoplastic: Secondary | ICD-10-CM

## 2024-03-08 DIAGNOSIS — D1801 Hemangioma of skin and subcutaneous tissue: Secondary | ICD-10-CM

## 2024-03-08 DIAGNOSIS — L814 Other melanin hyperpigmentation: Secondary | ICD-10-CM

## 2024-03-08 DIAGNOSIS — W908XXA Exposure to other nonionizing radiation, initial encounter: Secondary | ICD-10-CM

## 2024-03-08 DIAGNOSIS — L821 Other seborrheic keratosis: Secondary | ICD-10-CM

## 2024-03-08 DIAGNOSIS — D229 Melanocytic nevi, unspecified: Secondary | ICD-10-CM

## 2024-03-08 DIAGNOSIS — D489 Neoplasm of uncertain behavior, unspecified: Secondary | ICD-10-CM | POA: Diagnosis not present

## 2024-03-08 DIAGNOSIS — L439 Lichen planus, unspecified: Secondary | ICD-10-CM | POA: Diagnosis not present

## 2024-03-08 NOTE — Progress Notes (Signed)
 Follow-Up Visit   Subjective  Jordan Baker is a 63 y.o. female who presents for the following: Skin Cancer Screening and Full Body Skin Exam; hx of BCC on face. Patient has spots of concern on her chest and face.   The patient presents for Total-Body Skin Exam (TBSE) for skin cancer screening and mole check. The patient has spots, moles and lesions to be evaluated, some may be new or changing and the patient may have concern these could be cancer.    The following portions of the chart were reviewed this encounter and updated as appropriate: medications, allergies, medical history  Review of Systems:  No other skin or systemic complaints except as noted in HPI or Assessment and Plan.  Objective  Well appearing patient in no apparent distress; mood and affect are within normal limits.  A full examination was performed including scalp, head, eyes, ears, nose, lips, neck, chest, axillae, abdomen, back, buttocks, bilateral upper extremities, bilateral lower extremities, hands, feet, fingers, toes, fingernails, and toenails. All findings within normal limits unless otherwise noted below.   - 6-20 mm pigmented macules that are tan to brown in color and are somewhat non-uniform in shape and concentrated in the sun-exposed areas - Multiple stuck-on brown, tan and grey papillated papules and plaques  Nevi - well demarcated brown macules  - Cherry-red vascular papule(s) on trunk    Relevant physical exam findings are noted in the Assessment and Plan.  Chest - Medial (Center) 1cm pink scaly plaque  R mid abdomen 5mm red papule   Assessment & Plan   SKIN CANCER SCREENING PERFORMED TODAY.  ACTINIC DAMAGE - Chronic condition, secondary to cumulative UV/sun exposure - Recommend daily broad spectrum sunscreen SPF 30+ to sun-exposed areas, reapply every 2 hours as needed.  - Staying in the shade or wearing long sleeves, sun glasses (UVA+UVB protection) and wide brim hats (4-inch brim  around the entire circumference of the hat) are also recommended for sun protection.  - Call for new or changing lesions.  LENTIGINES, SEBORRHEIC KERATOSES, HEMANGIOMAS - Benign normal skin lesions - Benign-appearing - Call for any changes  MELANOCYTIC NEVI - Tan-brown and/or pink-flesh-colored symmetric macules and papules - Benign appearing on exam today - Observation - Call clinic for new or changing moles - Recommend daily use of broad spectrum spf 30+ sunscreen to sun-exposed areas.   Telangiectasis and telangiectasia of face Discussed condition, chronicity, benign nature of spots. For cosmesis, best approach is vascular laser. Considered cosmetic service.  Cherry angiomas - Reassured patient that these are benign lesions, not requiring biopsy. - Explained that more lesions may appear over time   NEOPLASM OF UNCERTAIN BEHAVIOR (2) Chest - Medial (Center) Skin / nail biopsy Type of biopsy: tangential   Informed consent: discussed and consent obtained   Patient was prepped and draped in usual sterile fashion: Area prepped with alcohol. Anesthesia: the lesion was anesthetized in a standard fashion   Anesthetic:  1% lidocaine  w/ epinephrine 1-100,000 buffered w/ 8.4% NaHCO3 Instrument used: flexible razor blade   Hemostasis achieved with: pressure, aluminum chloride and electrodesiccation   Outcome: patient tolerated procedure well   Post-procedure details: wound care instructions given   Post-procedure details comment:  Ointment and small bandage applied  Specimen 1 - Surgical pathology Differential Diagnosis: r/o SK vs SCC vs AK  Check Margins: No R mid abdomen Epidermal / dermal shaving  Lesion diameter (cm):  0.5 Informed consent: discussed and consent obtained   Timeout: patient name, date  of birth, surgical site, and procedure verified   Patient was prepped and draped in usual sterile fashion: area prepped with alcohol. Anesthesia: the lesion was anesthetized in  a standard fashion   Anesthetic:  1% lidocaine  w/ epinephrine 1-100,000 local infiltration Instrument used: flexible razor blade   Hemostasis achieved with: pressure, aluminum chloride and electrodesiccation   Outcome: patient tolerated procedure well   Post-procedure details: wound care instructions given   Post-procedure details comment:  Ointment and a small bandage applied  Specimen 2 - Surgical pathology Differential Diagnosis: Angioma vs other r/o dysplasia    Check Margins: No Return in about 1 year (around 03/08/2025) for TBSE.  I, Emerick Ege, CMA am acting as scribe for Lauraine JAYSON Kanaris, MD.   Documentation: I have reviewed the above documentation for accuracy and completeness, and I agree with the above.  Lauraine JAYSON Kanaris, MD

## 2024-03-08 NOTE — Patient Instructions (Addendum)
 Wound Care Instructions  Cleanse wound gently with soap and water once a day then pat dry with clean gauze. Apply a thin coat of Petrolatum (petroleum jelly, Vaseline) over the wound (unless you have an allergy to this). We recommend that you use a new, sterile tube of Vaseline. Do not pick or remove scabs. Do not remove the yellow or white healing tissue from the base of the wound.  Cover the wound with fresh, clean, nonstick gauze and secure with paper tape. You may use Band-Aids in place of gauze and tape if the wound is small enough, but would recommend trimming much of the tape off as there is often too much. Sometimes Band-Aids can irritate the skin.  You should call the office for your biopsy report after 1 week if you have not already been contacted.  If you experience any problems, such as abnormal amounts of bleeding, swelling, significant bruising, significant pain, or evidence of infection, please call the office immediately.  FOR ADULT SURGERY PATIENTS: If you need something for pain relief you may take 1 extra strength Tylenol  (acetaminophen ) AND 2 Ibuprofen  (200mg  each) together every 4 hours as needed for pain. (do not take these if you are allergic to them or if you have a reason you should not take them.) Typically, you may only need pain medication for 1 to 3 days.      Recommend daily broad spectrum sunscreen SPF 30+ to sun-exposed areas, reapply every 2 hours as needed. Call for new or changing lesions.  Staying in the shade or wearing long sleeves, sun glasses (UVA+UVB protection) and wide brim hats (4-inch brim around the entire circumference of the hat) are also recommended for sun protection.    Melanoma ABCDEs  Melanoma is the most dangerous type of skin cancer, and is the leading cause of death from skin disease.  You are more likely to develop melanoma if you: Have light-colored skin, light-colored eyes, or red or blond hair Spend a lot of time in the sun Tan  regularly, either outdoors or in a tanning bed Have had blistering sunburns, especially during childhood Have a close family member who has had a melanoma Have atypical moles or large birthmarks  Early detection of melanoma is key since treatment is typically straightforward and cure rates are extremely high if we catch it early.   The first sign of melanoma is often a change in a mole or a new dark spot.  The ABCDE system is a way of remembering the signs of melanoma.  A for asymmetry:  The two halves do not match. B for border:  The edges of the growth are irregular. C for color:  A mixture of colors are present instead of an even brown color. D for diameter:  Melanomas are usually (but not always) greater than 6mm - the size of a pencil eraser. E for evolution:  The spot keeps changing in size, shape, and color.  Please check your skin once per month between visits. You can use a small mirror in front and a large mirror behind you to keep an eye on the back side or your body.   If you see any new or changing lesions before your next follow-up, please call to schedule a visit.  Please continue daily skin protection including broad spectrum sunscreen SPF 30+ to sun-exposed areas, reapplying every 2 hours as needed when you're outdoors.    Due to recent changes in healthcare laws, you may see results of your  pathology and/or laboratory studies on MyChart before the doctors have had a chance to review them. We understand that in some cases there may be results that are confusing or concerning to you. Please understand that not all results are received at the same time and often the doctors may need to interpret multiple results in order to provide you with the best plan of care or course of treatment. Therefore, we ask that you please give us  2 business days to thoroughly review all your results before contacting the office for clarification. Should we see a critical lab result, you will be  contacted sooner.   If You Need Anything After Your Visit  If you have any questions or concerns for your doctor, please call our main line at [phone] and press option 4 to reach your doctor's medical assistant. If no one answers, please leave a voicemail as directed and we will return your call as soon as possible. Messages left after 4 pm will be answered the following business day.   You may also send us  a message via MyChart. We typically respond to MyChart messages within 1-2 business days.  For prescription refills, please ask your pharmacy to contact our office. Our fax number is [phone].  If you have an urgent issue when the clinic is closed that cannot wait until the next business day, you can page your doctor at the number below.    Please note that while we do our best to be available for urgent issues outside of office hours, we are not available 24/7.   If you have an urgent issue and are unable to reach us , you may choose to seek medical care at your doctor's office, retail clinic, urgent care center, or emergency room.  If you have a medical emergency, please immediately call 911 or go to the emergency department.  Pager Numbers  - Dr. Hester: [phone]  - Dr. Jackquline: [phone]  - Dr. Claudene: [phone]   - Dr. Raymund: [phone]  In the event of inclement weather, please call our main line at [phone] for an update on the status of any delays or closures.  Dermatology Medication Tips: Please keep the boxes that topical medications come in in order to help keep track of the instructions about where and how to use these. Pharmacies typically print the medication instructions only on the boxes and not directly on the medication tubes.   If your medication is too expensive, please contact our office at [phone] option 4 or send us  a message through MyChart.   We are unable to tell what your co-pay for medications will be in advance as this  is different depending on your insurance coverage. However, we may be able to find a substitute medication at lower cost or fill out paperwork to get insurance to cover a needed medication.   If a prior authorization is required to get your medication covered by your insurance company, please allow us  1-2 business days to complete this process.  Drug prices often vary depending on where the prescription is filled and some pharmacies may offer cheaper prices.  The website www.goodrx.com contains coupons for medications through different pharmacies. The prices here do not account for what the cost may be with help from insurance (it may be cheaper with your insurance), but the website can give you the price if you did not use any insurance.  - You can print the associated coupon and take it with your prescription to the pharmacy.  -  You may also stop by our office during regular business hours and pick up a GoodRx coupon card.  - If you need your prescription sent electronically to a different pharmacy, notify our office through [facility] or by phone at [phone] option 4.     Si Usted Necesita Algo Despus de Su Visita  Tambin puede enviarnos un mensaje a travs de Clinical cytogeneticist. Por lo general respondemos a los mensajes de MyChart en el transcurso de 1 a 2 das hbiles.  Para renovar recetas, por favor pida a su farmacia que se ponga en contacto con nuestra oficina. Randi lakes de fax es [place] [phone].  Si tiene un asunto urgente cuando la clnica est cerrada y que no puede esperar hasta el siguiente da hbil, puede llamar/localizar a su doctor(a) al nmero que aparece a continuacin.   Por favor, tenga en cuenta que aunque hacemos todo lo posible para estar disponibles para asuntos urgentes fuera del horario de [place], no estamos disponibles las 24 horas del da, los 7 [address]  [address] de la [place].   Si tiene un problema urgente y no puede comunicarse con nosotros, puede optar por  buscar atencin mdica  en el consultorio de su doctor(a), en una clnica privada, en un centro de atencin urgente o en una sala de emergencias.  Si tiene Engineer, drilling, por favor llame inmediatamente al 911 o vaya a la sala de emergencias.  Nmeros de bper  - Dr. Hester: [phone]  - Dra. Jackquline: [phone]  - Dr. Claudene: [phone]  - Dra. Kitts: [phone]  En caso de inclemencias del [place], por favor llame a nuestra lnea principal al [phone] para una actualizacin sobre el estado de cualquier retraso o cierre.  Consejos para la medicacin en dermatologa: Por favor, guarde las cajas en las que vienen los medicamentos de uso tpico para ayudarle a seguir las instrucciones sobre dnde y cmo usarlos. Las farmacias generalmente imprimen las instrucciones del medicamento slo en las cajas y no directamente en los tubos del [place].   Si su medicamento es muy caro, por favor, pngase en contacto con landry rieger llamando al [phone] y presione la opcin 4 o envenos un mensaje a travs de Clinical cytogeneticist.   No podemos decirle cul ser su copago por los medicamentos por adelantado ya que esto es diferente dependiendo de la cobertura de su seguro. Sin embargo, es posible que podamos encontrar un medicamento sustituto a Audiological scientist un formulario para que el seguro cubra el medicamento que se considera necesario.   Si se requiere una autorizacin previa para que su compaa de seguros malta su medicamento, por favor permtanos de 1 a 2 das hbiles para completar este proceso.  Los precios de los medicamentos varan con frecuencia dependiendo del Environmental consultant de dnde se surte la receta y alguna farmacias pueden ofrecer precios ms baratos.  El sitio web www.goodrx.com tiene cupones para medicamentos de Health and safety inspector. Los precios aqu no tienen en cuenta lo que podra costar con la ayuda del seguro (puede ser ms barato con su seguro), pero el sitio web  puede darle el precio si no utiliz Tourist information centre manager.  - Puede imprimir el cupn correspondiente y llevarlo con su receta a la farmacia.  - Tambin puede pasar por nuestra oficina durante el horario de atencin regular y Education officer, museum una tarjeta de cupones de GoodRx.  - Si necesita que su receta se enve electrnicamente a Psychiatrist, informe a nuestra oficina a travs de MyChart de [employer] o por  telfono llamando al [phone] y presione la opcin 4.

## 2024-03-11 LAB — SURGICAL PATHOLOGY

## 2024-03-15 ENCOUNTER — Ambulatory Visit: Payer: Self-pay

## 2024-03-15 NOTE — Telephone Encounter (Signed)
 Advised patient of results.

## 2024-03-15 NOTE — Telephone Encounter (Signed)
-----   Message from Lauraine JAYSON Kanaris sent at 03/15/2024  8:44 AM EDT -----  1. Skin, chest - medial (center) :       LICHEN PLANUS-LIKE KERATOSIS        2. Skin, R mid abdomen :       HEMANGIOMA   Please notify patient of benign keratosis, hemangioma - no further tx.  ----- Message ----- From: Interface, Lab In [place] Sent: 03/11/2024   2:48 PM EDT To: Lauraine JAYSON Kanaris, MD

## 2024-04-25 NOTE — Discharge Instructions (Signed)

## 2024-04-29 ENCOUNTER — Other Ambulatory Visit

## 2024-05-03 ENCOUNTER — Encounter
Admission: RE | Admit: 2024-05-03 | Discharge: 2024-05-03 | Disposition: A | Discharge status: Home / Self Care | Source: Ambulatory Visit | Attending: Orthopedic Surgery | Admitting: Orthopedic Surgery

## 2024-05-03 ENCOUNTER — Other Ambulatory Visit: Payer: Self-pay

## 2024-05-03 ENCOUNTER — Other Ambulatory Visit

## 2024-05-03 VITALS — BP 152/81 | HR 115 | Resp 14 | Ht 62.0 in | Wt 172.0 lb

## 2024-05-03 DIAGNOSIS — M1711 Unilateral primary osteoarthritis, right knee: Secondary | ICD-10-CM

## 2024-05-03 DIAGNOSIS — Z01818 Encounter for other preprocedural examination: Secondary | ICD-10-CM | POA: Insufficient documentation

## 2024-05-03 DIAGNOSIS — M350C Sjogren syndrome with dental involvement: Secondary | ICD-10-CM | POA: Diagnosis not present

## 2024-05-03 DIAGNOSIS — R7303 Prediabetes: Secondary | ICD-10-CM | POA: Diagnosis not present

## 2024-05-03 DIAGNOSIS — Z880 Allergy status to penicillin: Secondary | ICD-10-CM | POA: Diagnosis not present

## 2024-05-03 DIAGNOSIS — L409 Psoriasis, unspecified: Secondary | ICD-10-CM | POA: Insufficient documentation

## 2024-05-03 HISTORY — DX: Restless legs syndrome: G25.81

## 2024-05-03 HISTORY — DX: Obstructive sleep apnea (adult) (pediatric): G47.33

## 2024-05-03 HISTORY — DX: Prediabetes: R73.03

## 2024-05-03 LAB — C-REACTIVE PROTEIN: CRP: 0.6 mg/dL (ref <1.0)

## 2024-05-03 LAB — CBC
HCT: 38.6 % (ref 36.0–46.0)
Hemoglobin: 13 g/dL (ref 12.0–15.0)
MCH: 30.3 pg (ref 26.0–34.0)
MCHC: 33.7 g/dL (ref 30.0–36.0)
MCV: 90 fL (ref 80.0–100.0)
Platelets: 287 K/uL (ref 150–400)
RBC: 4.29 MIL/uL (ref 3.87–5.11)
RDW: 12.5 % (ref 11.5–15.5)
WBC: 5.9 K/uL (ref 4.0–10.5)
nRBC: 0 % (ref 0.0–0.2)

## 2024-05-03 LAB — COMPREHENSIVE METABOLIC PANEL WITH GFR
ALT: 22 U/L (ref 0–44)
AST: 25 U/L (ref 15–41)
Albumin: 4.2 g/dL (ref 3.5–5.0)
Alkaline Phosphatase: 71 U/L (ref 38–126)
Anion gap: 9 (ref 5–15)
BUN: 17 mg/dL (ref 8–23)
CO2: 26 mmol/L (ref 22–32)
Calcium: 9.4 mg/dL (ref 8.9–10.3)
Chloride: 107 mmol/L (ref 98–111)
Creatinine, Ser: 0.64 mg/dL (ref 0.44–1.00)
GFR, Estimated: >60 mL/min (ref >60)
Glucose, Bld: 92 mg/dL (ref 70–99)
Potassium: 3.7 mmol/L (ref 3.5–5.1)
Sodium: 142 mmol/L (ref 135–145)
Total Bilirubin: 0.3 mg/dL (ref 0.0–1.2)
Total Protein: 7.8 g/dL (ref 6.5–8.1)

## 2024-05-03 LAB — SURGICAL PCR SCREEN
MRSA, PCR: NEGATIVE
Staphylococcus aureus: POSITIVE — AB

## 2024-05-03 LAB — URINALYSIS, ROUTINE W REFLEX MICROSCOPIC
Bilirubin Urine: NEGATIVE
Glucose, UA: NEGATIVE mg/dL
Hgb urine dipstick: NEGATIVE
Ketones, ur: NEGATIVE mg/dL
Leukocytes,Ua: NEGATIVE
Nitrite: NEGATIVE
Protein, ur: NEGATIVE mg/dL
Specific Gravity, Urine: 1.003 — ABNORMAL LOW (ref 1.005–1.030)
pH: 6 (ref 5.0–8.0)

## 2024-05-03 LAB — SEDIMENTATION RATE: Sed Rate: 15 mm/h (ref 0–30)

## 2024-05-03 LAB — HEMOGLOBIN A1C
Hgb A1c MFr Bld: 5.4 % (ref 4.8–5.6)
Mean Plasma Glucose: 108.28 mg/dL

## 2024-05-03 NOTE — Patient Instructions (Addendum)
 Your procedure is scheduled on: Monday 05/09/24 Report to the Registration Desk on the 1st floor of the [facility]. To find out your arrival time, please call [phone] between 1PM - 3PM on: Friday 05/06/24 If your arrival time is 6:00 am, do not arrive before that time as the [facility] entrance doors do not open until 6:00 am.  REMEMBER: Instructions that are not followed completely may result in serious medical risk, up to and including death; or upon the discretion of your surgeon and anesthesiologist your surgery may need to be rescheduled.  Do not eat food after midnight the night before surgery.  No gum chewing or hard candies.  You may however, drink CLEAR liquids up to 2 hours before you are scheduled to arrive for your surgery. Do not drink anything within 2 hours of your scheduled arrival time.  Clear liquids include: - water  - apple juice without pulp - gatorade (not RED colors) - black coffee or tea (Do NOT add milk or creamers to the coffee or tea) Do NOT drink anything that is not on this list.  **Type 1 and Type 2 diabetics should only drink water.**  In addition, your doctor has ordered for you to drink the provided:  Gatorade G2 Drinking this carbohydrate drink up to two hours before surgery helps to reduce insulin resistance and improve patient outcomes. Please complete drinking 2 hours before scheduled arrival time.  One week prior to surgery: Stop Anti-inflammatories (NSAIDS) such as Advil, Aleve, Ibuprofen, Motrin, Naproxen, Naprosyn and Aspirin based products such as Excedrin, Goody's Powder, BC Powder. Stop ANY OVER THE COUNTER supplements until after surgery.  You may however, continue to take Tylenol if needed for pain up until the day of surgery.  Continue taking all of your other prescription medications up until the day of surgery.  ON THE DAY OF SURGERY ONLY TAKE THESE MEDICATIONS WITH SIPS OF WATER:  levothyroxine  (SYNTHROID ) 100 MCG tablet   sertraline  (ZOLOFT ) 100 MG tablet   No Alcohol for 24 hours before or after surgery.  No Smoking including e-cigarettes for 24 hours before surgery.  No chewable tobacco products for at least 6 hours before surgery.  No nicotine patches on the day of surgery.  Do not use any recreational drugs for at least a week (preferably 2 weeks) before your surgery.  Please be advised that the combination of cocaine and anesthesia may have negative outcomes, up to and including death. If you test positive for cocaine, your surgery will be cancelled.  On the morning of surgery brush your teeth with toothpaste and water, you may rinse your mouth with mouthwash if you wish. Do not swallow any toothpaste or mouthwash.  Use CHG Soap or wipes as directed on instruction sheet.  Do not wear jewelry, make-up, hairpins, clips or nail polish.  For welded (permanent) jewelry: bracelets, anklets, waist bands, etc.  Please have this removed prior to surgery.  If it is not removed, there is a chance that hospital personnel will need to cut it off on the day of surgery.  Do not wear lotions, powders, or perfumes on the day of surgery   Do not shave body hair from the neck down 48 hours before surgery.  Contact lenses, hearing aids and dentures may not be worn into surgery.   Do not bring valuables to the hospital. [facility] is not responsible for any missing/lost belongings or valuables.   Bring your C-PAP to the hospital in case you may  have to spend the night.   Notify your doctor if there is any change in your medical condition (cold, fever, infection, rash).  Wear comfortable clothing (specific to your surgery type) to the hospital.  After surgery, you can help prevent lung complications by doing breathing exercises.  Take deep breaths and cough every 1-2 hours. Your doctor may order a device called an Incentive Spirometer to help you take deep breaths.  If you are being admitted to the hospital  overnight, leave your suitcase in the car. After surgery it may be brought to your room.  In case of increased patient census, it may be necessary for you, the patient, to continue your postoperative care in the Same Day Surgery department.  Please call the Pre-admissions Testing Dept. at [phone] if you have any questions about these instructions.  Surgery Visitation Policy:  Patients having surgery or a procedure may have two visitors.  Children under the age of 13 must have an adult with them who is not the patient.  Inpatient Visitation:    Visiting hours are 7 a.m. to 8 p.m. Up to four visitors are allowed at one time in a patient room. The visitors may rotate out with other people during the day.  One visitor age 33 or older may stay with the patient overnight and must be in the room by 8 p.m.   Merchandiser, retail to address health-related social needs:  [email or web address]    Pre-operative 4 CHG Bath Instructions   You can play a key role in reducing the risk of infection after surgery. Your skin needs to be as free of germs as possible. You can reduce the number of germs on your skin by washing with CHG (chlorhexidine gluconate) soap before surgery. CHG is an antiseptic soap that kills germs and continues to kill germs even after washing.   DO NOT use if you have an allergy to chlorhexidine/CHG or antibacterial soaps. If your skin becomes reddened or irritated, stop using the CHG and notify one of our RNs at [phone].   Please shower with the CHG soap starting 4 days before surgery using the following schedule:   Thursday 05/05/24 - Sunday 05/08/24    Please keep in mind the following:  DO NOT shave, including legs and underarms, starting the day of your first shower.   You may shave your face at any point before/day of surgery.  Place clean sheets on your bed the day you start using CHG soap. Use a clean washcloth (not used since being  washed) for each shower. DO NOT sleep with pets once you start using the CHG.   CHG Shower Instructions:  If you choose to wash your hair and private area, wash first with your normal shampoo/soap.  After you use shampoo/soap, rinse your hair and body thoroughly to remove shampoo/soap residue.  Turn the water OFF and apply about 3 tablespoons (45 ml) of CHG soap to a CLEAN washcloth.  Apply CHG soap ONLY FROM YOUR NECK DOWN TO YOUR TOES (washing for 3-5 minutes)  DO NOT use CHG soap on face, private areas, open wounds, or sores.  Pay special attention to the area where your surgery is being performed.  If you are having back surgery, having someone wash your back for you may be helpful. Wait 2 minutes after CHG soap is applied, then you may rinse off the CHG soap.  Pat dry with a clean towel  Put on clean clothes/pajamas   If you choose  to wear lotion, please use ONLY the CHG-compatible lotions on the back of this paper.     Additional instructions for the day of surgery: DO NOT APPLY any lotions, deodorants, cologne, or perfumes.   Put on clean/comfortable clothes.  Brush your teeth.  Ask your nurse before applying any prescription medications to the skin.      CHG Compatible Lotions   Aveeno Moisturizing lotion  Cetaphil Moisturizing Cream  Cetaphil Moisturizing Lotion  Clairol Herbal Essence Moisturizing Lotion, Dry Skin  Clairol Herbal Essence Moisturizing Lotion, Extra Dry Skin  Clairol Herbal Essence Moisturizing Lotion, Normal Skin  Curel Age Defying Therapeutic Moisturizing Lotion with Alpha Hydroxy  Curel Extreme Care Body Lotion  Curel Soothing Hands Moisturizing Hand Lotion  Curel Therapeutic Moisturizing Cream, Fragrance-Free  Curel Therapeutic Moisturizing Lotion, Fragrance-Free  Curel Therapeutic Moisturizing Lotion, Original Formula  Eucerin Daily Replenishing Lotion  Eucerin Dry Skin Therapy Plus Alpha Hydroxy Crme  Eucerin Dry Skin Therapy Plus Alpha  Hydroxy Lotion  Eucerin Original Crme  Eucerin Original Lotion  Eucerin Plus Crme Eucerin Plus Lotion  Eucerin TriLipid Replenishing Lotion  Keri Anti-Bacterial Hand Lotion  Keri Deep Conditioning Original Lotion Dry Skin Formula Softly Scented  Keri Deep Conditioning Original Lotion, Fragrance Free Sensitive Skin Formula  Keri Lotion Fast Absorbing Fragrance Free Sensitive Skin Formula  Keri Lotion Fast Absorbing Softly Scented Dry Skin Formula  Keri Original Lotion  Keri Skin Renewal Lotion Keri Silky Smooth Lotion  Keri Silky Smooth Sensitive Skin Lotion  Nivea Body Creamy Conditioning Oil  Nivea Body Extra Enriched Lotion  Nivea Body Original Lotion  Nivea Body Sheer Moisturizing Lotion Nivea Crme  Nivea Skin Firming Lotion  NutraDerm 30 Skin Lotion  NutraDerm Skin Lotion  NutraDerm Therapeutic Skin Cream  NutraDerm Therapeutic Skin Lotion  ProShield Protective Hand Cream  Provon moisturizing lotion  How to Use an Incentive Spirometer  An incentive spirometer is a tool that measures how well you are filling your lungs with each breath. Learning to take long, deep breaths using this tool can help you keep your lungs clear and active. This may help to reverse or lessen your chance of developing breathing (pulmonary) problems, especially infection. You may be asked to use a spirometer: After a surgery. If you have a lung problem or a history of smoking. After a long period of time when you have been unable to move or be active. If the spirometer includes an indicator to show the highest number that you have reached, your health care provider or respiratory therapist will help you set a goal. Keep a log of your progress as told by your health care provider. What are the risks? Breathing too quickly may cause dizziness or cause you to pass out. Take your time so you do not get dizzy or light-headed. If you are in pain, you may need to take pain medicine before doing incentive  spirometry. It is harder to take a deep breath if you are having pain. How to use your incentive spirometer  Sit up on the edge of your bed or on a chair. Hold the incentive spirometer so that it is in an upright position. Before you use the spirometer, breathe out normally. Place the mouthpiece in your mouth. Make sure your lips are closed tightly around it. Breathe in slowly and as deeply as you can through your mouth, causing the piston or the ball to rise toward the top of the chamber. Hold your breath for 3-5 seconds, or for as long  as possible. If the spirometer includes a coach indicator, use this to guide you in breathing. Slow down your breathing if the indicator goes above the marked areas. Remove the mouthpiece from your mouth and breathe out normally. The piston or ball will return to the bottom of the chamber. Rest for a few seconds, then repeat the steps 10 or more times. Take your time and take a few normal breaths between deep breaths so that you do not get dizzy or light-headed. Do this every 1-2 hours when you are awake. If the spirometer includes a goal marker to show the highest number you have reached (best effort), use this as a goal to work toward during each repetition. After each set of 10 deep breaths, cough a few times. This will help to make sure that your lungs are clear. If you have an incision on your chest or abdomen from surgery, place a pillow or a rolled-up towel firmly against the incision when you cough. This can help to reduce pain while taking deep breaths and coughing. General tips When you are able to get out of bed: Walk around often. Continue to take deep breaths and cough in order to clear your lungs. Keep using the incentive spirometer until your health care provider says it is okay to stop using it. If you have been in the hospital, you may be told to keep using the spirometer at home. Contact a health care provider if: You are having difficulty  using the spirometer. You have trouble using the spirometer as often as instructed. Your pain medicine is not giving enough relief for you to use the spirometer as told. You have a fever. Get help right away if: You develop shortness of breath. You develop a cough with bloody mucus from the lungs. You have fluid or blood coming from an incision site after you cough. Summary An incentive spirometer is a tool that can help you learn to take long, deep breaths to keep your lungs clear and active. You may be asked to use a spirometer after a surgery, if you have a lung problem or a history of smoking, or if you have been inactive for a long period of time. Use your incentive spirometer as instructed every 1-2 hours while you are awake. If you have an incision on your chest or abdomen, place a pillow or a rolled-up towel firmly against your incision when you cough. This will help to reduce pain. Get help right away if you have shortness of breath, you cough up bloody mucus, or blood comes from your incision when you cough. This information is not intended to replace advice given to you by your health care provider. Make sure you discuss any questions you have with your health care provider. Document Revised: 09/19/2019 Document Reviewed: 09/19/2019 Elsevier Patient Education  2023 [employer].

## 2024-05-04 LAB — IGE: IgE (Immunoglobulin E), Serum: 5 [IU]/mL — ABNORMAL LOW (ref 6–495)

## 2024-05-06 ENCOUNTER — Encounter: Payer: Self-pay | Admitting: Orthopedic Surgery

## 2024-05-09 DIAGNOSIS — Z880 Allergy status to penicillin: Secondary | ICD-10-CM

## 2024-05-09 DIAGNOSIS — L409 Psoriasis, unspecified: Secondary | ICD-10-CM

## 2024-05-09 DIAGNOSIS — R7303 Prediabetes: Secondary | ICD-10-CM

## 2024-05-09 DIAGNOSIS — M1711 Unilateral primary osteoarthritis, right knee: Secondary | ICD-10-CM

## 2024-05-09 DIAGNOSIS — M350C Sjogren syndrome with dental involvement: Secondary | ICD-10-CM

## 2024-05-11 ENCOUNTER — Encounter: Payer: Self-pay | Admitting: Family Medicine

## 2024-05-13 ENCOUNTER — Other Ambulatory Visit: Payer: Self-pay | Admitting: Family Medicine

## 2024-05-13 DIAGNOSIS — J301 Allergic rhinitis due to pollen: Secondary | ICD-10-CM

## 2024-05-13 DIAGNOSIS — M62830 Muscle spasm of back: Secondary | ICD-10-CM

## 2024-05-13 MED ORDER — DICLOFENAC SODIUM 1 % EX GEL: 4.0000 g | Freq: Four times a day (QID) | CUTANEOUS | 2 refills | Status: AC

## 2024-05-13 MED ORDER — FLUTICASONE PROPIONATE 50 MCG/ACT NA SUSP
2.0000 | Freq: Every day | NASAL | 2 refills | Status: AC | PRN
Start: 2024-05-13 — End: ?

## 2024-05-16 ENCOUNTER — Encounter: Payer: Self-pay | Admitting: Family Medicine

## 2024-05-16 ENCOUNTER — Telehealth: Admitting: Family Medicine

## 2024-05-16 VITALS — BP 134/67

## 2024-05-16 DIAGNOSIS — G4733 Obstructive sleep apnea (adult) (pediatric): Secondary | ICD-10-CM | POA: Diagnosis not present

## 2024-05-16 DIAGNOSIS — I10 Essential (primary) hypertension: Secondary | ICD-10-CM

## 2024-05-16 DIAGNOSIS — J301 Allergic rhinitis due to pollen: Secondary | ICD-10-CM

## 2024-05-16 NOTE — Progress Notes (Signed)
 MyChart Video Visit    Virtual Visit via Video Note   This format is felt to be most appropriate for this patient at this time. Physical exam was limited by quality of the video and audio technology used for the visit.   Patient location: Patient's home address   Provider location: [facility]  [address], [address]  [place], [place] [address]   I discussed the limitations of evaluation and management by telemedicine and the availability of in person appointments. The patient expressed understanding and agreed to proceed.  Patient: Jordan Baker   DOB: 1961-01-03   63 y.o. Female  MRN: [id number] Visit Date: 05/16/2024  Today's healthcare provider: Rockie Agent, MD   No chief complaint on file.  Subjective    HPI   Discussed the use of AI scribe software for clinical note transcription with the patient, who gave verbal consent to proceed.  History of Present Illness Jordan Baker is a 63 year old female who presents for a virtual visit to discuss CPAP machine replacement parts and medication refills.  She has been using a ResMed AirSense CPAP machine for approximately 13 to 15 years and requires replacement parts. She is coordinating with [facility], which necessitates a prescription and pressure setting for the machine. Her current pressure setting is 8, with a tube temperature of 72 and a ramp time of 45 minutes. She uses the CPAP machine nightly and for naps, reporting that it helps her feel more rested and rejuvenated.  She is requesting refills for diclofenac  gel and fluticasone  nasal spray, which she refers to as Flonase . The prescription for Flonase  was sent in on the previous Friday.  She is scheduled for knee replacement surgery on November 12th. Her recent lab work showed an A1c level of 5.4, which has decreased from a previous level of 5.8. Her knee problems have limited her usual activities.  She underwent a MRSA and  staphylococcus aureus nasal screening, which returned positive.     Past Medical History:  Diagnosis Date   Actinic keratosis 10/26/2009   Right upper arm.   Anxiety    a.) on scheduled SSRI (sertraline ) + PRN BZO (alprazolam )   Basal cell carcinoma 07/12/2013   Right nasal dorsum. Nodular pattern.   Dysplastic nevus 11/05/2007   Left thigh. Moderate atypia. Close to surgical margin.   GERD (gastroesophageal reflux disease)    Hyperlipidemia    Hypothyroidism    OSA on CPAP    Prediabetes    Ramsay Hunt syndrome (geniculate herpes zoster) 2015   a.) initally Dx'd in 2015 with (+) RIGHT eye involvement; (+) recurrent episodes for which she uses valacyclovir  PRN for outbreaks   RLS (restless legs syndrome)    a.) on ropinirole        01/25/2024    1:48 PM 07/03/2023    3:38 PM 07/01/2022    3:16 PM  PHQ9 SCORE ONLY  PHQ-9 Total Score 3  2  4       Data saved with a previous flowsheet row definition       01/25/2024    1:48 PM 07/03/2023    3:38 PM  GAD 7 : Generalized Anxiety Score  Nervous, Anxious, on Edge 1 1  Control/stop worrying 1 0  Worry too much - different things 0 1  Trouble relaxing 0 1  Restless 0 0  Easily annoyed or irritable 0 0  Afraid - awful might happen 0 0  Total GAD 7 Score 2  3  Anxiety Difficulty Not difficult at all Not difficult at all     Medications: Outpatient Medications Prior to Visit  Medication Sig   acetaminophen (TYLENOL) 500 MG tablet Take 1,000 mg by mouth at bedtime.   ALPRAZolam  (XANAX ) 0.5 MG tablet Take 1 tablet (0.5 mg total) by mouth 2 (two) times daily as needed for anxiety. (Patient taking differently: Take 0.5 mg by mouth at bedtime.)   cephALEXin  (KEFLEX ) 250 MG capsule Take 1 capsule (250 mg total) by mouth daily as needed.   clobetasol  (TEMOVATE ) 0.05 % external solution APPLY  TOPICALLY TO AFFECTED AREA  OF SCALP ONCE TO TWICE DAILY AVOID  FACE, groin, axilla (Patient taking differently: Apply 1 Application  topically 2 (two) times daily as needed (psoriasis).)   cyclobenzaprine  (FLEXERIL ) 10 MG tablet Take 1 tablet (10 mg total) by mouth 3 (three) times daily as needed. (Patient taking differently: Take 10 mg by mouth at bedtime.)   diclofenac  Sodium (VOLTAREN ) 1 % GEL Apply 4 g topically 4 (four) times daily.   fluocinolone  0.01 % cream Apply to affected areas face/frontal hairline once to twice daily as needed for psoriasis.   fluticasone  (FLONASE ) 50 MCG/ACT nasal spray Place 2 sprays into both nostrils daily as needed for allergies.   gabapentin  (NEURONTIN ) 100 MG capsule Take 100-300 mg (1-3 capsules) as needed nightly for chronic pain. (Patient taking differently: Take 200 mg by mouth at bedtime.)   Lactobacillus-Inulin (PROBIOTIC DIGESTIVE SUPPORT PO) Take 1 capsule by mouth daily.   levothyroxine  (SYNTHROID ) 100 MCG tablet Take 1 tablet (100 mcg total) by mouth daily.   MAGNESIUM PO Take 400 mg by mouth at bedtime.   NON FORMULARY CPAP (Device) nightly   NON FORMULARY Apply 1 Application topically at bedtime. CBD Roll On   OVER THE COUNTER MEDICATION Take 2 tablets by mouth daily. EMMA   predniSONE  (DELTASONE ) 10 MG tablet Take 1 tablet (10 mg total) by mouth daily with breakfast. (Patient taking differently: Take 10 mg by mouth daily as needed (arthritis).)   Psyllium (EQ DAILY FIBER) 400 MG CAPS Take 800 mg by mouth daily.   rOPINIRole  (REQUIP ) 0.5 MG tablet Take 1 tablet (0.5 mg total) by mouth at bedtime.   rosuvastatin  (CRESTOR ) 10 MG tablet Take 1 tablet (10 mg total) by mouth daily. (Patient taking differently: Take 10 mg by mouth at bedtime.)   sertraline  (ZOLOFT ) 100 MG tablet Take 1 tablet (100 mg total) by mouth daily. (Patient taking differently: Take 50 mg by mouth daily.)   valACYclovir  (VALTREX ) 500 MG tablet Take 1 tablet (500 mg total) by mouth 3 (three) times daily. (Patient taking differently: Take 500 mg by mouth 3 (three) times daily as needed (outbreaks).)   No  facility-administered medications prior to visit.    Review of Systems  Last CBC Lab Results  Component Value Date   WBC 5.9 05/03/2024   HGB 13.0 05/03/2024   HCT 38.6 05/03/2024   MCV 90.0 05/03/2024   MCH 30.3 05/03/2024   RDW 12.5 05/03/2024   PLT 287 05/03/2024   Last metabolic panel Lab Results  Component Value Date   GLUCOSE 92 05/03/2024   NA 142 05/03/2024   K 3.7 05/03/2024   CL 107 05/03/2024   CO2 26 05/03/2024   BUN 17 05/03/2024   CREATININE 0.64 05/03/2024   GFRNONAA >60 05/03/2024   CALCIUM  9.4 05/03/2024   PROT 7.8 05/03/2024   ALBUMIN 4.2 05/03/2024   LABGLOB 2.1 06/26/2023   AGRATIO 2.0 06/17/2022  BILITOT 0.3 05/03/2024   ALKPHOS 71 05/03/2024   AST 25 05/03/2024   ALT 22 05/03/2024   ANIONGAP 9 05/03/2024   Last lipids Lab Results  Component Value Date   CHOL 191 06/26/2023   HDL 58 06/26/2023   LDLCALC 107 (H) 06/26/2023   TRIG 146 06/26/2023   CHOLHDL 3.3 06/26/2023   Last hemoglobin A1c Lab Results  Component Value Date   HGBA1C 5.4 05/03/2024   Last thyroid  functions Lab Results  Component Value Date   TSH 0.895 01/25/2024   T4TOTAL 9.1 05/22/2020   FREET4 1.36 01/25/2024   Last vitamin D  Lab Results  Component Value Date   VD25OH 56.8 06/17/2022   Last vitamin B12 and Folate Lab Results  Component Value Date   VITAMINB12 553 06/17/2022        Objective    BP 134/67 (Cuff Size: Normal) Comment: home BP  LMP 06/20/2018 Comment: no urine pregnancy needed per Dr. Stevan  BP Readings from Last 3 Encounters:  05/16/24 134/67  05/03/24 (!) 152/81  01/25/24 132/82   Wt Readings from Last 3 Encounters:  05/03/24 172 lb (78 kg)  01/25/24 166 lb (75.3 kg)  08/21/23 158 lb (71.7 kg)       Physical Exam Vitals reviewed.  Constitutional:      General: She is not in acute distress.    Appearance: Normal appearance. She is not ill-appearing.  Pulmonary:     Effort: Pulmonary effort is normal. No respiratory  distress.  Neurological:     Mental Status: She is alert and oriented to person, place, and time.  Psychiatric:        Mood and Affect: Mood normal.        Behavior: Behavior normal.        Thought Content: Thought content normal.       Assessment & Plan     Problem List Items Addressed This Visit     Chronic seasonal allergic rhinitis due to pollen    Allergic rhinitis due to pollen,chronic condition  Allergic rhinitis is managed with fluticasone  nasal spray. She requested a refill, which has been sent to the pharmacy. - Refilled fluticasone  nasal spray prescription.      OSA on CPAP - Primary   Obstructive sleep apnea Chronic condition  Well-managed with CPAP therapy. She uses the CPAP machine nightly and reports improved restfulness. Current pressure setting is 8 cm H2O. She has been using the same ResMed AirSense machine for approximately 13-15 years and is interested in obtaining a new machine as a backup. - Sent prescription for CPAP machine replacement parts to [facility] with current pressure setting of 8 cm H2O.  Patient advised to continue CPAP nightly  The patient has had significant benefit from the use of CPAP machine with considerable improvements in quality of life, work production and decrease medical complaints.  The patient will need continued maintenance and care CPAP supplies (including upgrades as deemed appropriate) in order to continue treatment for sleep apnea as this is medically necessary.         Relevant Orders   For home use only DME continuous positive airway pressure (CPAP)   Primary hypertension   Chronic  Continue lifestyle management for BP        Assessment and Plan Assessment & Plan .      Return in about 7 weeks (around 07/04/2024) for CPE.     I discussed the assessment and treatment plan with the patient. The patient was  provided an opportunity to ask questions and all were answered. The patient agreed with the plan and  demonstrated an understanding of the instructions.   The patient was advised to call back or seek an in-person evaluation if the symptoms worsen or if the condition fails to improve as anticipated.  I provided 20 minutes of non-face-to-face time during this encounter.   Jordan Agent, MD [facility] [phone] (phone) [phone] (fax)  [facility]

## 2024-05-16 NOTE — Patient Instructions (Signed)
 To keep you healthy, please keep in mind the following health maintenance items that you are due for:   Health Maintenance Due  Topic Date Due   Pneumococcal Vaccine: 50+ Years (1 of 1 - PCV) Never done   COVID-19 Vaccine (2 - Janssen risk series) 08/23/2020   Zoster Vaccines- Shingrix (2 of 2) 08/23/2021   Influenza Vaccine  02/12/2024     Best Wishes,   Dr. Lang

## 2024-05-16 NOTE — Assessment & Plan Note (Signed)
 Obstructive sleep apnea Chronic condition  Well-managed with CPAP therapy. She uses the CPAP machine nightly and reports improved restfulness. Current pressure setting is 8 cm H2O. She has been using the same ResMed AirSense machine for approximately 13-15 years and is interested in obtaining a new machine as a backup. - Sent prescription for CPAP machine replacement parts to [facility] with current pressure setting of 8 cm H2O.  Patient advised to continue CPAP nightly  The patient has had significant benefit from the use of CPAP machine with considerable improvements in quality of life, work production and decrease medical complaints.  The patient will need continued maintenance and care CPAP supplies (including upgrades as deemed appropriate) in order to continue treatment for sleep apnea as this is medically necessary.

## 2024-05-16 NOTE — Assessment & Plan Note (Signed)
  Allergic rhinitis due to pollen,chronic condition  Allergic rhinitis is managed with fluticasone  nasal spray. She requested a refill, which has been sent to the pharmacy. - Refilled fluticasone  nasal spray prescription.

## 2024-05-16 NOTE — Assessment & Plan Note (Signed)
 Chronic  Continue lifestyle management for BP

## 2024-05-23 ENCOUNTER — Encounter
Admission: RE | Admit: 2024-05-23 | Discharge: 2024-05-23 | Disposition: A | Discharge status: Home / Self Care | Source: Ambulatory Visit | Attending: Orthopedic Surgery | Admitting: Orthopedic Surgery

## 2024-05-23 NOTE — Patient Instructions (Signed)
 Your procedure is scheduled on: 05/25/24  Report to the Registration Desk on the 1st floor of the [facility]. To find out your arrival time, please call [phone] between 1PM - 3PM on: 05/24/24  If your arrival time is 6:00 am, do not arrive before that time as the Medical Mall entrance doors do not open until 6:00 am.   REMEMBER: Instructions that are not followed completely may result in serious medical risk, up to and including death; or upon the discretion of your surgeon and anesthesiologist your surgery may need to be rescheduled.   Do not eat food after midnight the night before surgery.  No gum chewing or hard candies.   You may however, drink CLEAR liquids up to 2 hours before you are scheduled to arrive for your surgery. Do not drink anything within 2 hours of your scheduled arrival time.   Clear liquids include: - water  - apple juice without pulp - gatorade (not RED colors) - black coffee or tea (Do NOT add milk or creamers to the coffee or tea) Do NOT drink anything that is not on this list.   In addition, your doctor has ordered for you to drink the provided:  Gatorade G2 Drinking this carbohydrate drink up to two hours before surgery helps to reduce insulin resistance and improve patient outcomes. Please complete drinking 2 hours before scheduled arrival time.   One week prior to surgery: Stop Anti-inflammatories (NSAIDS) such as diclofenac  Sodium ,Advil, Aleve, Ibuprofen, Motrin, Naproxen, Naprosyn and Aspirin based products such as Excedrin, Goody's Powder, BC Powder. You may continue to take Tylenol if needed for pain up until the day of surgery.    Stop ANY OVER THE COUNTER supplements until after surgery : CBD Roll On      ON THE DAY OF SURGERY ONLY TAKE THESE MEDICATIONS WITH SIPS OF WATER:   levothyroxine  (SYNTHROID ) 100 MCG tablet  sertraline  (ZOLOFT ) 100 MG tablet    No Alcohol for 24 hours before or after surgery.   No Smoking including  e-cigarettes for 24 hours before surgery.  No chewable tobacco products for at least 6 hours before surgery.  No nicotine patches on the day of surgery.   Do not use any recreational drugs for at least a week (preferably 2 weeks) before your surgery.  Please be advised that the combination of cocaine and anesthesia may have negative outcomes, up to and including death. If you test positive for cocaine, your surgery will be cancelled.   On the morning of surgery brush your teeth with toothpaste and water, you may rinse your mouth with mouthwash if you wish. Do not swallow any toothpaste or mouthwash.   Use CHG Soap or wipes as directed on instruction sheet.   Do not wear jewelry, make-up, hairpins, clips or nail polish.   For welded (permanent) jewelry: bracelets, anklets, waist bands, etc.  Please have this removed prior to surgery.  If it is not removed, there is a chance that hospital personnel will need to cut it off on the day of surgery.   Do not wear lotions, powders, or perfumes on the day of surgery    Do not shave body hair from the neck down 48 hours before surgery.   Contact lenses, hearing aids and dentures may not be worn into surgery.    Do not bring valuables to the hospital. [facility] is not responsible for any missing/lost belongings or valuables.    Bring your C-PAP to the hospital  in case you may have to spend the night.    Notify your doctor if there is any change in your medical condition (cold, fever, infection, rash).   Wear comfortable clothing (specific to your surgery type) to the hospital.   After surgery, you can help prevent lung complications by doing breathing exercises.  Take deep breaths and cough every 1-2 hours. Your doctor may order a device called an Incentive Spirometer to help you take deep breaths.   If you are being admitted to the hospital overnight, leave your suitcase in the car. After surgery it may be brought to your room.   In  case of increased patient census, it may be necessary for you, the patient, to continue your postoperative care in the Same Day Surgery department.   Please call the Pre-admissions Testing Dept. at [phone] if you have any questions about these instructions.   Surgery Visitation Policy:   Patients having surgery or a procedure may have two visitors.  Children under the age of 79 must have an adult with them who is not the patient.   Inpatient Visitation:     Visiting hours are 7 a.m. to 8 p.m. Up to four visitors are allowed at one time in a patient room. The visitors may rotate out with other people during the day.  One visitor age 62 or older may stay with the patient overnight and must be in the room by 8 p.m.     Merchandiser, Retail to address health-related social needs:  [email or web address]

## 2024-05-25 ENCOUNTER — Encounter
Admission: RE | Disposition: A | Discharge status: Home / Self Care | Payer: Self-pay | Source: Home / Self Care | Attending: Orthopedic Surgery

## 2024-05-25 ENCOUNTER — Ambulatory Visit: Payer: Self-pay

## 2024-05-25 ENCOUNTER — Ambulatory Visit: Payer: Self-pay | Admitting: Urgent Care

## 2024-05-25 ENCOUNTER — Other Ambulatory Visit: Payer: Self-pay

## 2024-05-25 ENCOUNTER — Observation Stay
Admission: RE | Admit: 2024-05-25 | Discharge: 2024-05-26 | Disposition: A | Discharge status: Home / Self Care | Attending: Orthopedic Surgery | Admitting: Orthopedic Surgery

## 2024-05-25 ENCOUNTER — Observation Stay

## 2024-05-25 ENCOUNTER — Encounter: Payer: Self-pay | Admitting: Orthopedic Surgery

## 2024-05-25 DIAGNOSIS — Z7982 Long term (current) use of aspirin: Secondary | ICD-10-CM | POA: Diagnosis not present

## 2024-05-25 DIAGNOSIS — R7303 Prediabetes: Secondary | ICD-10-CM

## 2024-05-25 DIAGNOSIS — Z79899 Other long term (current) drug therapy: Secondary | ICD-10-CM | POA: Insufficient documentation

## 2024-05-25 DIAGNOSIS — E039 Hypothyroidism, unspecified: Secondary | ICD-10-CM | POA: Insufficient documentation

## 2024-05-25 DIAGNOSIS — I10 Essential (primary) hypertension: Secondary | ICD-10-CM | POA: Insufficient documentation

## 2024-05-25 DIAGNOSIS — L409 Psoriasis, unspecified: Secondary | ICD-10-CM

## 2024-05-25 DIAGNOSIS — M1711 Unilateral primary osteoarthritis, right knee: Principal | ICD-10-CM | POA: Insufficient documentation

## 2024-05-25 DIAGNOSIS — Z96651 Presence of right artificial knee joint: Secondary | ICD-10-CM

## 2024-05-25 DIAGNOSIS — M350C Sjogren syndrome with dental involvement: Secondary | ICD-10-CM

## 2024-05-25 DIAGNOSIS — Z880 Allergy status to penicillin: Secondary | ICD-10-CM

## 2024-05-25 HISTORY — PX: KNEE ARTHROPLASTY: SHX992

## 2024-05-25 SURGERY — ARTHROPLASTY, KNEE, TOTAL, USING IMAGELESS COMPUTER-ASSISTED NAVIGATION
Anesthesia: General | Site: Knee | Laterality: Right

## 2024-05-25 MED ORDER — TRANEXAMIC ACID-NACL 1000-0.7 MG/100ML-% IV SOLN
1000.0000 mg | Freq: Once | INTRAVENOUS | Status: AC
  Administered 2024-05-25: 1000 mg via INTRAVENOUS

## 2024-05-25 MED ORDER — CEFAZOLIN SODIUM-DEXTROSE 2-4 GM/100ML-% IV SOLN
2.0000 g | INTRAVENOUS | Status: AC
  Administered 2024-05-25: 2 g via INTRAVENOUS

## 2024-05-25 MED ORDER — CHLORHEXIDINE GLUCONATE 0.12 % MT SOLN
OROMUCOSAL | Status: AC
  Filled 2024-05-25: qty 15

## 2024-05-25 MED ORDER — GABAPENTIN 300 MG PO CAPS
300.0000 mg | ORAL_CAPSULE | Freq: Once | ORAL | Status: AC
  Administered 2024-05-25: 300 mg via ORAL

## 2024-05-25 MED ORDER — CHLORHEXIDINE GLUCONATE 4 % EX SOLN: 60.0000 mL | Freq: Once | CUTANEOUS | Status: DC

## 2024-05-25 MED ORDER — ONDANSETRON HCL 4 MG/2ML IJ SOLN: 4.0000 mg | Freq: Four times a day (QID) | INTRAMUSCULAR | Status: DC | PRN

## 2024-05-25 MED ORDER — OXYCODONE HCL 5 MG PO TABS
5.0000 mg | ORAL_TABLET | ORAL | Status: DC | PRN
  Administered 2024-05-25 – 2024-05-26 (×3): 5 mg via ORAL
  Filled 2024-05-25 (×3): qty 1

## 2024-05-25 MED ORDER — BUPIVACAINE HCL (PF) 0.5 % IJ SOLN
INTRAMUSCULAR | Status: AC
  Filled 2024-05-25: qty 10

## 2024-05-25 MED ORDER — DEXAMETHASONE SOD PHOSPHATE PF 10 MG/ML IJ SOLN
8.0000 mg | Freq: Once | INTRAMUSCULAR | Status: AC
  Administered 2024-05-25: 8 mg via INTRAVENOUS

## 2024-05-25 MED ORDER — ACETAMINOPHEN 10 MG/ML IV SOLN
1000.0000 mg | Freq: Four times a day (QID) | INTRAVENOUS | Status: DC
Start: 2024-05-25 — End: 2024-05-26
  Administered 2024-05-25 – 2024-05-26 (×3): 1000 mg via INTRAVENOUS
  Filled 2024-05-25 (×4): qty 100

## 2024-05-25 MED ORDER — FENTANYL CITRATE (PF) 100 MCG/2ML IJ SOLN
INTRAMUSCULAR | Status: DC | PRN
  Administered 2024-05-25 (×4): 25 ug via INTRAVENOUS

## 2024-05-25 MED ORDER — ORAL CARE MOUTH RINSE: 15.0000 mL | Freq: Once | OROMUCOSAL | Status: AC

## 2024-05-25 MED ORDER — OXYCODONE HCL 5 MG/5ML PO SOLN: 5.0000 mg | Freq: Once | ORAL | Status: AC | PRN

## 2024-05-25 MED ORDER — FERROUS SULFATE 325 (65 FE) MG PO TABS
325.0000 mg | ORAL_TABLET | Freq: Two times a day (BID) | ORAL | Status: DC
  Administered 2024-05-25 – 2024-05-26 (×2): 325 mg via ORAL
  Filled 2024-05-25 (×2): qty 1

## 2024-05-25 MED ORDER — PROPOFOL 1000 MG/100ML IV EMUL
INTRAVENOUS | Status: AC
  Filled 2024-05-25: qty 100

## 2024-05-25 MED ORDER — SODIUM CHLORIDE 0.9 % IV SOLN: INTRAVENOUS | Status: DC

## 2024-05-25 MED ORDER — PSYLLIUM 95 % PO PACK
1.0000 | PACK | Freq: Every day | ORAL | Status: DC
  Administered 2024-05-25 – 2024-05-26 (×2): 1 via ORAL
  Filled 2024-05-25 (×2): qty 1

## 2024-05-25 MED ORDER — MENTHOL 3 MG MT LOZG: 1.0000 | LOZENGE | OROMUCOSAL | Status: DC | PRN

## 2024-05-25 MED ORDER — ONDANSETRON HCL 4 MG PO TABS: 4.0000 mg | ORAL_TABLET | Freq: Four times a day (QID) | ORAL | Status: DC | PRN

## 2024-05-25 MED ORDER — BUPIVACAINE LIPOSOME 1.3 % IJ SUSP
INTRAMUSCULAR | Status: AC
  Filled 2024-05-25: qty 20

## 2024-05-25 MED ORDER — MAGNESIUM OXIDE 400 MG PO TABS
400.0000 mg | ORAL_TABLET | Freq: Every day | ORAL | Status: DC
  Administered 2024-05-25: 400 mg via ORAL
  Filled 2024-05-25 (×2): qty 1

## 2024-05-25 MED ORDER — ACETAMINOPHEN 10 MG/ML IV SOLN
INTRAVENOUS | Status: AC
  Filled 2024-05-25: qty 100

## 2024-05-25 MED ORDER — LIDOCAINE HCL (PF) 2 % IJ SOLN
INTRAMUSCULAR | Status: AC
  Filled 2024-05-25: qty 5

## 2024-05-25 MED ORDER — OXYCODONE HCL 5 MG PO TABS
ORAL_TABLET | ORAL | Status: AC
  Filled 2024-05-25: qty 1

## 2024-05-25 MED ORDER — MUPIROCIN 2 % EX OINT: 1.0000 | TOPICAL_OINTMENT | Freq: Two times a day (BID) | CUTANEOUS | 0 refills | Status: DC

## 2024-05-25 MED ORDER — BUPIVACAINE HCL (PF) 0.5 % IJ SOLN
INTRAMUSCULAR | Status: DC | PRN
Start: 2024-05-25 — End: 2024-05-25
  Administered 2024-05-25: 3 mL via INTRATHECAL

## 2024-05-25 MED ORDER — FENTANYL CITRATE (PF) 100 MCG/2ML IJ SOLN
INTRAMUSCULAR | Status: AC
  Filled 2024-05-25: qty 2

## 2024-05-25 MED ORDER — ROPINIROLE HCL 0.25 MG PO TABS
0.5000 mg | ORAL_TABLET | Freq: Every day | ORAL | Status: DC
  Administered 2024-05-25: 0.5 mg via ORAL
  Filled 2024-05-25 (×2): qty 2

## 2024-05-25 MED ORDER — MIDAZOLAM HCL 5 MG/5ML IJ SOLN
INTRAMUSCULAR | Status: DC | PRN
  Administered 2024-05-25: 2 mg via INTRAVENOUS

## 2024-05-25 MED ORDER — ALPRAZOLAM 0.5 MG PO TABS
0.5000 mg | ORAL_TABLET | Freq: Every day | ORAL | Status: DC
  Administered 2024-05-25: 0.5 mg via ORAL
  Filled 2024-05-25: qty 2

## 2024-05-25 MED ORDER — PROPOFOL 10 MG/ML IV BOLUS
INTRAVENOUS | Status: DC | PRN
  Administered 2024-05-25 (×2): 20 mg via INTRAVENOUS

## 2024-05-25 MED ORDER — ACETAMINOPHEN 325 MG PO TABS: 325.0000 mg | ORAL_TABLET | Freq: Four times a day (QID) | ORAL | Status: DC | PRN

## 2024-05-25 MED ORDER — MIDAZOLAM HCL 2 MG/2ML IJ SOLN
INTRAMUSCULAR | Status: AC
  Filled 2024-05-25: qty 2

## 2024-05-25 MED ORDER — FLUTICASONE PROPIONATE 50 MCG/ACT NA SUSP: 2.0000 | Freq: Every day | NASAL | Status: DC | PRN

## 2024-05-25 MED ORDER — OXYCODONE HCL 5 MG PO TABS
5.0000 mg | ORAL_TABLET | Freq: Once | ORAL | Status: AC | PRN
  Administered 2024-05-25: 5 mg via ORAL

## 2024-05-25 MED ORDER — TRANEXAMIC ACID-NACL 1000-0.7 MG/100ML-% IV SOLN
INTRAVENOUS | Status: AC
  Filled 2024-05-25: qty 100

## 2024-05-25 MED ORDER — CHLORHEXIDINE GLUCONATE 4 % EX SOLN: 1.0000 | CUTANEOUS | 1 refills | Status: DC

## 2024-05-25 MED ORDER — SERTRALINE HCL 50 MG PO TABS
50.0000 mg | ORAL_TABLET | Freq: Every day | ORAL | Status: DC
  Administered 2024-05-26: 50 mg via ORAL
  Filled 2024-05-25: qty 1

## 2024-05-25 MED ORDER — LEVOTHYROXINE SODIUM 100 MCG PO TABS
100.0000 ug | ORAL_TABLET | Freq: Every day | ORAL | Status: DC
  Administered 2024-05-26: 100 ug via ORAL
  Filled 2024-05-25: qty 1

## 2024-05-25 MED ORDER — SODIUM CHLORIDE (PF) 0.9 % IJ SOLN
INTRAMUSCULAR | Status: AC
  Filled 2024-05-25: qty 40

## 2024-05-25 MED ORDER — CEFAZOLIN SODIUM-DEXTROSE 2-4 GM/100ML-% IV SOLN
INTRAVENOUS | Status: AC
Start: 2024-05-25 — End: 2024-05-25
  Filled 2024-05-25: qty 100

## 2024-05-25 MED ORDER — PHENOL 1.4 % MT LIQD: 1.0000 | OROMUCOSAL | Status: DC | PRN

## 2024-05-25 MED ORDER — CEFAZOLIN SODIUM-DEXTROSE 2-4 GM/100ML-% IV SOLN
2.0000 g | Freq: Four times a day (QID) | INTRAVENOUS | Status: AC
  Administered 2024-05-25 (×2): 2 g via INTRAVENOUS
  Filled 2024-05-25 (×2): qty 100

## 2024-05-25 MED ORDER — MAGNESIUM HYDROXIDE 400 MG/5ML PO SUSP
30.0000 mL | Freq: Every day | ORAL | Status: DC
  Administered 2024-05-26: 30 mL via ORAL
  Filled 2024-05-25: qty 30

## 2024-05-25 MED ORDER — PANTOPRAZOLE SODIUM 40 MG PO TBEC
40.0000 mg | DELAYED_RELEASE_TABLET | Freq: Two times a day (BID) | ORAL | Status: DC
  Administered 2024-05-25 – 2024-05-26 (×3): 40 mg via ORAL
  Filled 2024-05-25 (×3): qty 1

## 2024-05-25 MED ORDER — METOCLOPRAMIDE HCL 10 MG PO TABS
10.0000 mg | ORAL_TABLET | Freq: Three times a day (TID) | ORAL | Status: DC
  Administered 2024-05-25 – 2024-05-26 (×3): 10 mg via ORAL
  Filled 2024-05-25 (×3): qty 1

## 2024-05-25 MED ORDER — SURGIPHOR WOUND IRRIGATION SYSTEM - OPTIME: TOPICAL | Status: DC | PRN

## 2024-05-25 MED ORDER — TRAMADOL HCL 50 MG PO TABS
50.0000 mg | ORAL_TABLET | ORAL | Status: DC | PRN
  Administered 2024-05-25: 100 mg via ORAL
  Administered 2024-05-26: 50 mg via ORAL
  Administered 2024-05-26: 100 mg via ORAL
  Filled 2024-05-25: qty 2
  Filled 2024-05-25: qty 1
  Filled 2024-05-25: qty 2

## 2024-05-25 MED ORDER — SODIUM CHLORIDE (PF) 0.9 % IJ SOLN
INTRAMUSCULAR | Status: DC | PRN
  Administered 2024-05-25: 100 mL via INTRAMUSCULAR

## 2024-05-25 MED ORDER — PROPOFOL 500 MG/50ML IV EMUL
INTRAVENOUS | Status: DC | PRN
  Administered 2024-05-25: 50 ug/kg/min via INTRAVENOUS

## 2024-05-25 MED ORDER — SODIUM CHLORIDE 0.9 % IR SOLN
Status: DC | PRN
  Administered 2024-05-25: 3000 mL

## 2024-05-25 MED ORDER — BUPIVACAINE HCL (PF) 0.25 % IJ SOLN
INTRAMUSCULAR | Status: AC
  Filled 2024-05-25: qty 60

## 2024-05-25 MED ORDER — CELECOXIB 200 MG PO CAPS
ORAL_CAPSULE | ORAL | Status: AC
  Filled 2024-05-25: qty 2

## 2024-05-25 MED ORDER — TRANEXAMIC ACID-NACL 1000-0.7 MG/100ML-% IV SOLN
1000.0000 mg | INTRAVENOUS | Status: AC
  Administered 2024-05-25: 1000 mg via INTRAVENOUS

## 2024-05-25 MED ORDER — FENTANYL CITRATE (PF) 100 MCG/2ML IJ SOLN: 25.0000 ug | INTRAMUSCULAR | Status: DC | PRN

## 2024-05-25 MED ORDER — LACTATED RINGERS IV SOLN: INTRAVENOUS | Status: DC

## 2024-05-25 MED ORDER — GABAPENTIN 300 MG PO CAPS
ORAL_CAPSULE | ORAL | Status: AC
  Filled 2024-05-25: qty 1

## 2024-05-25 MED ORDER — CELECOXIB 200 MG PO CAPS
400.0000 mg | ORAL_CAPSULE | Freq: Once | ORAL | Status: AC
  Administered 2024-05-25: 400 mg via ORAL

## 2024-05-25 MED ORDER — HYDROMORPHONE HCL 1 MG/ML IJ SOLN: 0.5000 mg | INTRAMUSCULAR | Status: DC | PRN

## 2024-05-25 MED ORDER — CELECOXIB 200 MG PO CAPS
200.0000 mg | ORAL_CAPSULE | Freq: Two times a day (BID) | ORAL | Status: DC
  Administered 2024-05-25 – 2024-05-26 (×2): 200 mg via ORAL
  Filled 2024-05-25 (×2): qty 1

## 2024-05-25 MED ORDER — ACETAMINOPHEN 10 MG/ML IV SOLN
INTRAVENOUS | Status: DC | PRN
  Administered 2024-05-25: 1000 mg via INTRAVENOUS

## 2024-05-25 MED ORDER — BISACODYL 10 MG RE SUPP: 10.0000 mg | Freq: Every day | RECTAL | Status: DC | PRN

## 2024-05-25 MED ORDER — SENNOSIDES-DOCUSATE SODIUM 8.6-50 MG PO TABS
1.0000 | ORAL_TABLET | Freq: Two times a day (BID) | ORAL | Status: DC
  Administered 2024-05-25 – 2024-05-26 (×3): 1 via ORAL
  Filled 2024-05-25 (×3): qty 1

## 2024-05-25 MED ORDER — ENSURE PRE-SURGERY PO LIQD
296.0000 mL | Freq: Once | ORAL | Status: DC
  Filled 2024-05-25: qty 296

## 2024-05-25 MED ORDER — FLEET ENEMA RE ENEM: 1.0000 | ENEMA | Freq: Once | RECTAL | Status: DC | PRN

## 2024-05-25 MED ORDER — CYCLOBENZAPRINE HCL 10 MG PO TABS
10.0000 mg | ORAL_TABLET | Freq: Every day | ORAL | Status: DC
  Administered 2024-05-25: 10 mg via ORAL
  Filled 2024-05-25: qty 1

## 2024-05-25 MED ORDER — GABAPENTIN 100 MG PO CAPS: 200.0000 mg | ORAL_CAPSULE | Freq: Every day | ORAL | Status: DC

## 2024-05-25 MED ORDER — ROSUVASTATIN CALCIUM 20 MG PO TABS
10.0000 mg | ORAL_TABLET | Freq: Every day | ORAL | Status: DC
  Administered 2024-05-25: 10 mg via ORAL
  Filled 2024-05-25: qty 1

## 2024-05-25 MED ORDER — ALUM & MAG HYDROXIDE-SIMETH 200-200-20 MG/5ML PO SUSP: 30.0000 mL | ORAL | Status: DC | PRN

## 2024-05-25 MED ORDER — CHLORHEXIDINE GLUCONATE 0.12 % MT SOLN
15.0000 mL | Freq: Once | OROMUCOSAL | Status: AC
  Administered 2024-05-25: 15 mL via OROMUCOSAL

## 2024-05-25 MED ORDER — DIPHENHYDRAMINE HCL 12.5 MG/5ML PO ELIX: 12.5000 mg | ORAL_SOLUTION | ORAL | Status: DC | PRN

## 2024-05-25 MED ORDER — OXYCODONE HCL 5 MG PO TABS: 10.0000 mg | ORAL_TABLET | ORAL | Status: DC | PRN

## 2024-05-25 MED ORDER — ONDANSETRON HCL 4 MG/2ML IJ SOLN
INTRAMUSCULAR | Status: DC | PRN
Start: 2024-05-25 — End: 2024-05-25
  Administered 2024-05-25: 4 mg via INTRAVENOUS

## 2024-05-25 MED ORDER — ASPIRIN 81 MG PO CHEW
81.0000 mg | CHEWABLE_TABLET | Freq: Two times a day (BID) | ORAL | Status: DC
  Administered 2024-05-26: 81 mg via ORAL
  Filled 2024-05-25: qty 1

## 2024-05-25 SURGICAL SUPPLY — 63 items
ATTUNE MED DOME PAT 32 KNEE (Knees) IMPLANT
ATTUNE PSFEM RTSZ5 NARCEM KNEE (Femur) IMPLANT
ATTUNE PSRP INSR SZ5 5 KNEE (Insert) IMPLANT
BASEPLATE TIBIAL ROTATING SZ 4 (Knees) IMPLANT
BATTERY INSTRU NAVIGATION (MISCELLANEOUS) ×4 IMPLANT
BIT DRILL QUICK REL 1/8 2PK SL (BIT) ×1 IMPLANT
BLADE CLIPPER SURG (BLADE) IMPLANT
BLADE SAW 70X12.5 (BLADE) ×1 IMPLANT
BLADE SAW 90X13X1.19 OSCILLAT (BLADE) ×1 IMPLANT
BLADE SAW 90X25X1.19 OSCILLAT (BLADE) ×1 IMPLANT
BRUSH SCRUB EZ PLAIN DRY (MISCELLANEOUS) ×1 IMPLANT
CEMENT BONE GENTAMICIN 40 (Cement) IMPLANT
COOLER ICEMAN CLASSIC (MISCELLANEOUS) ×1 IMPLANT
CUFF TRNQT CYL 24X4X16.5-23 (TOURNIQUET CUFF) IMPLANT
CUFF TRNQT CYL 30X4X21-28X (TOURNIQUET CUFF) IMPLANT
DRAPE SHEET LG 3/4 BI-LAMINATE (DRAPES) ×1 IMPLANT
DRSG AQUACEL AG ADV 3.5X14 (GAUZE/BANDAGES/DRESSINGS) ×1 IMPLANT
DRSG MEPILEX SACRM 8.7X9.8 (GAUZE/BANDAGES/DRESSINGS) ×1 IMPLANT
DRSG TEGADERM 4X4.75 (GAUZE/BANDAGES/DRESSINGS) ×1 IMPLANT
DURAPREP 26ML APPLICATOR (WOUND CARE) ×2 IMPLANT
ELECT CAUTERY BLADE 6.4 (BLADE) ×1 IMPLANT
ELECTRODE REM PT RTRN 9FT ADLT (ELECTROSURGICAL) ×1 IMPLANT
EVACUATOR 1/8 PVC DRAIN (DRAIN) ×1 IMPLANT
EX-PIN ORTHOLOCK NAV 4X150 (PIN) ×2 IMPLANT
GAUZE XEROFORM 1X8 LF (GAUZE/BANDAGES/DRESSINGS) ×1 IMPLANT
GLOVE BIOGEL M STRL SZ7.5 (GLOVE) ×6 IMPLANT
GLOVE BIOGEL PI IND STRL 8 (GLOVE) ×1 IMPLANT
GLOVE SRG 8 PF TXTR STRL LF DI (GLOVE) ×1 IMPLANT
GOWN STRL REUS W/ TWL LRG LVL3 (GOWN DISPOSABLE) ×1 IMPLANT
GOWN STRL REUS W/ TWL XL LVL3 (GOWN DISPOSABLE) ×1 IMPLANT
GOWN TOGA ZIPPER T7+ PEEL AWAY (MISCELLANEOUS) ×1 IMPLANT
HOLDER FOLEY CATH W/STRAP (MISCELLANEOUS) ×1 IMPLANT
HOOD PEEL AWAY T7 (MISCELLANEOUS) ×1 IMPLANT
KIT TURNOVER KIT A (KITS) ×1 IMPLANT
KNIFE SCULPS 14X20 (INSTRUMENTS) ×1 IMPLANT
MANIFOLD NEPTUNE II (INSTRUMENTS) ×2 IMPLANT
NDL SPNL 20GX3.5 QUINCKE YW (NEEDLE) ×2 IMPLANT
NEEDLE SPNL 20GX3.5 QUINCKE YW (NEEDLE) ×2 IMPLANT
PACK TOTAL KNEE (MISCELLANEOUS) ×1 IMPLANT
PAD ABD DERMACEA PRESS 5X9 (GAUZE/BANDAGES/DRESSINGS) ×2 IMPLANT
PAD ARMBOARD POSITIONER FOAM (MISCELLANEOUS) ×3 IMPLANT
PAD COLD UNI WRAP-ON (PAD) ×1 IMPLANT
PENCIL SMOKE EVACUATOR COATED (MISCELLANEOUS) ×1 IMPLANT
PIN DRILL FIX HALF THREAD (BIT) ×2 IMPLANT
PIN FIXATION 1/8DIA X 3INL (PIN) ×1 IMPLANT
SOL .9 NS 3000ML IRR UROMATIC (IV SOLUTION) ×1 IMPLANT
SOLN STERILE WATER BTL 1000 ML (IV SOLUTION) ×1 IMPLANT
SOLUTION IRRIG SURGIPHOR (IV SOLUTION) ×1 IMPLANT
SPONGE DRAIN TRACH 4X4 STRL 2S (GAUZE/BANDAGES/DRESSINGS) ×1 IMPLANT
STAPLER SKIN PROX 35W (STAPLE) ×1 IMPLANT
STOCKINETTE IMPERV 14X48 (MISCELLANEOUS) ×1 IMPLANT
STOCKINETTE STRL BIAS CUT 8X4 (MISCELLANEOUS) ×1 IMPLANT
STRAP TIBIA SHORT (MISCELLANEOUS) ×1 IMPLANT
SUCTION TUBE FRAZIER 10FR DISP (SUCTIONS) ×1 IMPLANT
SUT VIC AB 0 CT1 36 (SUTURE) ×1 IMPLANT
SUT VIC AB 1 CT1 36 (SUTURE) ×2 IMPLANT
SUT VIC AB 2-0 CT2 27 (SUTURE) ×1 IMPLANT
SYR 30ML LL (SYRINGE) ×2 IMPLANT
TIP FAN IRRIG PULSAVAC PLUS (DISPOSABLE) ×1 IMPLANT
TOWEL OR 17X26 4PK STRL BLUE (TOWEL DISPOSABLE) IMPLANT
TOWER CARTRIDGE SMART MIX (DISPOSABLE) ×1 IMPLANT
TRAP FLUID SMOKE EVACUATOR (MISCELLANEOUS) ×1 IMPLANT
TRAY FOLEY MTR SLVR 16FR STAT (SET/KITS/TRAYS/PACK) ×1 IMPLANT

## 2024-05-25 NOTE — Transfer of Care (Signed)
 Immediate Anesthesia Transfer of Care Note  Patient: Jordan Baker  Procedure(s) Performed: ARTHROPLASTY, KNEE, TOTAL, USING IMAGELESS COMPUTER-ASSISTED NAVIGATION (Right: Knee)  Patient Location: PACU  Anesthesia Type:Spinal  Level of Consciousness: drowsy  Airway & Oxygen Therapy: Patient Spontanous Breathing and Patient connected to face mask oxygen  Post-op Assessment: Report given to RN and Post -op Vital signs reviewed and stable  Post vital signs: Reviewed and stable  Last Vitals:  Vitals Value Taken Time  BP 114/59 05/25/24 11:02  Temp    Pulse 98 05/25/24 11:03  Resp 12 05/25/24 11:03  SpO2 100 % 05/25/24 11:03  Vitals shown include unfiled device data.  Last Pain:  Vitals:   05/25/24 0624  TempSrc: Temporal  PainSc: 2          Complications: No notable events documented.

## 2024-05-25 NOTE — TOC Initial Note (Signed)
 Transition of Care [facility]) - Initial/Assessment Note    Patient Details  Name: Jordan Baker MRN: [id number] Date of Birth: January 25, 1961  [facility] CM/SW Contact:    Kodee Ravert L Emma Schupp, LCSW Phone Number: 05/25/2024, 4:00 PM  Clinical Narrative:                  RW ordered through [facility]. DME will be delivered to the PO room.        Patient Goals and CMS Choice            Expected Discharge Plan and Services                                              Prior Living Arrangements/Services                       Activities of Daily Living   ADL Screening (condition at time of admission) Independently performs ADLs?: Yes (appropriate for developmental age) Is the patient deaf or have difficulty hearing?: No Does the patient have difficulty seeing, even when wearing glasses/contacts?: No Does the patient have difficulty concentrating, remembering, or making decisions?: No  Permission Sought/Granted                  Emotional Assessment              Admission diagnosis:  Primary osteoarthritis of right knee [M17.11] History of total knee arthroplasty, right [Z96.651] Patient Active Problem List   Diagnosis Date Noted   History of total knee arthroplasty, right 05/25/2024   OSA on CPAP 05/16/2024   Restless legs syndrome (RLS) 01/25/2024   Psoriasis 01/25/2024   Primary osteoarthritis of right knee 01/10/2024   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   Personal history of colon polyps, unspecified 07/03/2023   Prediabetes 07/03/2023   Panic disorder with agoraphobia and moderate panic attacks 03/31/2023   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   Sjogren syndrome with dental involvement 03/31/2023   Closed fracture of distal end of radius 06/02/2022   Chronic prescription benzodiazepine use 09/16/2021   Primary hypertension 09/16/2021   Annual physical exam 06/28/2021    Chronic seasonal allergic rhinitis due to pollen 03/20/2021   Pes anserinus bursitis of left knee 02/02/2018   Hypercholesterolemia 05/31/2015   Hypothyroidism 05/15/2015   PCP:  Sharma Coyer, MD Pharmacy:   [facility] [address], [place] - [address] [place] [address] Phone: [phone] Fax: [phone]     Social Drivers of Health (SDOH) Social History: SDOH Screenings   Food Insecurity: No Food Insecurity (05/25/2024)  Housing: Low Risk  (05/25/2024)  Transportation Needs: No Transportation Needs (05/25/2024)  Utilities: Not At Risk (05/25/2024)  Alcohol Screen: Low Risk  (05/13/2024)  Depression (PHQ2-9): Low Risk  (01/25/2024)  Financial Resource Strain: Low Risk  (05/13/2024)  Physical Activity: Unknown (05/13/2024)  Social Connections: Socially Integrated (05/13/2024)  Stress: No Stress Concern Present (05/13/2024)  Tobacco Use: Low Risk  (05/25/2024)  Health Literacy: Adequate Health Literacy (01/25/2024)   SDOH Interventions:     Readmission Risk Interventions     No data to display

## 2024-05-25 NOTE — H&P (Signed)
 ORTHOPAEDIC HISTORY & PHYSICAL Drake Fonda [place], [place] - 05/03/2024 10:30 AM EDT Formatting of this note is different from the original. NAME: Jordan Baker H&P Date: 05/03/2024 Procedure Date: 05/09/2024  Chief Complaint: right knee pain, swelling, stiffness, and giving way  HPI Jordan Baker is a 63 y.o. female who has severe Right knee pain. Patient reports a longstanding history of right knee discomfort that has progressively gotten worse over the last year. She states that most of her pain localizes along the medial aspect of her knee. She does report intermittent stiffness, swelling and giving way of the knee. Denies any locking or maltracking symptoms. She states that the pain is made worse with prolonged standing and ambulation. It greatly affects her ability to ambulate long distances and perform ADLs that she would like. She has failed conservative treatment including Tylenol, oral and topical NSAIDs, corticosteroid and viscosupplementation injections, and activity modification. She is not currently utilizing any ambulatory aids. She has requested operative intervention for relief of her DJD symptoms. Patient denies any previous cardiac or pulmonary history. No previous DVTs or clots. She is a prediabetic, her last A1c was at 5.8. She denies have any previous surgeries on her right knee. Does however have a history of a left partial knee replacement.  Social Hx: Patient lives at home with her husband who will be helping look after her postoperatively. She admits to about 1 standard drink of alcohol per week. Denies any illicit drug use, smoking or nicotine use.  Medications & Allergies Allergies: Allergies Allergen Reactions Amoxicillin -Pot Clavulanate Rash Nitrofurantoin Monohyd/M-Cryst Rash  Home Medicines: Current Outpatient Medications on File Prior to Visit Medication Sig Dispense Refill ALPRAZolam  (XANAX ) 0.5 MG tablet Take 0.5 mg by mouth nightly as needed for Sleep. apple  cider vinegar 500 mg Tab cephalexin  (KEFLEX ) 250 MG capsule Take 250 mg by mouth once daily as needed cetirizine  (ZYRTEC ) 10 MG tablet Take 10 mg by mouth once daily. clobetasoL  (CLOBEX ) 0.05 % lotion Apply topically 2 (two) times daily cyclobenzaprine  (FLEXERIL ) 10 MG tablet Take 10 mg by mouth as needed.  diclofenac  sodium (VOLTAREN  TOPICAL) Apply topically once daily fluocinolone  0.01 % cream Apply topically fluocinonide 0.1 % Crea APPLY TO THE AFFECTED AREAS FACE/FRONTAL HAIRLINE ONCE TO TWICE DAILY AS NEEDED FOR PSORIASIS fluticasone  propionate (FLONASE ) 50 mcg/actuation nasal spray Place 2 sprays into both nostrils once daily gabapentin  (NEURONTIN ) 100 MG capsule Take 1 capsule by mouth 2 (two) times daily gabapentin  (NEURONTIN ) 300 MG capsule Take 300 mg by mouth at bedtime (Patient not taking: Reported on 04/04/2024) Lactobacillus acidophilus (PROBIOTIC ORAL) levothyroxine  (SYNTHROID ) 100 MCG tablet Take 100 mcg by mouth every morning before breakfast (0630) MAGNESIUM ORAL Take 400 mg by mouth MULTIVITAMIN ORAL Take 1 Tabminder by mouth daily. NON FORMULARY CPAP (Device) nightly pilocarpine (SALAGEN) 5 mg tablet Take 1 tablet (5 mg total) by mouth 3 (three) times daily for 90 days 90 tablet 2 pravastatin  (PRAVACHOL ) 40 MG tablet Take 1 tablet by mouth once daily (Patient not taking: Reported on 04/04/2024) predniSONE  (DELTASONE ) 10 MG tablet Take 10 mg by mouth daily with breakfast rOPINIRole  (REQUIP ) 0.5 MG immediate release tablet Take 0.5 mg by mouth at bedtime rosuvastatin  (CRESTOR ) 10 MG tablet Take 10 mg by mouth once daily sertraline  (ZOLOFT ) 100 MG tablet Take 100 mg by mouth once daily sucralfate  (CARAFATE ) 1 gram tablet UNABLE TO FIND Take by mouth valACYclovir  (VALTREX ) 500 MG tablet Take 500 mg by mouth once as needed VITAMIN D3 ORAL Take by mouth (Patient not  taking: Reported on 04/04/2024)  No current facility-administered medications on file prior to visit.  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 Osteoarthritis Panic disorder with agoraphobia and moderate panic attacks 03/31/2023 Prediabetes 07/03/2023 Ramsay Hunt syndrome (geniculate herpes zoster) Sjogren syndrome with dental involvement (CMS/HHS-HCC) 03/31/2023 Sleep apnea Uses CPAP Wears glasses   Past Surgical History: Procedure Laterality Date RECTAL EUS N/A 02/22/2013 Procedure: RECTAL EUS; Surgeon: Asberry Jenkins Coffee, MD; Location: [facility]; Service: Gastroenterology; Laterality: N/A; RECTAL EUS N/A 02/24/2014 Procedure: RECTAL EUS; Surgeon: Asberry Jenkins Coffee, MD; Location: [facility]; Service: Gastroenterology; Laterality: N/A; Left medial unicompartmental knee arthroplasty 06/2017 Dr. Garnette Raman CESAREAN SECTION twice REMOVAL OVARIAN CYST   Physical Exam  Ht: Wt: BMI: There is no height or weight on file to calculate BMI.  General/Constitutional: No apparent distress: well-nourished and well developed. Eyes: Pupils equal, round with synchronous movement. Lymphatic: No palpable adenopathy. Respiratory: Patient has good chest rise and fall with inspiration and expiration. All lung fields are clear to auscultation bilaterally. There is no Rales, rhonchi or wheezes appreciated. Cardiovascular: Upon auscultation there is a regular rate and rhythm without any murmurs, rubs, gallops or heaves appreciated. There does not appear to be any swelling down the lower extremities. Posterior tibial pulses appreciated bilaterally, 2+. Integumentary: No impressive skin lesions present, except as noted in detailed exam. Neuro/Psych: Normal mood and affect, oriented to person, place and time. Musculoskeletal: see exam below  Right knee exam Upon inspection of the patient's right knee there does not  appear to be any skin changes, open abrasions, swelling or redness. There is a varus alignment. Upon palpation, the patient reports having pain along the medial aspect of their knee. Patient has 4 degrees off of full extension actively with ROM, and able to flex back to 120 degrees with mild pain. Varus and valgus stress testing shows positive psuedolaxity to valgus stressing. The patella tracks well within the femoral groove from flexion into extension with mild crepitus. Anterior and posterior drawer testing negative. Patient is neurovascularly intact down their lower extremity to all dermatomes. Posterior tibial pulses appreciated 2+.  Imaging right Knee Imaging: A series of x-rays were ordered and interpreted the patient's right knee. These images included AP standing, lateral and sunrise views. Upon inspection, there appears to be noticeable narrowing along the medial cartilage space with 100% medial joint space narrowing. Bone-on-bone articulation is noted. Osteophyte formation is present. Subchondral changes are appreciated. Overall alignment is relative varus. No fractures, lytic lesions or gross deformities appreciated on films.  Assesment and Plan Knee DJD  I have recommended that Jordan Baker undergo right total knee replacement. Consents has been signed. The risks, benefits, prognosis and alternatives including but not limited to DVT, PE, infection, neurovascular injury, failure of the procedure and death were explained to the patient and she is willing to proceed with surgery as described to her by myself. Plan will be for post operative admission of at least 1 midnight for pain control and PT. She will be managed with DVT prophylaxis, antibiotics preoperatively for 24 hours and aggressive in patient rehab.  Pre, intra and post op interventions were discussed. Patient has good understanding  Medication Reconciliation was performed. Discussed cessation of NSAIDs, vitamins and  supplements.  A total of 45 minutes was spent reviewing patient's charts, medical reconciliation, discussing/educating the patient about surgical interventions, and answering any questions provided by the  patient.  JOSHUA DALLAS KOYANAGI, PA [facility] 05/03/2024  Electronically signed by Koyanagi Fonda Dallas, PA at 05/04/2024 9:10 PM EDT

## 2024-05-25 NOTE — Discharge Summary (Signed)
 Physician Discharge Summary  Patient ID: Jordan Baker MRN: [id number] DOB/AGE: 14-May-1961 63 y.o.  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   Personal history of colon polyps, unspecified 07/03/2023   Prediabetes 07/03/2023   Panic disorder with agoraphobia and moderate panic attacks 03/31/2023   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   Anxiety    a.) on scheduled SSRI (sertraline ) + PRN BZO (alprazolam )   Basal cell carcinoma 07/12/2013   Right nasal dorsum. Nodular pattern.   Dysplastic nevus 11/05/2007   Left thigh. Moderate atypia. Close to surgical margin.   GERD (gastroesophageal reflux disease)    Hyperlipidemia    Hypothyroidism    OSA on CPAP    Prediabetes    Ramsay Hunt syndrome (geniculate herpes zoster) 2015   a.) initally Dx'd in 2015 with (+) RIGHT eye involvement; (+) recurrent episodes for which she uses valacyclovir  PRN for outbreaks   RLS (restless legs syndrome)    a.) on ropinirole       Transfusion: None.   Consultants (if any):   Discharged Condition: Improved  Hospital Course: Jordan Baker is an 63 y.o. female who was admitted 05/25/2024 with a diagnosis of primary osteoarthritis of the right knee and went to the operating room on 05/25/2024 and underwent the above named procedures.    Surgeries: Procedure(s): ARTHROPLASTY, KNEE, TOTAL, USING IMAGELESS COMPUTER-ASSISTED NAVIGATION on 05/25/2024 Patient tolerated the surgery well. Taken to PACU where she was stabilized and then transferred to the post-op recovery area.  Started on aspirin 81mg  every 12 hrs. Heels elevated on bed with rolled towels. No evidence of DVT. Negative Homan. Physical therapy started on day #0 for gait training and transfer. OT started day #1 for ADL and assisted devices.  Patient's IV and hemovac were removed on POD1.  Foley was removed shortly after surgery.  Implants: DePuy Attune size 5N posterior stabilized femoral component (cemented), size 4 rotating platform tibial component (cemented), 32 mm medialized dome patella (cemented), and a 5 mm stabilized rotating platform polyethylene insert.   She was given perioperative antibiotics:  Anti-infectives (From admission, onward)    Start     Dose/Rate Route Frequency Ordered Stop   05/25/24 1400  ceFAZolin (ANCEF) IVPB 2g/100 mL premix        2 g 200 mL/hr over 30 Minutes Intravenous Every 6 hours 05/25/24 1350 05/25/24 2138   05/25/24 0615  ceFAZolin (ANCEF) IVPB 2g/100 mL premix        2 g 200 mL/hr over 30 Minutes Intravenous On call to O.R. 05/25/24 [id number] 05/25/24 0730     .  She  was given sequential compression devices, early ambulation, and aspirin for DVT prophylaxis.  She benefited maximally from the hospital stay and there were no complications.    Recent vital signs:  Vitals:   05/26/24 0100 05/26/24 0502  BP: 108/62 (!) 119/56  Pulse: 67 76  Resp: 16 15  Temp: 97.7 F (36.5 C) 98.2 F (36.8 C)  SpO2: 98%  97%    Recent laboratory studies:  Lab Results  Component Value Date   HGB 13.0 05/03/2024   HGB 13.1 06/26/2023   HGB 13.0 06/17/2022   Lab Results  Component Value Date   WBC 5.9 05/03/2024   PLT 287 05/03/2024   No results found for: INR Lab Results  Component Value Date   NA 142 05/03/2024   K 3.7 05/03/2024   CL 107 05/03/2024   CO2 26 05/03/2024   BUN 17 05/03/2024   CREATININE 0.64 05/03/2024   GLUCOSE 92 05/03/2024    Discharge Medications:   Allergies as of 05/26/2024       Reactions   Augmentin  [amoxicillin -pot Clavulanate] Rash   IgE <5 (WNL) on 05/03/2024   Nitrofurantoin Monohyd Macro Rash        Medication List     TAKE these medications    acetaminophen 500 MG tablet Commonly known as: TYLENOL Take 1,000 mg by mouth at bedtime.   ALPRAZolam  0.5 MG tablet Commonly known as: XANAX  Take 1 tablet (0.5 mg total) by mouth 2 (two) times daily as needed for anxiety. What changed: when to take this   aspirin 81 MG chewable tablet Chew 1 tablet (81 mg total) by mouth 2 (two) times daily.   celecoxib  200 MG capsule Commonly known as: CELEBREX  Take 1 capsule (200 mg total) by mouth 2 (two) times daily.   cephALEXin  250 MG capsule Commonly known as: KEFLEX  Take 1 capsule (250 mg total) by mouth daily as needed.   chlorhexidine 4 % external liquid Commonly known as: HIBICLENS Apply 15 mLs (1 Application total) topically as directed for 30 doses. Use as directed daily for 5 days every other week for 6 weeks.   clobetasol  0.05 % external solution Commonly known as: TEMOVATE  APPLY  TOPICALLY TO AFFECTED AREA  OF SCALP ONCE TO TWICE DAILY AVOID  FACE, groin, axilla What changed:  how much to take how to take this when to take this reasons to take this additional instructions   cyclobenzaprine  10 MG tablet Commonly known as: FLEXERIL  Take 1 tablet (10 mg total) by mouth 3 (three) times daily as needed. What changed: when to take this    diclofenac  Sodium 1 % Gel Commonly known as: VOLTAREN  Apply 4 g topically 4 (four) times daily.   EQ Daily Fiber 400 MG Caps Generic drug: Psyllium Take 800 mg by mouth daily.   fluocinolone  0.01 % cream Apply to affected areas face/frontal hairline once to twice daily as needed for psoriasis.   fluticasone  50 MCG/ACT nasal spray Commonly known as: FLONASE  Place 2 sprays into both nostrils daily as needed for allergies.   gabapentin  100 MG capsule Commonly known as: NEURONTIN  Take 100-300 mg (1-3 capsules) as needed nightly for chronic pain. What changed:  how much to take how to take this when to take this additional instructions   levothyroxine  100 MCG tablet Commonly known as: SYNTHROID  Take 1 tablet (100 mcg total) by mouth daily.   MAGNESIUM PO Take 400 mg by mouth at bedtime.   mupirocin ointment 2 % Commonly known as: BACTROBAN Place  1 Application into the nose 2 (two) times daily for 60 doses. Use as directed 2 times daily for 5 days every other week for 6 weeks.   NON FORMULARY CPAP (Device) nightly   NON FORMULARY Apply 1 Application topically at bedtime. CBD Roll On   OVER THE COUNTER MEDICATION Take 2 tablets by mouth daily. EMMA   oxyCODONE 5 MG immediate release tablet Commonly known as: Oxy IR/ROXICODONE Take 1 tablet (5 mg total) by mouth every 4 (four) hours as needed for severe pain (pain score 7-10).   predniSONE  10 MG tablet Commonly known as: DELTASONE  Take 1 tablet (10 mg total) by mouth daily with breakfast. What changed:  when to take this reasons to take this   PROBIOTIC DIGESTIVE SUPPORT PO Take 1 capsule by mouth daily.   rOPINIRole  0.5 MG tablet Commonly known as: REQUIP  Take 1 tablet (0.5 mg total) by mouth at bedtime.   rosuvastatin  10 MG tablet Commonly known as: Crestor  Take 1 tablet (10 mg total) by mouth daily. What changed: when to take this   sertraline  100 MG tablet Commonly known as: ZOLOFT  Take 1 tablet (100  mg total) by mouth daily. What changed: how much to take   traMADol 50 MG tablet Commonly known as: ULTRAM Take 1-2 tablets (50-100 mg total) by mouth every 4 (four) hours as needed for moderate pain (pain score 4-6).   valACYclovir  500 MG tablet Commonly known as: VALTREX  Take 1 tablet (500 mg total) by mouth 3 (three) times daily. What changed:  when to take this reasons to take this               Durable Medical Equipment  (From admission, onward)           Start     Ordered   05/25/24 1101  DME Walker rolling  Once       Question:  Patient needs a walker to treat with the following condition  Answer:  Total knee replacement status   05/25/24 1100   05/25/24 1101  DME Bedside commode  Once       Comments: Patient is not able to walk the distance required to go the bathroom, or he/she is unable to safely negotiate stairs required to access the bathroom.  A 3in1 BSC will alleviate this problem  Question:  Patient needs a bedside commode to treat with the following condition  Answer:  Total knee replacement status   05/25/24 1100            Diagnostic Studies: DG Knee Right Port Result Date: 05/25/2024 CLINICAL DATA:  Total knee replacement. EXAM: PORTABLE RIGHT KNEE - 1-2 VIEW COMPARISON:  12/31/2016. FINDINGS: Right total knee arthroplasty. Surgical drains and skin staples are in place. Subcutaneous air is present. Old external fixator lucencies in the distal femur and proximal tibia. IMPRESSION: Right total knee arthroplasty with expected postoperative findings. Electronically Signed   By: Newell Eke M.D.   On: 05/25/2024 11:51    Disposition: Plan for discharge home pending progress with PT.     Follow-up Information     Kip Lynwood Double, PA-C Follow up on 05/23/2024.   Specialty: Physician Assistant Why: at 2:15pm Contact information: [address] [place] [address] [phone]         Mardee Lynwood SQUIBB, MD Follow up on  06/21/2024.   Specialty: Orthopedic Surgery Why: at 2:45pm Contact information: [address] [facility] [address] [facility] [place] [address] [phone]  Signed: Lynwood LITTIE Level PA-C 05/26/2024, 7:26 AM

## 2024-05-25 NOTE — Op Note (Signed)
 OPERATIVE NOTE  DATE OF SURGERY:  05/25/2024  PATIENT NAME:  Jordan Baker   DOB: 1960-07-20  MRN: [id number]  PRE-OPERATIVE DIAGNOSIS: Degenerative arthrosis of the right knee, primary  POST-OPERATIVE DIAGNOSIS:  Same  PROCEDURE:  Right total knee arthroplasty using computer-assisted navigation  SURGEON:  Lynwood SHAUNNA Mardee Mickey. M.D.  ANESTHESIA: spinal  ESTIMATED BLOOD LOSS: 50 mL  FLUIDS REPLACED: 700 mL of crystalloid  TOURNIQUET TIME: 96 minutes  DRAINS: 2 medium Hemovac drains  SOFT TISSUE RELEASES: Anterior cruciate ligament, posterior cruciate ligament, deep medial collateral ligament, patellofemoral ligament  IMPLANTS UTILIZED: DePuy Attune size 5N posterior stabilized femoral component (cemented), size 4 rotating platform tibial component (cemented), 32 mm medialized dome patella (cemented), and a 5 mm stabilized rotating platform polyethylene insert.  INDICATIONS FOR SURGERY: Jordan Baker is a 63 y.o. year old female with a long history of progressive knee pain. X-rays demonstrated severe degenerative changes in tricompartmental fashion. The patient had not seen any significant improvement despite conservative nonsurgical intervention. After discussion of the risks and benefits of surgical intervention, the patient expressed understanding of the risks benefits and agree with plans for total knee arthroplasty.   The risks, benefits, and alternatives were discussed at length including but not limited to the risks of infection, bleeding, nerve injury, stiffness, blood clots, the need for revision surgery, cardiopulmonary complications, among others, and they were willing to proceed.  PROCEDURE IN DETAIL: The patient was brought into the operating room and, after adequate spinal anesthesia was achieved, a tourniquet was placed on the patient's upper thigh. The patient's knee and leg were cleaned and prepped with alcohol and DuraPrep and draped in the usual sterile fashion.  A timeout was performed as per usual protocol. The lower extremity was exsanguinated using an Esmarch, and the tourniquet was inflated to 300 mmHg. An anterior longitudinal incision was made followed by a standard mid vastus approach. The deep fibers of the medial collateral ligament were elevated in a subperiosteal fashion off of the medial flare of the tibia so as to maintain a continuous soft tissue sleeve. The patella was subluxed laterally and the patellofemoral ligament was incised. Inspection of the knee demonstrated severe degenerative changes with full-thickness loss of articular cartilage. Osteophytes were debrided using a rongeur. Anterior and posterior cruciate ligaments were excised. Two 4.0 mm Schanz pins were inserted in the femur and into the tibia for attachment of the array of trackers used for computer-assisted navigation. Hip center was identified using a circumduction technique. Distal landmarks were mapped using the computer. The distal femur and proximal tibia were mapped using the computer. The distal femoral cutting guide was positioned using computer-assisted navigation so as to achieve a 5 distal valgus cut. The femur was sized and it was felt that a size 5N femoral component was appropriate. A size 5 femoral cutting guide was positioned and the anterior cut was performed and verified using the computer. This was followed by completion of the posterior and chamfer cuts. Femoral cutting guide for the central box was then positioned in the center box cut was performed.  Attention was then directed to the proximal tibia. Medial and lateral menisci were excised. The extramedullary tibial cutting guide was positioned using computer-assisted navigation so as to achieve a 0 varus-valgus alignment and 3 posterior slope. The cut was performed and verified using the computer. The proximal tibia was sized and it was felt that a size 4 tibial tray was appropriate. Tibial and femoral trials were  inserted followed  by insertion of a 5 mm polyethylene insert. This allowed for excellent mediolateral soft tissue balancing both in flexion and in full extension. Finally, the patella was cut and prepared so as to accommodate a 32 mm medialized dome patella. A patella trial was placed and the knee was placed through a range of motion with excellent patellar tracking appreciated. The femoral trial was removed after debridement of posterior osteophytes. The central post-hole for the tibial component was reamed followed by insertion of a keel punch. Tibial trials were then removed. Cut surfaces of bone were irrigated with copious amounts of normal saline using pulsatile lavage and then suctioned dry. Polymethylmethacrylate cement with gentamicin was prepared in the usual fashion using a vacuum mixer. Cement was applied to the cut surface of the proximal tibia as well as along the undersurface of a size 4 rotating platform tibial component. Tibial component was positioned and impacted into place. Excess cement was removed using Personal assistant. Cement was then applied to the cut surfaces of the femur as well as along the posterior flanges of the size 5N femoral component. The femoral component was positioned and impacted into place. Excess cement was removed using Personal assistant. A 5 mm polyethylene trial was inserted and the knee was brought into full extension with steady axial compression applied. Finally, cement was applied to the backside of a 32 mm medialized dome patella and the patellar component was positioned and patellar clamp applied. Excess cement was removed using Personal assistant. After adequate curing of the cement, the tourniquet was deflated after a total tourniquet time of 96 minutes. Hemostasis was achieved using electrocautery. The knee was irrigated with copious amounts of normal saline using pulsatile lavage followed by 450 ml of Surgiphor and then suctioned dry. 20 mL of 1.3% Exparel and 60 mL of  0.25% Marcaine in 40 mL of normal saline was injected along the posterior capsule, medial and lateral gutters, and along the arthrotomy site. A 5 mm stabilized rotating platform polyethylene insert was inserted and the knee was placed through a range of motion with excellent mediolateral soft tissue balancing appreciated and excellent patellar tracking noted. 2 medium drains were placed in the wound bed and brought out through separate stab incisions. The medial parapatellar portion of the incision was reapproximated using interrupted sutures of #1 Vicryl. Subcutaneous tissue was approximated in layers using first #0 Vicryl followed #2-0 Vicryl. The skin was approximated with skin staples. A sterile dressing was applied.  The patient tolerated the procedure well and was transported to the recovery room in stable condition.    Ashrith Sagan P. Cobain Morici, Jr., M.D.

## 2024-05-25 NOTE — Anesthesia Procedure Notes (Signed)
 Spinal  Patient location during procedure: OR Start time: 05/25/2024 7:26 AM End time: 05/25/2024 7:27 AM Reason for block: surgical anesthesia Staffing Performed: resident/CRNA  Resident/CRNA: Niki Manus SAUNDERS, CRNA Performed by: Niki Manus SAUNDERS, CRNA Authorized by: Leavy Ned, MD   Preanesthetic Checklist Completed: patient identified, IV checked, site marked, risks and benefits discussed, surgical consent, monitors and equipment checked, pre-op evaluation and timeout performed Spinal Block Patient position: sitting Prep: ChloraPrep Patient monitoring: heart rate, continuous pulse ox, blood pressure and cardiac monitor Approach: midline Location: L3-4 Injection technique: single-shot Needle Needle type: Introducer and Pencan  Needle gauge: 25 G Needle length: 10 cm Assessment Sensory level: T6 Events: CSF return Additional Notes Negative paresthesia. Negative blood return. Positive free-flowing CSF. Expiration date of kit checked and confirmed. Patient tolerated procedure well, without complications.

## 2024-05-25 NOTE — Progress Notes (Signed)
 Patient is not able to walk the distance required to go the bathroom, or he/she is unable to safely negotiate stairs required to access the bathroom.  A 3in1 BSC will alleviate this problem   Amenda Duclos P. Angie Fava M.D.

## 2024-05-25 NOTE — Anesthesia Preprocedure Evaluation (Signed)
 Anesthesia Evaluation  Patient identified by MRN, date of birth, ID band Patient awake    Reviewed: Allergy & Precautions, H&P , NPO status , Patient's Chart, lab work & pertinent test results  History of Anesthesia Complications Negative for: history of anesthetic complications  Airway Mallampati: III  TM Distance: <3 FB Neck ROM: full    Dental  (+) Chipped   Pulmonary neg pulmonary ROS, neg shortness of breath, sleep apnea and Continuous Positive Airway Pressure Ventilation           Cardiovascular Exercise Tolerance: Good hypertension, (-) angina (-) Past MI and (-) DOE      Neuro/Psych  PSYCHIATRIC DISORDERS Anxiety     negative neurological ROS     GI/Hepatic Neg liver ROS,GERD  Controlled,,  Endo/Other  Hypothyroidism    Renal/GU      Musculoskeletal   Abdominal   Peds  Hematology negative hematology ROS (+)   Anesthesia Other Findings Past Medical History: No date: Anxiety No date: GERD (gastroesophageal reflux disease) No date: Hyperlipidemia No date: Hypothyroidism  Past Surgical History: No date: BIOPSY RECTAL 1990/1993: CESAREAN SECTION     Comment:  x2 05/17/2015: EUS; N/A     Comment:  Procedure: LOWER ENDOSCOPIC ULTRASOUND (EUS);  Surgeon:               Asberry DELENA Coffee, MD;  Location: [facility] ENDOSCOPY;                Service: Gastroenterology;  Laterality: N/A; 06/2017: JOINT REPLACEMENT; Left 1993: OVARIAN CYST SURGERY 06/24/2017: partial knee replacemt; Left  BMI    Body Mass Index:  28.17 kg/m      Reproductive/Obstetrics negative OB ROS                              Anesthesia Physical Anesthesia Plan  ASA: 2  Anesthesia Plan: Spinal and General   Post-op Pain Management:    Induction: Intravenous  PONV Risk Score and Plan: 2 and Ondansetron, Dexamethasone, Propofol  infusion, TIVA and Midazolam  Airway Management Planned: Natural Airway and  Nasal Cannula  Additional Equipment:   Intra-op Plan:   Post-operative Plan:   Informed Consent: I have reviewed the patients History and Physical, chart, labs and discussed the procedure including the risks, benefits and alternatives for the proposed anesthesia with the patient or authorized representative who has indicated his/her understanding and acceptance.     Dental Advisory Given  Plan Discussed with: Anesthesiologist, CRNA and Surgeon  Anesthesia Plan Comments: (Patient reports no bleeding problems and no anticoagulant use.  Plan for spinal with backup GA  Patient consented for risks of anesthesia including but not limited to:  - adverse reactions to medications - damage to eyes, teeth, lips or other oral mucosa - nerve damage due to positioning  - risk of bleeding, infection and or nerve damage from spinal that could lead to paralysis - risk of headache or failed spinal - damage to teeth, lips or other oral mucosa - sore throat or hoarseness - damage to heart, brain, nerves, lungs, other parts of body or loss of life  Patient voiced understanding and assent.)        Anesthesia Quick Evaluation

## 2024-05-25 NOTE — Evaluation (Signed)
 Physical Therapy Evaluation Patient Details Name: Jordan Baker MRN: [id number] DOB: [birth year] Today's Date: 05/25/2024  History of Present Illness  Pt is a 63 y/o F s/p elective R TKA on 05/25/24. PMH significant for anxiety, GERD, HLD, hypothyroidism, sleep apnea, HTN.  Clinical Impression  Pt A&Ox4, pleasant and agreeable to participate in PT evaluation. At baseline, pt is IND with mobility/ADLs, denies hx of falls or AD use. Pt was received in bed, able to perform bed mobility modI this date with use of bed features. Pt performed STS transfers from EOB and elevated toilet with CGA, min VC for hand placement on RW for transfer. Pt amb ~172ft with CGA, initially poor WB acceptance and knee flexion in swing on RLE, able to progress to more natural gait mechanics with increased amb distance and education on WBAT precautions, no LOB or buckling throughout. Pt was left semi-reclined in bed at end of session with all needs in reach. Pt would benefit from skilled PT intervention to address listed deficits (see PT Problem List) and allow for safe return to PLOF.         If plan is discharge home, recommend the following: A little help with walking and/or transfers;A little help with bathing/dressing/bathroom;Assistance with cooking/housework;Assist for transportation;Help with stairs or ramp for entrance   Can travel by private vehicle        Equipment Recommendations Rolling walker (2 wheels)  Recommendations for Other Services       Functional Status Assessment Patient has had a recent decline in their functional status and demonstrates the ability to make significant improvements in function in a reasonable and predictable amount of time.     Precautions / Restrictions Precautions Precautions: Knee Precaution Booklet Issued: Yes (comment) Restrictions Weight Bearing Restrictions Per Provider Order: Yes RLE Weight Bearing Per Provider Order: Weight bearing as tolerated       Mobility  Bed Mobility Overal bed mobility: Modified Independent             General bed mobility comments: No physical assistance required for supine <> sit with HOB elevated    Transfers Overall transfer level: Needs assistance Equipment used: Rolling walker (2 wheels) Transfers: Sit to/from Stand Sit to Stand: Contact guard assist           General transfer comment: STS from EOB and elevated toilet with CGA for safety. VC for hand placement on RW, good eccentric control to sitting    Ambulation/Gait Ambulation/Gait assistance: Contact guard assist Gait Distance (Feet): 100 Feet Assistive device: Rolling walker (2 wheels) Gait Pattern/deviations: Step-through pattern, Decreased stance time - right, Decreased weight shift to right, Narrow base of support Gait velocity: decreased     General Gait Details: No LOB throughout, initially displayed poor WB acceptance on RLE, progressed with increased ambulation distance and cues for WBAT. Moderate decreased stance time on RLE throughout and decreased R knee flexion in swing. VC for RW positioning to maintain contact with ground  Stairs            Wheelchair Mobility     Tilt Bed    Modified Rankin (Stroke Patients Only)       Balance Overall balance assessment: Needs assistance Sitting-balance support: Feet supported Sitting balance-Leahy Scale: Good Sitting balance - Comments: steady static and dynamic sitting   Standing balance support: Bilateral upper extremity supported, No upper extremity supported Standing balance-Leahy Scale: Fair Standing balance comment: able to stand and wash hands at sink with fair balance, mild increased postural  sway with no UE support                             Pertinent Vitals/Pain Pain Assessment Pain Assessment: 0-10 Pain Score: 4  Pain Location: R knee Pain Descriptors / Indicators: Discomfort, Sore, Operative site guarding Pain Intervention(s):  Monitored during session, Repositioned    Home Living Family/patient expects to be discharged to:: Private residence Living Arrangements: Spouse/significant other Available Help at Discharge: Family;Available 24 hours/day Type of Home: House Home Access: Stairs to enter Entrance Stairs-Rails: Doctor, General Practice of Steps: 4   Home Layout: One level Home Equipment: Rollator (4 wheels);BSC/3in1;Shower seat - built in      Prior Function Prior Level of Function : Independent/Modified Independent;Driving             Mobility Comments: Pt reports being IND with mobility, no previous AD use or hx of falls ADLs Comments: IND with ADLs, retired     Extremity/Trunk Assessment   Upper Extremity Assessment Upper Extremity Assessment: Overall WFL for tasks assessed    Lower Extremity Assessment Lower Extremity Assessment: Generalized weakness RLE Deficits / Details: RLE ace-wrapped, able to perform active SLR, sensation WFL       Communication   Communication Communication: No apparent difficulties    Cognition Arousal: Alert Behavior During Therapy: WFL for tasks assessed/performed   PT - Cognitive impairments: No apparent impairments                       PT - Cognition Comments: A&Ox4, pleasant and cooperative Following commands: Intact       Cueing Cueing Techniques: Verbal cues, Visual cues     General Comments General comments (skin integrity, edema, etc.): RLE wrappings/drain intact throughout session    Exercises Total Joint Exercises Quad Sets: AROM, Right, 10 reps, Supine Knee Flexion: AROM, Right, 10 reps, Seated Goniometric ROM: RLE AROM 7-63 degrees   Assessment/Plan    PT Assessment Patient needs continued PT services  PT Problem List Decreased strength;Decreased range of motion;Decreased activity tolerance;Decreased balance;Decreased mobility;Decreased knowledge of use of DME;Pain       PT Treatment Interventions DME  instruction;Gait training;Stair training;Functional mobility training;Therapeutic activities;Therapeutic exercise;Balance training;Patient/family education    PT Goals (Current goals can be found in the Care Plan section)  Acute Rehab PT Goals Patient Stated Goal: to go home PT Goal Formulation: With patient Time For Goal Achievement: 06/08/24 Potential to Achieve Goals: Good    Frequency BID     Co-evaluation               AM-PAC PT 6 Clicks Mobility  Outcome Measure Help needed turning from your back to your side while in a flat bed without using bedrails?: None Help needed moving from lying on your back to sitting on the side of a flat bed without using bedrails?: A Little Help needed moving to and from a bed to a chair (including a wheelchair)?: A Little Help needed standing up from a chair using your arms (e.g., wheelchair or bedside chair)?: A Little Help needed to walk in hospital room?: A Little Help needed climbing 3-5 steps with a railing? : A Little 6 Click Score: 19    End of Session   Activity Tolerance: Patient tolerated treatment well Patient left: in bed;with call bell/phone within reach;with family/visitor present;with SCD's reapplied Nurse Communication: Mobility status PT Visit Diagnosis: Other abnormalities of gait and mobility (R26.89);Muscle weakness (generalized) (  M62.81);Pain Pain - Right/Left: Right Pain - part of body: Knee    Time: 8565-8542 PT Time Calculation (min) (ACUTE ONLY): 23 min   Charges:   PT Evaluation $PT Eval Low Complexity: 1 Low PT Treatments $Gait Training: 8-22 mins PT General Charges $$ ACUTE PT VISIT: 1 Visit         Janell Axe, SPT

## 2024-05-25 NOTE — Plan of Care (Signed)
 Verbalizes understanding of procedure, plan of care and discharge instructions

## 2024-05-25 NOTE — Interval H&P Note (Signed)
 History and Physical Interval Note:  05/25/2024 6:10 AM  Jordan Baker  has presented today for surgery, with the diagnosis of Primary osteoarthritis of right knee.  The various methods of treatment have been discussed with the patient and family. After consideration of risks, benefits and other options for treatment, the patient has consented to  Procedure(s): ARTHROPLASTY, KNEE, TOTAL, USING IMAGELESS COMPUTER-ASSISTED NAVIGATION (Right) as a surgical intervention.  The patient's history has been reviewed, patient examined, no change in status, stable for surgery.  I have reviewed the patient's chart and labs.  Questions were answered to the patient's satisfaction.     [name] P Dyson Sevey

## 2024-05-26 ENCOUNTER — Other Ambulatory Visit: Payer: Self-pay

## 2024-05-26 ENCOUNTER — Encounter: Payer: Self-pay | Admitting: Orthopedic Surgery

## 2024-05-26 DIAGNOSIS — M1711 Unilateral primary osteoarthritis, right knee: Secondary | ICD-10-CM | POA: Diagnosis not present

## 2024-05-26 MED ORDER — CELECOXIB 200 MG PO CAPS
200.0000 mg | ORAL_CAPSULE | Freq: Two times a day (BID) | ORAL | 0 refills | Status: AC
  Filled 2024-05-26: qty 30, 15d supply, fill #0

## 2024-05-26 MED ORDER — ASPIRIN 81 MG PO CHEW: 81.0000 mg | CHEWABLE_TABLET | Freq: Two times a day (BID) | ORAL | Status: AC

## 2024-05-26 MED ORDER — OXYCODONE HCL 5 MG PO TABS
5.0000 mg | ORAL_TABLET | ORAL | 0 refills | Status: AC | PRN
  Filled 2024-05-26: qty 30, 5d supply, fill #0

## 2024-05-26 MED ORDER — CHLORHEXIDINE GLUCONATE 4 % EX SOLN
1.0000 | CUTANEOUS | 1 refills | Status: AC
  Filled 2024-05-26: qty 118, 5d supply, fill #0

## 2024-05-26 MED ORDER — TRAMADOL HCL 50 MG PO TABS
50.0000 mg | ORAL_TABLET | ORAL | 0 refills | Status: AC | PRN
  Filled 2024-05-26: qty 30, 4d supply, fill #0

## 2024-05-26 MED ORDER — MUPIROCIN 2 % EX OINT
1.0000 | TOPICAL_OINTMENT | Freq: Two times a day (BID) | CUTANEOUS | 0 refills | Status: AC
  Filled 2024-05-26: qty 22, 11d supply, fill #0

## 2024-05-26 NOTE — Evaluation (Signed)
 Occupational Therapy Evaluation Patient Details Name: Jordan Baker MRN: [id number] DOB: 28-Apr-1961 Today's Date: 05/26/2024   History of Present Illness   Pt is a 63 y/o F s/p elective R TKA on 05/25/24. PMH significant for anxiety, GERD, HLD, hypothyroidism, sleep apnea, HTN.     Clinical Impressions Patient was seen for OT evaluation this date. Patient seen POD 1 s/p R TKA. Patient presented in bed supine, able to exit bed without A; reported pain 4/10. Patient has good support from spouse who is able and willing to assist as needed, patient ambulating with r/w with supervision. Pt instructed in polar care mgt, falls prevention strategies, home/routines modifications, DME/AE for LB bathing/dressing tasks, and compression stocking mgt. All education complete, will sign off.       If plan is discharge home, recommend the following:   A little help with walking and/or transfers     Functional Status Assessment   Patient has had a recent decline in their functional status and demonstrates the ability to make significant improvements in function in a reasonable and predictable amount of time.     Equipment Recommendations   None recommended by OT     Recommendations for Other Services         Precautions/Restrictions   Precautions Precautions: Knee Precaution Booklet Issued: Yes (comment) Recall of Precautions/Restrictions: Intact Restrictions Weight Bearing Restrictions Per Provider Order: Yes RLE Weight Bearing Per Provider Order: Weight bearing as tolerated     Mobility Bed Mobility Overal bed mobility: Modified Independent                  Transfers Overall transfer level: Needs assistance Equipment used: Rolling walker (2 wheels) Transfers: Sit to/from Stand Sit to Stand: Supervision                  Balance Overall balance assessment: Needs assistance Sitting-balance support: Feet supported Sitting balance-Leahy Scale:  Good Sitting balance - Comments: steady static and dynamic sitting   Standing balance support: Bilateral upper extremity supported, No upper extremity supported Standing balance-Leahy Scale: Good                             ADL either performed or assessed with clinical judgement   ADL Overall ADL's : Needs assistance/impaired     Grooming: Wash/dry hands;Supervision/safety;Standing           Upper Body Dressing : Set up   Lower Body Dressing: Supervision/safety;Set up;Sit to/from stand   Toilet Transfer: Supervision/safety;Rolling walker (2 wheels);BSC/3in1   Toileting- Architect and Hygiene: Supervision/safety;Sit to/from stand         General ADL Comments: was able to dress herself without A, cues for sequencing/compensatory techniques, transfers with supervision     Vision         Perception         Praxis         Pertinent Vitals/Pain Pain Assessment Pain Assessment: 0-10 Pain Score: 3  Pain Location: R knee Pain Descriptors / Indicators: Discomfort, Sore, Operative site guarding Pain Intervention(s): Limited activity within patient's tolerance     Extremity/Trunk Assessment Upper Extremity Assessment Upper Extremity Assessment: Overall WFL for tasks assessed   Lower Extremity Assessment Lower Extremity Assessment: Generalized weakness       Communication Communication Communication: No apparent difficulties   Cognition Arousal: Alert Behavior During Therapy: WFL for tasks assessed/performed Cognition: No apparent impairments  Following commands: Intact       Cueing  General Comments   Cueing Techniques: Verbal cues;Visual cues      Exercises     Shoulder Instructions      Home Living Family/patient expects to be discharged to:: Private residence Living Arrangements: Spouse/significant other Available Help at Discharge: Family;Available 24 hours/day Type of  Home: House Home Access: Stairs to enter [employer] of Steps: 4 Entrance Stairs-Rails: Right;Left Home Layout: One level     Bathroom Shower/Tub: Producer, Television/film/video: Standard Bathroom Accessibility: Yes How Accessible: Accessible via walker Home Equipment: Rollator (4 wheels);BSC/3in1;Shower seat - built in;Grab bars - tub/shower;Hand held shower head          Prior Functioning/Environment Prior Level of Function : Independent/Modified Independent;Driving             Mobility Comments: Pt reports being IND with mobility, no previous AD use or hx of falls ADLs Comments: IND with ADLs, retired    OT Problem List: Decreased strength;Decreased activity tolerance   OT Treatment/Interventions:        OT Goals(Current goals can be found in the care plan section)   Acute Rehab OT Goals Patient Stated Goal: to go home OT Goal Formulation: With patient   OT Frequency:       Co-evaluation              AM-PAC OT 6 Clicks Daily Activity     Outcome Measure Help from another person eating meals?: None Help from another person taking care of personal grooming?: None Help from another person toileting, which includes using toliet, bedpan, or urinal?: None Help from another person bathing (including washing, rinsing, drying)?: None Help from another person to put on and taking off regular upper body clothing?: None Help from another person to put on and taking off regular lower body clothing?: None 6 Click Score: 24   End of Session Equipment Utilized During Treatment: Rolling walker (2 wheels) Nurse Communication: Mobility status  Activity Tolerance: Patient tolerated treatment well Patient left: in chair;with call bell/phone within reach  OT Visit Diagnosis: Unsteadiness on feet (R26.81)                Time: 9250-9182 OT Time Calculation (min): 28 min Charges:  OT General Charges $OT Visit: 1 Visit OT Evaluation $OT Eval Low  Complexity: 1 Low OT Treatments $Self Care/Home Management : 23-37 mins  Rogers Clause, OT/L [facility], 05/26/2024

## 2024-05-26 NOTE — Progress Notes (Signed)
 Physical Therapy Treatment Patient Details Name: Jordan Baker MRN: [id number] DOB: [birth year] Today's Date: 05/26/2024   History of Present Illness Pt is a 63 y/o F s/p elective R TKA on 05/25/24. PMH significant for anxiety, GERD, HLD, hypothyroidism, sleep apnea, HTN.    PT Comments  Pt was seated, dressed, in recliner upon arrival. She is A and O x 4 and agreeable to PT session. She demonstrated safe abilities to stand and sit with good eccentric control. Pt tolerated ambulation > 200 ft with RW. Safely performed ascending/descending stairs to simulate home entry & exit. Once back in room, pt demonstrated AAROM R knee flexion to 92 degrees. Currently only lacking 2 degrees of full knee extension. Encouraged continued use of towel roll/bone foam for HS stretching. She states understanding and is progressing well overall. Pt is cleared from an acute PT standpoint for safe DC home with HHPT to follow.    If plan is discharge home, recommend the following: A little help with walking and/or transfers;A little help with bathing/dressing/bathroom;Assistance with cooking/housework;Assist for transportation;Help with stairs or ramp for entrance     Equipment Recommendations  None recommended by PT       Precautions / Restrictions Precautions Precautions: Knee Precaution Booklet Issued: Yes (comment) Recall of Precautions/Restrictions: Intact Restrictions Weight Bearing Restrictions Per Provider Order: Yes RLE Weight Bearing Per Provider Order: Weight bearing as tolerated     Mobility  Bed Mobility Overal bed mobility: Modified Independent   Transfers Overall transfer level: Modified independent Equipment used: Rolling walker (2 wheels) Transfers: Sit to/from Stand Sit to Stand: Modified independent (Device/Increase time)  General transfer comment: no physical assistance. pt demonstrated proper technique with standing and sitting    Ambulation/Gait Ambulation/Gait assistance:  Supervision Gait Distance (Feet): 200 Feet Assistive device: Rolling walker (2 wheels) Gait Pattern/deviations: Step-through pattern Gait velocity: decreased  General Gait Details: pt demonstrated safe ambulation with use or RW. No LOB or safety concerns. Vcs for heel strike to toe off sequencing   Balance Overall balance assessment: Needs assistance Sitting-balance support: Feet supported Sitting balance-Leahy Scale: Good     Standing balance support: Bilateral upper extremity supported, During functional activity, Reliant on assistive device for balance Standing balance-Leahy Scale: Good       Communication Communication Communication: No apparent difficulties  Cognition Arousal: Alert Behavior During Therapy: WFL for tasks assessed/performed   PT - Cognitive impairments: No apparent impairments     PT - Cognition Comments: A&Ox4, pleasant and cooperative Following commands: Intact      Cueing Cueing Techniques: Verbal cues, Tactile cues  Exercises Total Joint Exercises Goniometric ROM: RLE knee AAROM 2-92    General Comments General comments (skin integrity, edema, etc.): Reviewed HEP, performed AAROM knee flexion, reviewed polar care, care transfers and overall importance of routine mobility      Pertinent Vitals/Pain Pain Assessment Pain Assessment: 0-10 Pain Score: 2  Pain Location: R knee Pain Descriptors / Indicators: Discomfort, Sore, Operative site guarding Pain Intervention(s): Limited activity within patient's tolerance, Monitored during session, Premedicated before session, Repositioned    Home Living Family/patient expects to be discharged to:: Private residence Living Arrangements: Spouse/significant other Available Help at Discharge: Family;Available 24 hours/day Type of Home: House Home Access: Stairs to enter Entrance Stairs-Rails: Doctor, General [employer] of Steps: 4   Home Layout: One level Home Equipment: Rollator (4  wheels);BSC/3in1;Shower seat - built in;Grab bars - tub/shower;Hand held shower head          PT Goals (current goals can  now be found in the care plan section) Acute Rehab PT Goals Patient Stated Goal: to go home Progress towards PT goals: Progressing toward goals    Frequency    BID       AM-PAC PT 6 Clicks Mobility   Outcome Measure  Help needed turning from your back to your side while in a flat bed without using bedrails?: None Help needed moving from lying on your back to sitting on the side of a flat bed without using bedrails?: None Help needed moving to and from a bed to a chair (including a wheelchair)?: None Help needed standing up from a chair using your arms (e.g., wheelchair or bedside chair)?: None Help needed to walk in hospital room?: None Help needed climbing 3-5 steps with a railing? : A Little 6 Click Score: 23    End of Session   Activity Tolerance: Patient tolerated treatment well Patient left: in chair;with call bell/phone within reach;with chair alarm set Nurse Communication: Mobility status PT Visit Diagnosis: Other abnormalities of gait and mobility (R26.89);Muscle weakness (generalized) (M62.81);Pain Pain - Right/Left: Right Pain - part of body: Knee     Time: 9167-9155 PT Time Calculation (min) (ACUTE ONLY): 12 min  Charges:    $Gait Training: 8-22 mins PT General Charges $$ ACUTE PT VISIT: 1 Visit                     Rankin Essex PTA 05/26/24, 9:08 AM

## 2024-05-26 NOTE — Plan of Care (Signed)
   Problem: Activity: Goal: Ability to avoid complications of mobility impairment will improve Outcome: Progressing   Problem: Pain Management: Goal: Pain level will decrease with appropriate interventions Outcome: Progressing

## 2024-05-26 NOTE — Progress Notes (Signed)
 DISCHARGE NOTE:  Pt given discharge instructions and verbalized understanding. TED hose on both legs. 2 honeycomb dressing, walker and Meds to bed medications sent with pt. Pt wheeled to car by staff, husband providing transportation home.

## 2024-05-26 NOTE — Anesthesia Postprocedure Evaluation (Signed)
 Anesthesia Post Note  Patient: Jordan Baker  Procedure(s) Performed: ARTHROPLASTY, KNEE, TOTAL, USING IMAGELESS COMPUTER-ASSISTED NAVIGATION (Right: Knee)  Patient location during evaluation: Nursing Unit Anesthesia Type: Spinal Level of consciousness: awake and alert Pain management: pain level controlled Vital Signs Assessment: post-procedure vital signs reviewed and stable Respiratory status: spontaneous breathing, nonlabored ventilation, respiratory function stable and patient connected to nasal cannula oxygen Cardiovascular status: blood pressure returned to baseline and stable Postop Assessment: no apparent nausea or vomiting Anesthetic complications: no   No notable events documented.   Last Vitals:  Vitals:   05/26/24 0502 05/26/24 0831  BP: (!) 119/56 135/70  Pulse: 76   Resp: 15 16  Temp: 36.8 C 37.1 C  SpO2: 97% 97%    Last Pain:  Vitals:   05/26/24 0921  TempSrc:   PainSc: 4                  Debby Mines

## 2024-05-26 NOTE — Plan of Care (Signed)
  Problem: Activity: Goal: Ability to avoid complications of mobility impairment will improve Outcome: Progressing Goal: Range of joint motion will improve Outcome: Progressing   Problem: Pain Management: Goal: Pain level will decrease with appropriate interventions Outcome: Progressing   

## 2024-05-26 NOTE — Progress Notes (Signed)
  Subjective: 1 Day Post-Op Procedure(s) (LRB): ARTHROPLASTY, KNEE, TOTAL, USING IMAGELESS COMPUTER-ASSISTED NAVIGATION (Right) Patient reports pain as mild.   Patient is well, and has had no acute complaints or problems Plan is to go Home after hospital stay. Negative for chest pain and shortness of breath Fever: no Gastrointestinal:Negative for nausea and vomiting Reports she is passing gas this morning.  Has been able to get up and urinate as well.  Objective: Vital signs in last 24 hours: Temp:  [97.2 F (36.2 C)-98.2 F (36.8 C)] 98.2 F (36.8 C) (11/13 0502) Pulse Rate:  [67-98] 76 (11/13 0502) Resp:  [12-16] 15 (11/13 0502) BP: (95-132)/(56-88) 119/56 (11/13 0502) SpO2:  [95 %-99 %] 97 % (11/13 0502)  Intake/Output from previous day:  Intake/Output Summary (Last 24 hours) at 05/26/2024 0720 Last data filed at 05/26/2024 0541 Gross per 24 hour  Intake 3031.74 ml  Output 600 ml  Net 2431.74 ml    Intake/Output this shift: No intake/output data recorded.  Labs: No results for input(s): HGB in the last 72 hours. No results for input(s): WBC, RBC, HCT, PLT in the last 72 hours. No results for input(s): NA, K, CL, CO2, BUN, CREATININE, GLUCOSE, CALCIUM  in the last 72 hours. No results for input(s): LABPT, INR in the last 72 hours.   EXAM General - Patient is Alert, Appropriate, and Oriented Extremity - ABD soft Neurovascular intact Dorsiflexion/Plantar flexion intact No cellulitis present Compartment soft Dressing/Incision - Bulky dressing was removed this morning.  [place] without any drainage noted.  Hemovac removed without issue.  4x4 with tegaderm applied over the drain site. Motor Function - intact, moving foot and toes well on exam.  Abdomen soft with intact bowel sounds this AM.  Past Medical History:  Diagnosis Date   Actinic keratosis 10/26/2009   Right upper arm.   Anxiety    a.) on scheduled SSRI (sertraline ) + PRN  BZO (alprazolam )   Basal cell carcinoma 07/12/2013   Right nasal dorsum. Nodular pattern.   Dysplastic nevus 11/05/2007   Left thigh. Moderate atypia. Close to surgical margin.   GERD (gastroesophageal reflux disease)    Hyperlipidemia    Hypothyroidism    OSA on CPAP    Prediabetes    Ramsay Hunt syndrome (geniculate herpes zoster) 2015   a.) initally Dx'd in 2015 with (+) RIGHT eye involvement; (+) recurrent episodes for which she uses valacyclovir  PRN for outbreaks   RLS (restless legs syndrome)    a.) on ropinirole     Assessment/Plan: 1 Day Post-Op Procedure(s) (LRB): ARTHROPLASTY, KNEE, TOTAL, USING IMAGELESS COMPUTER-ASSISTED NAVIGATION (Right) Principal Problem:   History of total knee arthroplasty, right  Estimated body mass index is 30.91 kg/m as calculated from the following:   Height as of this encounter: 5' 2 (1.575 m).   Weight as of this encounter: 76.7 kg. Advance diet Up with therapy D/C IV fluids when tolerating po intake.  Vitals reviewed this AM. Bulky dressing removed, hemovac removed without issue.  4x4 with tegaderm applied.   Up with therapy today.  Patient is urinating well.  Passing gas. Plan for d/c home today pending progress with PT.  DVT Prophylaxis - Aspirin and TED hose Weight-Bearing as tolerated to right leg  J. Gustavo Level, PA-C [facility] 05/26/2024, 7:20 AM

## 2024-05-27 ENCOUNTER — Telehealth: Payer: Self-pay

## 2024-05-27 NOTE — Telephone Encounter (Signed)
 Copied from CRM #[id number]. Topic: General - Other >> May 27, 2024 11:47 AM Amy B wrote: Reason for CRM: Received call from [facility].  They called to let provider know that they will be caring for her in her home.  If you have any questions, please call [phone]

## 2024-05-30 NOTE — Telephone Encounter (Signed)
 PT Telephone encounter reviewed.

## 2024-06-06 ENCOUNTER — Inpatient Hospital Stay: Admission: RE | Admit: 2024-06-06 | Source: Ambulatory Visit

## 2024-06-13 DIAGNOSIS — R7303 Prediabetes: Secondary | ICD-10-CM

## 2024-06-13 DIAGNOSIS — L409 Psoriasis, unspecified: Secondary | ICD-10-CM

## 2024-06-13 DIAGNOSIS — M350C Sjogren syndrome with dental involvement: Secondary | ICD-10-CM

## 2024-06-13 DIAGNOSIS — M1711 Unilateral primary osteoarthritis, right knee: Secondary | ICD-10-CM

## 2024-06-13 DIAGNOSIS — Z880 Allergy status to penicillin: Secondary | ICD-10-CM

## 2024-07-04 ENCOUNTER — Ambulatory Visit: Admitting: Family Medicine

## 2024-07-04 ENCOUNTER — Encounter: Payer: Self-pay | Admitting: Family Medicine

## 2024-07-04 VITALS — BP 137/65 | HR 103 | Temp 98.4°F | Ht 62.0 in | Wt 172.4 lb

## 2024-07-04 DIAGNOSIS — G1119 Other early-onset cerebellar ataxia: Secondary | ICD-10-CM | POA: Diagnosis not present

## 2024-07-04 DIAGNOSIS — I10 Essential (primary) hypertension: Secondary | ICD-10-CM | POA: Diagnosis not present

## 2024-07-04 DIAGNOSIS — Z23 Encounter for immunization: Secondary | ICD-10-CM | POA: Diagnosis not present

## 2024-07-04 DIAGNOSIS — Z0001 Encounter for general adult medical examination with abnormal findings: Secondary | ICD-10-CM | POA: Diagnosis not present

## 2024-07-04 DIAGNOSIS — M350C Sjogren syndrome with dental involvement: Secondary | ICD-10-CM

## 2024-07-04 DIAGNOSIS — E7800 Pure hypercholesterolemia, unspecified: Secondary | ICD-10-CM | POA: Diagnosis not present

## 2024-07-04 DIAGNOSIS — R7303 Prediabetes: Secondary | ICD-10-CM

## 2024-07-04 DIAGNOSIS — F5101 Primary insomnia: Secondary | ICD-10-CM

## 2024-07-04 DIAGNOSIS — G4733 Obstructive sleep apnea (adult) (pediatric): Secondary | ICD-10-CM

## 2024-07-04 DIAGNOSIS — E034 Atrophy of thyroid (acquired): Secondary | ICD-10-CM

## 2024-07-04 DIAGNOSIS — Z0000 Encounter for general adult medical examination without abnormal findings: Secondary | ICD-10-CM

## 2024-07-04 DIAGNOSIS — G2581 Restless legs syndrome: Secondary | ICD-10-CM | POA: Diagnosis not present

## 2024-07-04 DIAGNOSIS — R000 Tachycardia, unspecified: Secondary | ICD-10-CM | POA: Diagnosis not present

## 2024-07-04 MED ORDER — GABAPENTIN 100 MG PO CAPS: ORAL_CAPSULE | ORAL | 2 refills | Status: AC

## 2024-07-04 MED ORDER — DOXEPIN HCL 10 MG PO CAPS: 150.0000 mg | ORAL_CAPSULE | Freq: Every day | ORAL | 1 refills | Status: AC

## 2024-07-04 NOTE — Patient Instructions (Signed)
 To keep you healthy, please keep in mind the following health maintenance items that you are due for:   Health Maintenance Due  Topic Date Due   Zoster Vaccines- Shingrix (2 of 2) 08/23/2021   Influenza Vaccine  02/12/2024     Best Wishes,   Dr. Lang

## 2024-07-04 NOTE — Progress Notes (Signed)
 "  Complete physical exam  Patient: Jordan Baker [facility]    DOB: [birth year] 63 y.o.   MRN: [id number]  Chief Complaint  Patient presents with   Annual Exam    Patient is present for CPE with PCP Diet is normal per patient. Not currently exercising outside of physical therapy, finished today.     Subjective:    Jordan Baker is a 63 y.o. female who presents today for a complete physical exam.  Discussed the use of AI scribe software for clinical note transcription with the patient, who gave verbal consent to proceed.  History of Present Illness Jordan Baker is a 63 year old female who presents for an annual physical exam.  She underwent a knee replacement on November 12th and has completed physical therapy. She reports doing very well after her knee replacement, is no longer limping, and feels she is able to walk better. Pain management included oxycodone  for severe pain and tramadol  for mild pain, which she took before physical therapy sessions. Gabapentin  has been used for nerve pain, particularly post-surgery, with a usual dose of 100 mg at night, increased during the day if needed.  She experiences sleep disturbances, waking every three hours, and uses Xanax  to aid sleep, taking half a tablet before bed and another half upon waking. She uses a CPAP machine for obstructive sleep apnea and appreciates the new machine she recently received. She reports waking with a heart rate of 89 bpm, which feels faster than usual.  She has a history of hypothyroidism with recent TSH levels reported as low (0.3). Her TSH was previously as low as 0.1. She has a history of hyperlipidemia and prediabetes. Her A1c was 5.4 two months ago, and she is due for an updated lipid panel. She experiences occasional ankle swelling, which resolves by morning.   Most recent fall risk assessment:    07/04/2024    3:40 PM  Fall Risk   Falls in the past year? 1  Number falls in past yr: 1  Injury with  Fall? 0  Risk for fall due to : No Fall Risks  Follow up Falls evaluation completed     Most recent depression screenings:    07/04/2024    3:40 PM 01/25/2024    1:48 PM  PHQ 2/9 Scores  PHQ - 2 Score 0 0  PHQ- 9 Score 3 3      Data saved with a previous flowsheet row definition    Vision:Within last year and Dental: No current dental problems Patient Active Problem List   Diagnosis Date Noted   Primary insomnia 07/04/2024   History of total knee arthroplasty, right 05/25/2024   OSA on CPAP 05/16/2024   Restless legs syndrome (RLS) 01/25/2024   Psoriasis 01/25/2024   Primary osteoarthritis of right knee 01/10/2024   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   Personal history of colon polyps, unspecified 07/03/2023   Prediabetes 07/03/2023   Panic disorder with agoraphobia and moderate panic attacks 03/31/2023   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   Anxiety    a.) on scheduled SSRI (sertraline ) + PRN BZO (alprazolam )   Arthritis    Both knees   Basal cell carcinoma 07/12/2013   Right nasal dorsum. Nodular pattern.   Blood transfusion without reported diagnosis    Dysplastic nevus 11/05/2007   Left thigh. Moderate atypia. Close to surgical margin.   GERD (gastroesophageal reflux disease)    Hyperlipidemia    Hypertension    Hypothyroidism    OSA on CPAP    Prediabetes    Ramsay Hunt syndrome (geniculate herpes zoster) 2015   a.) initally  Dx'd in 2015 with (+) RIGHT eye involvement; (+) recurrent episodes for which she uses valacyclovir  PRN for outbreaks   RLS (restless legs syndrome)    a.) on ropinirole    Sleep apnea       Patient Care Team: Sharma Coyer, MD as PCP - General (Family Medicine)   ROS    Objective:    BP 137/65 (BP Location: Right Arm, Patient Position: Sitting, Cuff Size: Normal)   Pulse (!) 103   Temp 98.4 F (36.9 C) (Oral)   Ht 5' 2 (1.575 m)   Wt 172 lb 6.4 oz (78.2 kg)   LMP 06/20/2018 Comment: no urine pregnancy needed per Dr. Stevan  SpO2 99%   BMI 31.53 kg/m   BP Readings from Last 3 Encounters:  07/04/24 137/65  05/26/24 114/69  05/16/24 134/67   Wt Readings from Last 3 Encounters:  07/04/24 172 lb 6.4 oz (78.2 kg)  05/25/24 169 lb (76.7 kg)  05/03/24 172 lb (78 kg)      Physical Exam Vitals reviewed.  Constitutional:      General: She is not in acute distress.    Appearance: Normal appearance. She is not ill-appearing, toxic-appearing or diaphoretic.  HENT:     Head: Normocephalic and atraumatic.     Right Ear: Tympanic membrane and external ear normal. There is no impacted cerumen.     Left Ear: Tympanic membrane and external ear normal. There is no impacted cerumen.     Nose: Nose normal.     Mouth/Throat:     Pharynx: Oropharynx is clear.  Eyes:     General: No scleral icterus.    Extraocular Movements: Extraocular movements intact.     Conjunctiva/sclera: Conjunctivae normal.     Pupils: Pupils are equal, round, and reactive to light.  Cardiovascular:     Rate and Rhythm: Normal rate and regular rhythm.     Pulses: Normal pulses.     Heart sounds: Normal heart sounds. No murmur heard.    No friction rub. No gallop.  Pulmonary:     Effort: Pulmonary effort is normal. No respiratory distress.     Breath sounds: Normal breath sounds. No wheezing, rhonchi or rales.  Abdominal:     General: Bowel sounds are normal. There is no distension.      Palpations: Abdomen is soft. There is no mass.     Tenderness: There is no abdominal tenderness. There is no guarding.  Musculoskeletal:        General: No deformity.     Cervical back: Normal range of motion and neck supple.     Right lower leg: No edema.     Left lower leg: No edema.  Lymphadenopathy:     Cervical: No cervical adenopathy.  Skin:    General: Skin is warm.     Capillary Refill: Capillary refill takes less than 2  seconds.     Findings: No erythema or rash.  Neurological:     General: No focal deficit present.     Mental Status: She is alert and oriented to person, place, and time.     Cranial Nerves: Cranial nerves 2-12 are intact. No cranial nerve deficit or facial asymmetry.     Motor: Motor function is intact. No weakness.     Gait: Gait normal.  Psychiatric:        Mood and Affect: Mood normal.        Behavior: Behavior normal.       No results found for any visits on 07/04/24. Last CBC Lab Results  Component Value Date   WBC 5.9 05/03/2024   HGB 13.0 05/03/2024   HCT 38.6 05/03/2024   MCV 90.0 05/03/2024   MCH 30.3 05/03/2024   RDW 12.5 05/03/2024   PLT 287 05/03/2024   Last metabolic panel Lab Results  Component Value Date   GLUCOSE 92 05/03/2024   NA 142 05/03/2024   K 3.7 05/03/2024   CL 107 05/03/2024   CO2 26 05/03/2024   BUN 17 05/03/2024   CREATININE 0.64 05/03/2024   GFRNONAA >60 05/03/2024   CALCIUM  9.4 05/03/2024   PROT 7.8 05/03/2024   ALBUMIN 4.2 05/03/2024   LABGLOB 2.1 06/26/2023   AGRATIO 2.0 06/17/2022   BILITOT 0.3 05/03/2024   ALKPHOS 71 05/03/2024   AST 25 05/03/2024   ALT 22 05/03/2024   ANIONGAP 9 05/03/2024   Last lipids Lab Results  Component Value Date   CHOL 191 06/26/2023   HDL 58 06/26/2023   LDLCALC 107 (H) 06/26/2023   TRIG 146 06/26/2023   CHOLHDL 3.3 06/26/2023   Last hemoglobin A1c Lab Results  Component Value Date   HGBA1C 5.4 05/03/2024   Last thyroid  functions Lab Results  Component  Value Date   TSH 0.895 01/25/2024   T4TOTAL 9.1 05/22/2020   FREET4 1.36 01/25/2024   Last vitamin D  Lab Results  Component Value Date   VD25OH 56.8 06/17/2022   Last vitamin B12 and Folate Lab Results  Component Value Date   VITAMINB12 553 06/17/2022         Assessment & Plan:    Routine Health Maintenance and Physical Exam Immunization History  Administered Date(s) Administered    Astrazeneca Covid-19 Vaccine, Pf, 0.5 Ml Non Us   07/26/2020   INFLUENZA, HIGH DOSE SEASONAL PF 07/26/2020   Influenza, Seasonal, Injecte, Preservative Fre 03/31/2023, 07/04/2024   Influenza,inj,Quad PF,6+ Mos 08/06/2022   Influenza-Unspecified 03/22/2018   Janssen (J&J) SARS-COV-2 Vaccination 07/26/2020   Td 06/02/2016   Tdap 10/21/2005   Zoster Recombinant(Shingrix) 06/28/2021    Health Maintenance  Topic Date Due   Zoster Vaccines- Shingrix (2 of 2) 08/23/2021   COVID-19 Vaccine (2 - Janssen risk series) 07/20/2024 (Originally 08/23/2020)   Pneumococcal Vaccine: 50+ Years (1 of 1 - PCV) 07/04/2025 (Originally 02/09/2012)   Mammogram  08/11/2025   DTaP/Tdap/Td (3 - Td or Tdap) 06/02/2026   Cervical Cancer Screening (HPV/Pap Cotest)  07/02/2027   Colonoscopy  08/20/2033   Influenza Vaccine  Completed   Hepatitis C Screening  Completed   HIV Screening  Completed   Hepatitis B Vaccines 19-59 Average Risk  Aged Out   HPV VACCINES  Aged Out   Meningococcal B Vaccine  Aged Out    Discussed health benefits of physical activity, and encouraged her to engage in regular exercise appropriate for her age and condition.  Problem List Items Addressed This  Visit     Annual physical exam - Primary   Hypercholesterolemia   Relevant Orders   Lipid panel   Hypothyroidism   Relevant Orders   TSH + free T4   OSA on CPAP   Prediabetes   Primary hypertension   Primary insomnia   Relevant Medications   doxepin  (SINEQUAN ) 10 MG capsule   Ramsay Hunt cerebellar syndrome (HCC)   Relevant  Medications   gabapentin  (NEURONTIN ) 100 MG capsule   Restless legs syndrome (RLS)   Sjogren syndrome with dental involvement   Relevant Medications   gabapentin  (NEURONTIN ) 100 MG capsule   Other Visit Diagnoses       Immunization due       Relevant Orders   Flu vaccine trivalent PF, 6mos and older(Flulaval,Afluria,Fluarix,Fluzone) (Completed)     Tachycardia            Assessment & Plan Obstructive sleep apnea Chronic  Managed with CPAP. Reports improved sleep quality with new CPAP machine. - Continue CPAP therapy  Hypothyroidism due to acquired atrophy of thyroid  Chronic  Hypothyroidism with previous TSH levels low at 0.1. Reports symptoms of increased heart rate and sleep disturbances, possibly related to thyroid  levels. - Ordered TSH and T4 tests  Hypercholesterolemia Chronic  Requires updated lipid panel for management. - Ordered lipid panel  Prediabetes Previous A1c of 5.4 two months ago. No need for repeat A1c at this time.  Restless legs syndrome Reports no symptoms since starting treatment. - Continue current medication for restless legs syndrome  Primary insomnia Chronic  Difficulty maintaining sleep. Currently using Xanax  with partial relief. Discussed alternative options including doxepin  and Ambien. Concerns about side effects of sleep medications. - Prescribed doxepin  10 mg at bedtime - Continue Xanax  for anxiety as needed  Tachycardia Reports episodes of increased heart rate, possibly related to thyroid  dysfunction. Heart rate was 89 bpm during an episode. - Will recheck TSH and T4 to assess thyroid  function -consider ZioPatch monitor at follow up if symptoms persist   Status post right knee replacement November 12th. Reports good recovery and completion of physical therapy. No longer limping and experiencing improved mobility. - Continue follow-up with orthopedic surgeon for six-week checkup  General Health Maintenance Annual physical  examination conducted. Discussed lifestyle modifications including exercise and diet. Recommended vaccinations for Shingrix and influenza. - Recommended 150 minutes of moderate intensity exercise weekly - Advised well-balanced diet low in saturated fats and sugary beverages - Administered influenza vaccine - Recommended Shingrix vaccine    Return in about 4 months (around 11/02/2024) for Insomnia.    Rockie Agent, MD [facility]    "

## 2024-07-06 LAB — LIPID PANEL
Chol/HDL Ratio: 2.4 ratio (ref 0.0–4.4)
Cholesterol, Total: 159 mg/dL (ref 100–199)
HDL: 65 mg/dL
LDL Chol Calc (NIH): 77 mg/dL (ref 0–99)
Triglycerides: 92 mg/dL (ref 0–149)
VLDL Cholesterol Cal: 17 mg/dL (ref 5–40)

## 2024-07-06 LAB — TSH+FREE T4
Free T4: 1.3 ng/dL (ref 0.82–1.77)
TSH: 6.69 u[IU]/mL — ABNORMAL HIGH (ref 0.450–4.500)

## 2024-07-11 ENCOUNTER — Telehealth: Payer: Self-pay

## 2024-07-11 ENCOUNTER — Other Ambulatory Visit: Payer: Self-pay | Admitting: Family Medicine

## 2024-07-11 DIAGNOSIS — M62830 Muscle spasm of back: Secondary | ICD-10-CM

## 2024-07-11 DIAGNOSIS — E034 Atrophy of thyroid (acquired): Secondary | ICD-10-CM

## 2024-07-11 DIAGNOSIS — G2581 Restless legs syndrome: Secondary | ICD-10-CM

## 2024-07-11 NOTE — Telephone Encounter (Signed)
 Copied from CRM [id number]. Topic: Clinical - Lab/Test Results >> Jul 11, 2024  1:22 PM [name] wrote: Reason for CRM: Patient is calling to report that she was taking prednisone  along levothyroxine  (SYNTHROID ) 100 MCG tablet [[id number]] 1-2 weeks prior to lab test. This could have caused her results to be elevated.

## 2024-07-11 NOTE — Addendum Note (Signed)
 Addended by: CHERRY CHIQUITA HERO on: 07/11/2024 03:02 PM   Modules accepted: Orders

## 2024-07-11 NOTE — Telephone Encounter (Signed)
 Ok. We can plan to recheck in 6 weeks for lab only visit   Please order TSH and advise pt to return during lab hours

## 2024-07-11 NOTE — Telephone Encounter (Signed)
 LVM requesting patient call back, need to advise patient of message below. OK for e2c2 to advise patient is call is returned. Lab hours are walk in only M-F 8a-11:30a and 1p-430p

## 2024-07-15 ENCOUNTER — Ambulatory Visit: Payer: Self-pay | Admitting: Family Medicine

## 2024-07-29 ENCOUNTER — Other Ambulatory Visit (HOSPITAL_COMMUNITY): Payer: Self-pay

## 2024-08-19 ENCOUNTER — Ambulatory Visit: Payer: Self-pay | Admitting: Family Medicine

## 2024-08-19 LAB — TSH+FREE T4
Free T4: 1.31 ng/dL (ref 0.82–1.77)
TSH: 1.98 u[IU]/mL (ref 0.450–4.500)

## 2024-11-02 ENCOUNTER — Ambulatory Visit: Admitting: Family Medicine

## 2025-03-08 ENCOUNTER — Ambulatory Visit
